# Patient Record
Sex: Female | Born: 1935 | Race: White | Hispanic: No | Marital: Married | Smoking: Never smoker
Health system: Southern US, Community
[De-identification: ages and names within clinical notes are randomized; demographics above are authoritative.]

## PROBLEM LIST (undated history)

## (undated) DIAGNOSIS — Z8719 Personal history of other diseases of the digestive system: Secondary | ICD-10-CM

## (undated) DIAGNOSIS — M1990 Unspecified osteoarthritis, unspecified site: Secondary | ICD-10-CM

## (undated) DIAGNOSIS — E785 Hyperlipidemia, unspecified: Secondary | ICD-10-CM

## (undated) DIAGNOSIS — K449 Diaphragmatic hernia without obstruction or gangrene: Secondary | ICD-10-CM

## (undated) DIAGNOSIS — R05 Cough: Secondary | ICD-10-CM

## (undated) DIAGNOSIS — R059 Cough, unspecified: Secondary | ICD-10-CM

## (undated) DIAGNOSIS — K219 Gastro-esophageal reflux disease without esophagitis: Secondary | ICD-10-CM

## (undated) DIAGNOSIS — Q2730 Arteriovenous malformation, site unspecified: Secondary | ICD-10-CM

## (undated) DIAGNOSIS — Z87448 Personal history of other diseases of urinary system: Secondary | ICD-10-CM

## (undated) DIAGNOSIS — K5790 Diverticulosis of intestine, part unspecified, without perforation or abscess without bleeding: Secondary | ICD-10-CM

## (undated) DIAGNOSIS — K253 Acute gastric ulcer without hemorrhage or perforation: Secondary | ICD-10-CM

## (undated) DIAGNOSIS — Z860100 Personal history of colon polyps, unspecified: Secondary | ICD-10-CM

## (undated) DIAGNOSIS — I10 Essential (primary) hypertension: Secondary | ICD-10-CM

## (undated) DIAGNOSIS — D509 Iron deficiency anemia, unspecified: Secondary | ICD-10-CM

## (undated) DIAGNOSIS — K648 Other hemorrhoids: Secondary | ICD-10-CM

## (undated) DIAGNOSIS — Z8639 Personal history of other endocrine, nutritional and metabolic disease: Secondary | ICD-10-CM

## (undated) DIAGNOSIS — K440 Diaphragmatic hernia with obstruction, without gangrene: Principal | ICD-10-CM

## (undated) HISTORY — DX: Diaphragmatic hernia without obstruction or gangrene: K44.9

## (undated) HISTORY — DX: Essential (primary) hypertension: I10

## (undated) HISTORY — DX: Other hemorrhoids: K64.8

## (undated) HISTORY — DX: Diaphragmatic hernia with obstruction, without gangrene: K44.0

## (undated) HISTORY — PX: OTHER SURGICAL HISTORY: SHX169

## (undated) HISTORY — PX: DILATION AND CURETTAGE OF UTERUS: SHX78

## (undated) HISTORY — DX: Iron deficiency anemia, unspecified: D50.9

## (undated) HISTORY — DX: Arteriovenous malformation, site unspecified: Q27.30

## (undated) HISTORY — PX: POLYPECTOMY: SHX149

## (undated) HISTORY — DX: Gastro-esophageal reflux disease without esophagitis: K21.9

## (undated) HISTORY — DX: Cough, unspecified: R05.9

## (undated) HISTORY — DX: Diverticulosis of intestine, part unspecified, without perforation or abscess without bleeding: K57.90

## (undated) HISTORY — DX: Acute gastric ulcer without hemorrhage or perforation: K25.3

## (undated) HISTORY — DX: Cough: R05

---

## 1999-10-12 ENCOUNTER — Encounter: Admission: RE | Admit: 1999-10-12 | Discharge: 1999-10-12 | Payer: Self-pay | Admitting: Family Medicine

## 2000-07-07 ENCOUNTER — Other Ambulatory Visit: Admission: RE | Admit: 2000-07-07 | Discharge: 2000-07-07 | Payer: Self-pay | Admitting: Family Medicine

## 2001-05-17 ENCOUNTER — Encounter: Payer: Self-pay | Admitting: Family Medicine

## 2001-05-17 ENCOUNTER — Encounter: Admission: RE | Admit: 2001-05-17 | Discharge: 2001-05-17 | Payer: Self-pay | Admitting: Family Medicine

## 2001-10-26 ENCOUNTER — Encounter: Payer: Self-pay | Admitting: Family Medicine

## 2001-10-26 ENCOUNTER — Encounter: Admission: RE | Admit: 2001-10-26 | Discharge: 2001-10-26 | Payer: Self-pay | Admitting: Family Medicine

## 2002-01-18 ENCOUNTER — Encounter: Payer: Self-pay | Admitting: Gastroenterology

## 2003-12-09 ENCOUNTER — Other Ambulatory Visit: Admission: RE | Admit: 2003-12-09 | Discharge: 2003-12-09 | Payer: Self-pay | Admitting: Family Medicine

## 2004-11-09 ENCOUNTER — Ambulatory Visit: Payer: Self-pay | Admitting: Oncology

## 2006-02-03 ENCOUNTER — Ambulatory Visit: Payer: Self-pay | Admitting: Gastroenterology

## 2006-03-01 ENCOUNTER — Ambulatory Visit: Payer: Self-pay | Admitting: Gastroenterology

## 2006-05-04 ENCOUNTER — Ambulatory Visit: Payer: Self-pay | Admitting: Gastroenterology

## 2006-06-01 ENCOUNTER — Encounter: Payer: Self-pay | Admitting: Gastroenterology

## 2007-01-27 ENCOUNTER — Other Ambulatory Visit: Admission: RE | Admit: 2007-01-27 | Discharge: 2007-01-27 | Payer: Self-pay | Admitting: Family Medicine

## 2007-12-04 ENCOUNTER — Ambulatory Visit: Payer: Self-pay | Admitting: Gastroenterology

## 2007-12-04 LAB — CONVERTED CEMR LAB
Basophils Absolute: 0 10*3/uL (ref 0.0–0.1)
Basophils Relative: 0.8 % (ref 0.0–1.0)
Eosinophils Absolute: 0.1 10*3/uL (ref 0.0–0.6)
Eosinophils Relative: 1.7 % (ref 0.0–5.0)
HCT: 38.6 % (ref 36.0–46.0)
Hemoglobin: 13.5 g/dL (ref 12.0–15.0)
Lymphocytes Relative: 30 % (ref 12.0–46.0)
MCHC: 34.9 g/dL (ref 30.0–36.0)
MCV: 90.1 fL (ref 78.0–100.0)
Monocytes Absolute: 0.7 10*3/uL (ref 0.2–0.7)
Monocytes Relative: 13.8 % — ABNORMAL HIGH (ref 3.0–11.0)
Neutro Abs: 2.7 10*3/uL (ref 1.4–7.7)
Neutrophils Relative %: 53.7 % (ref 43.0–77.0)
Platelets: 270 10*3/uL (ref 150–400)
RBC: 4.28 M/uL (ref 3.87–5.11)
RDW: 12.1 % (ref 11.5–14.6)
WBC: 5 10*3/uL (ref 4.5–10.5)

## 2007-12-27 DIAGNOSIS — D509 Iron deficiency anemia, unspecified: Secondary | ICD-10-CM

## 2007-12-27 DIAGNOSIS — D649 Anemia, unspecified: Secondary | ICD-10-CM

## 2007-12-27 DIAGNOSIS — K219 Gastro-esophageal reflux disease without esophagitis: Secondary | ICD-10-CM | POA: Insufficient documentation

## 2007-12-27 DIAGNOSIS — Q279 Congenital malformation of peripheral vascular system, unspecified: Secondary | ICD-10-CM | POA: Insufficient documentation

## 2007-12-27 DIAGNOSIS — M810 Age-related osteoporosis without current pathological fracture: Secondary | ICD-10-CM | POA: Insufficient documentation

## 2007-12-27 DIAGNOSIS — I10 Essential (primary) hypertension: Secondary | ICD-10-CM

## 2007-12-27 DIAGNOSIS — K648 Other hemorrhoids: Secondary | ICD-10-CM | POA: Insufficient documentation

## 2009-04-21 ENCOUNTER — Telehealth: Payer: Self-pay | Admitting: Gastroenterology

## 2009-05-30 DIAGNOSIS — Z860100 Personal history of colon polyps, unspecified: Secondary | ICD-10-CM | POA: Insufficient documentation

## 2009-05-30 DIAGNOSIS — K5730 Diverticulosis of large intestine without perforation or abscess without bleeding: Secondary | ICD-10-CM | POA: Insufficient documentation

## 2009-06-02 ENCOUNTER — Ambulatory Visit: Payer: Self-pay | Admitting: Gastroenterology

## 2009-06-02 DIAGNOSIS — K5520 Angiodysplasia of colon without hemorrhage: Secondary | ICD-10-CM | POA: Insufficient documentation

## 2009-06-02 LAB — CONVERTED CEMR LAB
Basophils Absolute: 0 10*3/uL (ref 0.0–0.1)
Basophils Relative: 0.6 % (ref 0.0–3.0)
Eosinophils Absolute: 0.1 10*3/uL (ref 0.0–0.7)
Eosinophils Relative: 1.7 % (ref 0.0–5.0)
HCT: 38.1 % (ref 36.0–46.0)
Hemoglobin: 13.3 g/dL (ref 12.0–15.0)
Lymphocytes Relative: 25.3 % (ref 12.0–46.0)
Lymphs Abs: 1.6 10*3/uL (ref 0.7–4.0)
MCHC: 35 g/dL (ref 30.0–36.0)
MCV: 84.9 fL (ref 78.0–100.0)
Monocytes Absolute: 0.7 10*3/uL (ref 0.1–1.0)
Monocytes Relative: 10.6 % (ref 3.0–12.0)
Neutro Abs: 3.9 10*3/uL (ref 1.4–7.7)
Neutrophils Relative %: 61.8 % (ref 43.0–77.0)
Platelets: 283 10*3/uL (ref 150.0–400.0)
RBC: 4.48 M/uL (ref 3.87–5.11)
RDW: 13.3 % (ref 11.5–14.6)
WBC: 6.3 10*3/uL (ref 4.5–10.5)

## 2010-09-23 ENCOUNTER — Ambulatory Visit: Payer: Self-pay | Admitting: Gastroenterology

## 2010-09-23 DIAGNOSIS — K5900 Constipation, unspecified: Secondary | ICD-10-CM | POA: Insufficient documentation

## 2011-01-26 NOTE — Assessment & Plan Note (Signed)
Summary: med refills--ch.   History of Present Illness Visit Type: Follow-up Visit Primary GI MD: Elie Goody MD [facility] Primary Provider: Cam Hai, MD Chief Complaint: GERD, constipation, refill-omeprazole History of Present Illness:   This is a 77 year old female who has had chronic acid reflux problems that are well controlled on omeprazole. She discontinued medication and her symptoms returned. She has had mild constipation for several months. No change in stool caliber or bleeding. She underwent colonoscopy in 2007 that showed diverticulosis, hemorrhoids, and angiodysplasia.   GI Review of Systems    Reports acid reflux.      Denies abdominal pain, belching, bloating, chest pain, dysphagia with liquids, dysphagia with solids, heartburn, loss of appetite, nausea, vomiting, vomiting blood, weight loss, and  weight gain.      Reports change in bowel habits and  constipation.     Denies anal fissure, black tarry stools, diarrhea, diverticulosis, fecal incontinence, heme positive stool, hemorrhoids, irritable bowel syndrome, jaundice, light color stool, liver problems, rectal bleeding, and  rectal pain.   Current Medications (verified): 1)  Omeprazole 20 Mg Cpdr (Omeprazole) .... One Tablet By Mouth Once Daily 2)  Hydrochlorothiazide 25 Mg Tabs (Hydrochlorothiazide) .... Take 1 Tablet By Mouth Once A Day 3)  Metoprolol Tartrate 50 Mg Tabs (Metoprolol Tartrate) .... Take 1 Tablet By Mouth Once A Day 4)  Perphenazine-Amitriptyline 2-10 Mg Tabs (Perphenazine-Amitriptyline) .... Take 1 Tablet By Mouth Once A Day 5)  Multivitamins  Tabs (Multiple Vitamin) .... Take 1 Tablet By Mouth Once A Day 6)  Fish Oil 500 Mg Caps (Omega-3 Fatty Acids) .... Take 2 Tablets By Mouth Once Daily 7)  Simvastatin 20 Mg Tabs (Simvastatin) .... Once Daily  Allergies (verified): No Known Drug Allergies  Past History:  Past Medical History: Reviewed history from 06/02/2009 and no changes  required. DIVERTICULOSIS, COLON (ICD-562.10) OSTEOPOROSIS (ICD-733.00) HYPERTENSION (ICD-401.9) ANEMIA, IRON DEFICIENCY (ICD-280.9) CAMERON EROSIONS COLONIC ARTERIOVENOUS MALFORMATION  GERD (ICD-530.81) HEMORRHOIDS, INTERNAL (ICD-455.0) COLONIC POLYPS, HYPERPLASTIC, HX OF (ICD-V12.72)      Past Surgical History: Reviewed history from 06/02/2009 and no changes required. C-section  Family History: Reviewed history from 06/02/2009 and no changes required. No FH of Colon Cancer: Family History of Prostate Cancer: Brother  Social History: Reviewed history from 06/02/2009 and no changes required. Married Patient has never smoked.  Alcohol Use - yes-1-3 glasses daily Daily Caffeine Use-2 cups daily Illicit Drug Use - no Patient does not get regular exercise.   Review of Systems       The pertinent positives and negatives are noted as above and in the HPI. All other ROS were reviewed and were negative.   Vital Signs:  Patient profile:   77 year old female Height:      61.5 inches Weight:      157 pounds BMI:     29.29 Pulse rate:   84 / minute Pulse rhythm:   regular BP sitting:   124 / 78  (left arm) Cuff size:   regular  Vitals Entered By: June McMurray CMA Duncan Dull) (September 23, 2010 10:18 AM)  Physical Exam  General:  Well developed, well nourished, no acute distress. Head:  Normocephalic and atraumatic. Eyes:  PERRLA, no icterus. Mouth:  No deformity or lesions, dentition normal. Lungs:  Clear throughout to auscultation. Heart:  Regular rate and rhythm; no murmurs, rubs,  or bruits. Abdomen:  Soft, nontender and nondistended. No masses, hepatosplenomegaly or hernias noted. Normal bowel sounds. Psych:  Alert and cooperative. Normal mood and affect.  Impression & Recommendations:  Problem # 1:  GERD (ICD-530.81) Continue omeprazole 20 mg p.o. q.a.m. and standard antireflux measures. Advised her that she may have her primary care physician refill her omeprazole  prescription in lieu of annual office visits with me.  Problem # 2:  CONSTIPATION (ICD-564.00) Increase daily water and fiber intake. Consider the use of a stool softener and fiber supplement as needed. If her constipation does not respond to the above measures, she is advised to contact my office.  Patient Instructions: 1)  Pick up your prescription from your pharmacy.  2)  High Fiber, Low Fat  Healthy Eating Plan brochure given.  3)  Copy sent to : Cam Hai, MD 4)  The medication list was reviewed and reconciled.  All changed / newly prescribed medications were explained.  A complete medication list was provided to the patient / caregiver.  Prescriptions: OMEPRAZOLE 20 MG CPDR (OMEPRAZOLE) one tablet by mouth once daily  #30 x 11   Entered by:   Christie Nottingham CMA (AAMA)   Authorized by:   Meryl Dare MD [facility]   Signed by:   Christie Nottingham CMA (AAMA) on 09/23/2010   Method used:   Electronically to        [id number]       [address] [place], [place]  [address]       Ph: [phone]       Fax: [phone]   RxID:   [id number]

## 2011-04-18 ENCOUNTER — Emergency Department (HOSPITAL_COMMUNITY): Payer: Medicare Other

## 2011-04-18 ENCOUNTER — Inpatient Hospital Stay (HOSPITAL_COMMUNITY)
Admission: EM | Admit: 2011-04-18 | Discharge: 2011-04-21 | DRG: 392 | Disposition: A | Discharge status: Home / Self Care | Payer: Medicare Other | Attending: Internal Medicine | Admitting: Internal Medicine

## 2011-04-18 DIAGNOSIS — M810 Age-related osteoporosis without current pathological fracture: Secondary | ICD-10-CM | POA: Diagnosis present

## 2011-04-18 DIAGNOSIS — A09 Infectious gastroenteritis and colitis, unspecified: Principal | ICD-10-CM | POA: Diagnosis present

## 2011-04-18 DIAGNOSIS — Z860100 Personal history of colon polyps, unspecified: Secondary | ICD-10-CM

## 2011-04-18 DIAGNOSIS — Z79899 Other long term (current) drug therapy: Secondary | ICD-10-CM

## 2011-04-18 DIAGNOSIS — E876 Hypokalemia: Secondary | ICD-10-CM | POA: Diagnosis present

## 2011-04-18 DIAGNOSIS — K449 Diaphragmatic hernia without obstruction or gangrene: Secondary | ICD-10-CM | POA: Diagnosis present

## 2011-04-18 DIAGNOSIS — K648 Other hemorrhoids: Secondary | ICD-10-CM | POA: Diagnosis present

## 2011-04-18 DIAGNOSIS — K5730 Diverticulosis of large intestine without perforation or abscess without bleeding: Secondary | ICD-10-CM | POA: Diagnosis present

## 2011-04-18 DIAGNOSIS — F17200 Nicotine dependence, unspecified, uncomplicated: Secondary | ICD-10-CM | POA: Diagnosis present

## 2011-04-18 DIAGNOSIS — T39095A Adverse effect of salicylates, initial encounter: Secondary | ICD-10-CM | POA: Diagnosis present

## 2011-04-18 DIAGNOSIS — E785 Hyperlipidemia, unspecified: Secondary | ICD-10-CM | POA: Diagnosis present

## 2011-04-18 DIAGNOSIS — R7301 Impaired fasting glucose: Secondary | ICD-10-CM | POA: Diagnosis present

## 2011-04-18 DIAGNOSIS — D62 Acute posthemorrhagic anemia: Secondary | ICD-10-CM | POA: Diagnosis present

## 2011-04-18 DIAGNOSIS — K219 Gastro-esophageal reflux disease without esophagitis: Secondary | ICD-10-CM | POA: Diagnosis present

## 2011-04-18 DIAGNOSIS — R319 Hematuria, unspecified: Secondary | ICD-10-CM | POA: Diagnosis present

## 2011-04-18 DIAGNOSIS — I10 Essential (primary) hypertension: Secondary | ICD-10-CM | POA: Diagnosis present

## 2011-04-18 LAB — CBC
HCT: 42 % (ref 36.0–46.0)
Hemoglobin: 13.8 g/dL (ref 12.0–15.0)
MCV: 80.9 fL (ref 78.0–100.0)
RBC: 5.19 MIL/uL — ABNORMAL HIGH (ref 3.87–5.11)
WBC: 18.7 10*3/uL — ABNORMAL HIGH (ref 4.0–10.5)

## 2011-04-18 LAB — ABO/RH: ABO/RH(D): A POS

## 2011-04-18 LAB — URINALYSIS, ROUTINE W REFLEX MICROSCOPIC
Glucose, UA: NEGATIVE mg/dL
Nitrite: NEGATIVE
Protein, ur: NEGATIVE mg/dL
pH: 5.5 (ref 5.0–8.0)

## 2011-04-18 LAB — COMPREHENSIVE METABOLIC PANEL
Albumin: 3.8 g/dL (ref 3.5–5.2)
Alkaline Phosphatase: 80 U/L (ref 39–117)
BUN: 18 mg/dL (ref 6–23)
CO2: 26 mEq/L (ref 19–32)
Chloride: 98 mEq/L (ref 96–112)
Glucose, Bld: 160 mg/dL — ABNORMAL HIGH (ref 70–99)
Potassium: 3.3 mEq/L — ABNORMAL LOW (ref 3.5–5.1)
Total Bilirubin: 0.7 mg/dL (ref 0.3–1.2)

## 2011-04-18 LAB — PROTIME-INR
INR: 1.1 (ref 0.00–1.49)
Prothrombin Time: 14.4 seconds (ref 11.6–15.2)

## 2011-04-18 LAB — OCCULT BLOOD, POC DEVICE: Fecal Occult Bld: POSITIVE

## 2011-04-18 LAB — DIFFERENTIAL
Lymphocytes Relative: 3 % — ABNORMAL LOW (ref 12–46)
Lymphs Abs: 0.6 10*3/uL — ABNORMAL LOW (ref 0.7–4.0)
Neutrophils Relative %: 90 % — ABNORMAL HIGH (ref 43–77)

## 2011-04-18 LAB — URINE MICROSCOPIC-ADD ON

## 2011-04-19 ENCOUNTER — Encounter (HOSPITAL_COMMUNITY): Payer: Self-pay

## 2011-04-19 LAB — BASIC METABOLIC PANEL
CO2: 26 mEq/L (ref 19–32)
Calcium: 7.7 mg/dL — ABNORMAL LOW (ref 8.4–10.5)
Glucose, Bld: 208 mg/dL — ABNORMAL HIGH (ref 70–99)
Sodium: 131 mEq/L — ABNORMAL LOW (ref 135–145)

## 2011-04-19 LAB — HEMOGLOBIN AND HEMATOCRIT, BLOOD
HCT: 35.7 % — ABNORMAL LOW (ref 36.0–46.0)
Hemoglobin: 11.6 g/dL — ABNORMAL LOW (ref 12.0–15.0)

## 2011-04-19 LAB — HEMOGLOBIN A1C
Hgb A1c MFr Bld: 6.1 % — ABNORMAL HIGH (ref <5.7)
Mean Plasma Glucose: 128 mg/dL — ABNORMAL HIGH (ref <117)

## 2011-04-19 LAB — CBC
HCT: 36.9 % (ref 36.0–46.0)
Hemoglobin: 11.8 g/dL — ABNORMAL LOW (ref 12.0–15.0)
MCHC: 32 g/dL (ref 30.0–36.0)

## 2011-04-19 MED ORDER — IOHEXOL 300 MG/ML  SOLN
100.0000 mL | Freq: Once | INTRAMUSCULAR | Status: AC | PRN
  Administered 2011-04-19: 100 mL via INTRAVENOUS

## 2011-04-20 LAB — BASIC METABOLIC PANEL
GFR calc Af Amer: >60 mL/min (ref >60)
GFR calc non Af Amer: >60 mL/min (ref >60)
Potassium: 3.5 mEq/L (ref 3.5–5.1)
Sodium: 138 mEq/L (ref 135–145)

## 2011-04-20 LAB — CBC
HCT: 34.2 % — ABNORMAL LOW (ref 36.0–46.0)
Platelets: 224 10*3/uL (ref 150–400)
RDW: 14.9 % (ref 11.5–15.5)
WBC: 16 10*3/uL — ABNORMAL HIGH (ref 4.0–10.5)

## 2011-04-20 NOTE — H&P (Signed)
NAMEMCCALL, WILL             ACCOUNT NO.:  [id number]  MEDICAL RECORD NO.:  [id number]           PATIENT TYPE:  I  LOCATION:  1310                         FACILITY:  [facility]  PHYSICIAN:  Hillery Aldo, M.D.   DATE OF BIRTH:  1932-01-23  DATE OF ADMISSION:  04/18/2011 DATE OF DISCHARGE:                             HISTORY & PHYSICAL   PRIMARY CARE PHYSICIAN:  Dr. Cam Hai  GASTROENTEROLOGIST:  Venita Lick. Russella Dar, MD, [facility]  CHIEF COMPLAINTS:  Abdominal pain, loose stools, nausea.  HISTORY OF PRESENT ILLNESS:  The patient is a 77 year old female who was in her usual state of health until shortly after breakfast this morning. The patient notes that she had Eggs Benedict at [employer] and shortly thereafter developed crampy abdominal pain and profuse diarrhea.  The patient states that she has had 10-12 episodes of watery stools and most recently her stools have been bloody.  She apparently went to the [facility]-in [facility] earlier today and they advised her to come into the emergency department for further evaluation.  She denies any fever or chills.  There has not been any sick contacts.  She has been having dry heaving, but no frank vomiting.  With regard to her abdominal cramps, they are rated 7/10 at the worst and having bowel movements eases the pain temporarily.  Nothing specific worsens the pain.  Upon initial evaluation in the emergency department, the patient is found to have frank blood in the stools and leukocytosis and she is therefore being referred for inpatient evaluation.  PAST MEDICAL HISTORY: 1. Osteoporosis. 2. History of internal hemorrhoids. 3. History of hyperplastic colonic polyps. 4. Hypertension. 5. Gastroesophageal reflux disease. 6. Dyslipidemia. 7. History of iron deficiency anemia secondary to Cameron erosions and     AVMs. 8. Diverticulosis.  PAST SURGICAL HISTORY:  Cesarean section.  FAMILY HISTORY:  The patient's mother died at 47  from old age. According to the patient, she developed kidney problems that led to her demise.  The patient's father died at 34 from leukemia.  She has a brother who died from a lung infection in the setting of a known history of prostate cancer.  She has a sister who died of complications of Alzheimer disease.  She has a brother who is alive and well.  SOCIAL HISTORY:  The patient is married and lives with her husband. He can be reached at [phone] in case of emergency.  She is a lifelong nonsmoker.  She drinks wine, approximately 1-2 glasses per day.  She denies any drug use.  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  ALLERGIES:  No known drug allergies.  CURRENT MEDICATIONS:  The patient is unable to state her current medications and these will need to be further verified.  [facility] [address]  REVIEW OF SYSTEMS:  CONSTITUTIONAL:  No fever or chills.  No weight changes.  Appetite has been normal up until she became sick.  HEENT:  No complaints.  CARDIOVASCULAR:  No chest pain or palpitations. RESPIRATORY:  Shortness of breath or cough.  GI:  As per the elements of the HPI above.  She also has significant reflux-type symptoms and a burning sensation on the tongue.  GU:  No dysuria or hematuria.  Comprehensive 14-point review of systems is otherwise unremarkable.  PHYSICAL EXAMINATION:  VITAL SIGNS:  Temperature 97.8, pulse 88, respirations 20, blood pressure 141/69, O2 saturation 95% on room air. GENERAL:  A well-developed, well-nourished Caucasian female in no acute distress. HEENT:  Normocephalic, atraumatic.  PERRLA.  EOMI.  Oropharynx is clear. Mucous membranes are moist. NECK:  Supple.  No thyromegaly.  No lymphadenopathy.  No jugular venous distention. CHEST:  Lungs clear to auscultation bilaterally with good air movement. HEART:  Regular rate, rhythm.  No murmurs, rubs, or gallops. ABDOMEN:  Soft, slightly distended.  She is tender about the left  lower quadrant, but no guarding or rebound.  Hyperactive bowel sounds. RECTAL:  Deferred because it was done by the ED physician with reported grossly positive stool. EXTREMITIES:  1+ edema bilaterally. SKIN:  Warm and dry.  No rashes. NEUROLOGIC:  The patient is alert and oriented x3.  Cranial nerves II through XII grossly intact.  Nonfocal.  DATA REVIEW:  Fecal occult blood testing was positive.  White blood cell count was 18.7, hemoglobin 13.8, hematocrit 42, platelets 266.  Sodium is 135, potassium 3.3, chloride 98, bicarb 26, BUN 18, creatinine 0.96, glucose 160, calcium 9.1.  Total bilirubin 0.7, alkaline phosphatase 80, AST 42, ALT 27, total protein 7.9, albumin 3.8.  PT is 14.4.  Urinalysis is negative for nitrites with a small amount of leukocytes (0-2).  There are 21-50 red blood cells.  ASSESSMENT/PLAN: 1. Probable diverticulitis:  The patient likely has diverticulitis,     although acute food poisoning is also in the differential.  At this     point, would put her empirically on Cipro and Flagyl and obtain a     CT scan of the abdomen and pelvis for diagnostic purposes.  We will     treat her symptomatically with antiemetics and pain medications.     Would start her on a PPI and clear liquid diet. 2. Hematuria:  May be contamination from fecal blood.  We will check a     urine culture, but she is on Cipro, which would cover any urinary     tract infection. 3. Hypokalemia:  Replace the patient's potassium and her IV fluids and     check a magnesium level in the morning. 4. Hyperglycemia:  Check a fasting glucose level in the morning and a     hemoglobin A1c. 5. Hypertension:  We will hold the patient's antihypertensives until     these can be verified and significant gastrointestinal bleeding has     been ruled out. 6. Rectal bleeding:  We will place the patient on proton pump     inhibitor therapy and monitor her hemoglobin and hematocrit q.8 h.     x3.  Consider  consulting Dr. Russella Dar in the morning if she does     continue to have rectal bleeding. 7. Dyslipidemia:  The patient's statin is currently on hold until we     can verify her medication and dosage. 8. Gastroesophageal reflux disease:  Again, we will place the patient     on IV proton pump inhibitor therapy. 9. Prophylaxis:  Use PAS hoses for deep venous thrombosis prophylaxis,     given the patient's active gastrointestinal bleeding.  Time spent on admission including face-to-face time approximately 1 hour.     Hillery Aldo, M.D.     CR/MEDQ  D:  04/18/2011  T:  04/19/2011  Job:  [id number]  cc:   Dr. Aura Dials. Russella Dar, MD, FACG [address] [place] [address]  Electronically Signed by Hillery Aldo M.D. on 04/20/2011 04:06:23 PM

## 2011-04-21 LAB — BASIC METABOLIC PANEL
CO2: 27 mEq/L (ref 19–32)
GFR calc non Af Amer: >60 mL/min (ref >60)
Glucose, Bld: 96 mg/dL (ref 70–99)
Potassium: 3.4 mEq/L — ABNORMAL LOW (ref 3.5–5.1)
Sodium: 138 mEq/L (ref 135–145)

## 2011-04-21 LAB — CBC
HCT: 30.7 % — ABNORMAL LOW (ref 36.0–46.0)
Hemoglobin: 9.8 g/dL — ABNORMAL LOW (ref 12.0–15.0)
WBC: 10.4 10*3/uL (ref 4.0–10.5)

## 2011-04-27 NOTE — Discharge Summary (Signed)
NAMEJOCI, DRESS             ACCOUNT NO.:  [id number]  MEDICAL RECORD NO.:  [id number]           PATIENT TYPE:  I  LOCATION:  1310                         [facility]ACILITY:  [facility]  PHYSICIAN:  Hillery Aldo, M.D.   DATE OF BIRTH:  13-Mar-1932  DATE OF ADMISSION:  04/18/2011 DATE OF DISCHARGE:  04/21/2011                              DISCHARGE SUMMARY   PRIMARY CARE PHYSICIAN:  Lupita Raider, M.D.  GASTROENTEROLOGIST:  Venita Lick. Russella Dar, MD, [facility]  DISCHARGE DIAGNOSES: 1. Colitis. 2. Rectal bleeding with acute blood loss anemia. 3. Hematuria. 4. Hypokalemia. 5. Hyperglycemia. 6. Hypertension. 7. Dyslipidemia. 8. Gastroesophageal reflux disease. 9. Large paraesophageal hernia.  DISCHARGE MEDICATIONS.: 1. Cipro 500 mg p.o. q.12 h x10 days. 2. Flagyl 500 mg p.o. q.8 h x10 days. 3. Metoprolol succinate 50 mg p.o. daily. 4. Perphenazine/amitriptyline 2/10 mg 2 tablets p.o. q.h.s. 5. Hydrochlorothiazide 25 mg 1/2 tablet p.o. daily. 6. Simvastatin 20 mg p.o. q.h.s. 7. Omeprazole 20 mg p.o. daily. 8. Potassium chloride 20 mEq p.o. daily.  Note:  The patient was instructed to hold her aspirin until there is no evidence of any further rectal bleeding and she follows up with Dr. Clelia Croft for a repeat check of her blood counts.  CONSULTATIONS:  None.  BRIEF ADMISSION HPI:  The patient is a 77 year old female who presented to the hospital with the acute onset of abdominal pain accompanied by diarrhea and nausea.  Upon initial evaluation in the emergency department, the patient was thought to have colitis versus diverticulitis.  She subsequently was referred to the hospitalist service for further evaluation and treatment.  For full details, please see my dictated report.  PROCEDURES AND DIAGNOSTIC STUDIES: 1. CT scan of the abdomen and pelvis on April 19, 2011, showed colitis     involving the descending and proximal sigmoid colon with diffuse     wall thickening and soft tissue  stranding and trace fluid in the     pelvis which could be infectious, inflammatory, or ischemic in     nature.  Diverticulosis along the sigmoid colon without any     evidence for diverticulitis.  A large paraesophageal hernia filled     with contrast and air.  Multiple prominent right renal cysts.  Mild     partial consolidation of both lower lobes reflective of the     patient's paraesophageal hernia.  Mild degenerative changes noted     along the lumbar spine.  DISCHARGE LABORATORY VALUES:  Sodium was 138, potassium 3.4 (repleted with 40 mEq prior to discharge), chloride 107, bicarb 27, BUN 4, creatinine 0.74, glucose 96, calcium 7.8.  White blood cell count was 10.4, hemoglobin 9.8, hematocrit 30.7, platelets 196.  Hemoglobin A1c was 6.1.  Clostridium difficile by PCR was negative.  Liver function studies done on admission were within normal limits.  HOSPITAL COURSE BY PROBLEM: 1. Colitis:  The patient's colitis was felt to be infectious in     etiology given her leukocytosis.  She was put on IV Cipro and     Flagyl as well as bowel rest.  With improvement in her clinical     presentation,  her diet was advanced and she was changed over to     p.o. Cipro and Flagyl.  CT scan was done which confirmed the     diagnosis of colitis.  At this point, the patient has completed 4     out of a planned course of 14 days of Cipro and Flagyl.  She is     encouraged to follow up with her primary care physician as well as     with Dr. Russella Dar for nonresolution of symptoms if appropriate. 2. Rectal bleeding/acute blood loss anemia:  The patient did have some     mild complaints of rectal bleeding.  Her admission hemoglobin was     within the normal range at 13.8 and over the course of her hospital     stay, she has trended down to her current hemoglobin of 9.8.  This     4 g drop since admission was felt to be due to GI losses in the     setting of recent aspirin exposure as well as a component  of     dilutional factors.  At this point, the patient reports that her     stools have become less bloody and have decreased in frequency and     volume.  She is asymptomatic and has not needed a transfusion.  At     this point, it is safe to discharge her with a followup CBC done in     her physician's office in the next week. 3. Hematuria:  Although urine cultures were requested, they were never     sent to rule out a UTI.  Cipro should cover urinary pathogens at     present.  At this point, would simply have her primary care     physician repeat a urinalysis when she is fully recovered from her     colitis as the hematuria may have been contamination from the     stool. 4. Hypokalemia:  The patient is on hydrochlorothiazide at baseline and     with her ongoing potential GI issues, would start her on routine     supplementation therapy.  She will be discharged on 20 mEq daily     and repleted with 40 mEq prior to discharge. 5. Hyperglycemia/impaired fasting glucose:  The patient did have a     hemoglobin A1c drawn due to her elevated blood glucoses.  She did     have an elevated hemoglobin A1c at 6.1% which corresponds for mean     plasma glucose of 128 indicating that she does have impaired     fasting glucose.  She will need close followup to ensure she does     not develop signs of overt diabetes. 6. Hypertension:  The patient's blood pressure is currently stable and     her home medicines have been resumed. 7. Dyslipidemia:  Statin has initially on hold.  It was resumed once     she was able to take p.o.'s. 8. Gastroesophageal reflux disease/paraesophageal hernia:  The patient     was maintained on proton pump inhibitor therapy.  DISPOSITION:  The patient is medically stable and will be discharged home with encouragement to follow up with her primary care physician in 5 days.  DISCHARGE DIET:  Low concentrated sweets, heart-healthy.  CONDITION ON DISCHARGE:   Improved.  Time spent coordinating care for discharge and discharge instructions equals 35 minutes.     Hillery Aldo, M.D.  CR/MEDQ  D:  04/21/2011  T:  04/21/2011  Job:  [id number]  cc:   Lupita Raider, M.D. Fax: [phone]  Venita Lick. Russella Dar, MD, FACG [address] [place] [address]  Electronically Signed by Hillery Aldo M.D. on 04/27/2011 07:00:28 AM

## 2011-05-11 NOTE — Assessment & Plan Note (Signed)
[facility]                         GASTROENTEROLOGY OFFICE NOTE   NAME:[name], Brooke HAPKE                    MRN:          [id number]  DATE:12/04/2007                            DOB:          1932/04/10    This is a return office visit for GERD and a history of iron-deficiency  anemia.  She notes a nocturnal cough that generally occurs upon  recumbency.  She has not been using bed blocks and she frequently eats  late in the evening.  It worsened when she was recently on vacation in  the [place].  She notes no dysphagia, odynophagia, heartburn, abdominal  pain, chest pain, melena or hematochezia.  She notes her stools are  occasionally dark brown but no black or tarry stools.  She has a history  of iron-deficiency anemia secondary to [facility] erosions and AVMs.  She  has not recently had a CBC.   CURRENT MEDICATIONS:  Listed on the chart, updated and reviewed.   ALLERGIES:  No known drug allergies.   PHYSICAL EXAMINATION:  VITAL SIGNS:  Weight 157.4 pounds, blood pressure  124/74, pulse 72 and regular.  GENERAL APPEARANCE:  No acute distress.  CHEST:  Clear to auscultation bilaterally.  CARDIOVASCULAR:  Regular rate and rhythm without murmurs.  ABDOMEN:  Soft and nontender with normal active bowel sounds.   ASSESSMENT/PLAN:  1. Gastroesophageal reflux disease with a frequent recumbent cough      which I suspect she is reflux related.  She is advised to begin the      use of four inch bed blocks and to avoid any food or drink for      three to four hours before bedtime.  Omeprazole 40 mg p.o. q.a.m.      long-term.  2. History of iron-deficiency anemia.  Obtain CBC today.  I would      suggest that she have CBCs performed routinely every six months by      her primary care physician, with the expectation that she could      likely have a recurrent iron deficiency.  Return office visit with      me in 12 months.     Venita Lick. Russella Dar, MD, [facility]  Electronically Signed    MTS/MedQ  DD: 12/04/2007  DT: 12/04/2007  Job #: [id number]   cc:   Donia Guiles, M.D.

## 2011-08-23 ENCOUNTER — Encounter: Payer: Self-pay | Admitting: Gastroenterology

## 2011-08-23 ENCOUNTER — Ambulatory Visit (INDEPENDENT_AMBULATORY_CARE_PROVIDER_SITE_OTHER): Payer: Medicare Other | Admitting: Gastroenterology

## 2011-08-23 VITALS — BP 112/68 | HR 78 | Ht 61.5 in | Wt 154.6 lb

## 2011-08-23 DIAGNOSIS — K219 Gastro-esophageal reflux disease without esophagitis: Secondary | ICD-10-CM

## 2011-08-23 DIAGNOSIS — R079 Chest pain, unspecified: Secondary | ICD-10-CM

## 2011-08-23 DIAGNOSIS — K5900 Constipation, unspecified: Secondary | ICD-10-CM

## 2011-08-23 NOTE — Patient Instructions (Signed)
If you decide to schedule the Upper GI series, Esophageal Manometry and Upper Endoscopy, please call our main number. Ask to speak to [facility] and we can set these tests up for you.  Stay on reflux medications. Patient advised to avoid spicy, acidic, citrus, chocolate, mints, fruit and fruit juices.  Limit the intake of caffeine, alcohol and Soda.  Don't exercise too soon after eating.  Don't lie down within 3-4 hours of eating.  Elevate the head of your bed. High Fiber diet given.  cc: Cam Hai, MD

## 2011-08-23 NOTE — Progress Notes (Signed)
History of Present Illness: This is a  77 year old female here today with her husband. She was hospitalized at [facility] for about 3 days earlier this month for severe chest pain and epigastric pain associated with nausea and vomiting. She states she had lunch at [employer] and developed severe lower sternal area chest pain and was taken by ambulance to [facility]. I do not have records except for a CT scan of the abdomen/pelvis and abdominal ultrasound. Imaging studies were remarkable for a large hiatal hernia, with most of the stomach in her chest and a left lower lobe infiltrate with a small effusion. Apparently cardiopulmonary and other etiologies were excluded and her physicians were concerned that her symptoms were due to her large hiatal hernia. She has had no chest pain since discharge. She denies any reflux symptoms or dysphagia. She has had ongoing difficulties with mild constipation that have responded well to milk of magnesia. She underwent upper endoscopy and colonoscopy in March 2007 showing a large hiatal hernia with Sheria Lang erosions on her upper endoscopy and AVMs, diverticulosis and hemorrhoids on her colonoscopy. Currently denies weight loss, abdominal pain, diarrhea, change in stool caliber, melena, hematochezia, nausea, vomiting, dysphagia, reflux symptoms, chest pain.  Past Medical History  Diagnosis Date  . Diverticulosis   . Osteoporosis   . Hypertension   . Iron deficiency anemia   . Cameron lesion, acute   . AVM (arteriovenous malformation)   . GERD (gastroesophageal reflux disease)   . Internal hemorrhoid   . Hiatal hernia   . Diverticulosis    Past Surgical History  Procedure Date  . Cesarean section     reports that she has never smoked. She does not have any smokeless tobacco history on file. She reports that she drinks alcohol. She reports that she does not use illicit drugs. family history includes Prostate cancer in her brother.  There is no  history of Colon cancer. No Known Allergies    Outpatient Encounter Prescriptions as of 08/23/2011  Medication Sig Dispense Refill  . hydrochlorothiazide 25 MG tablet Take 25 mg by mouth daily.        . metoprolol (LOPRESSOR) 50 MG tablet Take 50 mg by mouth daily.        . Multiple Vitamin (MULTIVITAMIN) tablet Take 1 tablet by mouth daily.        . Omega-3 Fatty Acids (FISH OIL) 500 MG CAPS Take 2 capsules by mouth daily.        Marland Kitchen omeprazole (PRILOSEC) 20 MG capsule Take 20 mg by mouth daily.        Marland Kitchen perphenazine-amitriptyline (ETRAFON/TRIAVIL) 2-10 MG TABS Take 2 tablets by mouth daily.       . simvastatin (ZOCOR) 20 MG tablet Take 20 mg by mouth at bedtime.         Review of Systems: Pertinent positive and negative review of systems were noted in the above HPI section. All other review of systems were otherwise negative.  Physical Exam: General: Well developed , well nourished, no acute distress Head: Normocephalic and atraumatic Eyes:  sclerae anicteric, EOMI Ears: Normal auditory acuity Mouth: No deformity or lesions Neck: Supple, no masses or thyromegaly Lungs: Clear throughout to auscultation Heart: Regular rate and rhythm; no murmurs, rubs or bruits Abdomen: Soft, non tender and non distended. No masses, hepatosplenomegaly or hernias noted. Normal Bowel sounds Musculoskeletal: Symmetrical with no gross deformities  Skin: No lesions on visible extremities Pulses:  Normal pulses noted Extremities: No clubbing, cyanosis, edema  or deformities noted Neurological: Alert oriented x 4, grossly nonfocal Cervical Nodes:  No significant cervical adenopathy Inguinal Nodes: No significant inguinal adenopathy Psychological:  Alert and cooperative. Normal mood and affect  Assessment and Recommendations:  1. Severe lower chest and epigastric pain occurring one month ago. I would like to review additional records from her hospitalization and we have requested them. I am not certain that  her large hiatal hernia was the cause of her symptoms but she could have had torsion of the hernia or a gastric volvulus. I advised proceeding with surgical consultation however she is unsure she wants to proceed at this point. After contacting [facility], we were informed that they will require an upper endoscopy, upper GI series and esophageal manometry study prior to her being evaluated for possible hiatal hernia repair. Patient will consider further workup and contact me if she decides to proceed.  2. GERD with a large hiatal hernia and a history of Cameron erosions. Continue omeprazole 20 mg daily and standard antireflux measures.  3. History of iron deficiency anemia presumed secondary to [facility] erosions and AVMs.

## 2011-09-06 ENCOUNTER — Ambulatory Visit (INDEPENDENT_AMBULATORY_CARE_PROVIDER_SITE_OTHER): Payer: Medicare Other | Admitting: Surgery

## 2011-09-13 ENCOUNTER — Telehealth: Payer: Self-pay | Admitting: Gastroenterology

## 2011-09-13 DIAGNOSIS — R1013 Epigastric pain: Secondary | ICD-10-CM

## 2011-09-13 DIAGNOSIS — K219 Gastro-esophageal reflux disease without esophagitis: Secondary | ICD-10-CM

## 2011-09-14 NOTE — Telephone Encounter (Signed)
Left a message for patient to return my call. 

## 2011-09-15 ENCOUNTER — Encounter: Payer: Self-pay | Admitting: Gastroenterology

## 2011-09-15 NOTE — Telephone Encounter (Addendum)
Spoke with patient and she states she would like to have the testing that her and Dr. Russella Dar discussed in the office done. I told her that I can schedule them all today and call her this afternoon with the appt dates and times. Pt agreed.

## 2011-09-15 NOTE — Telephone Encounter (Addendum)
Scheduled patient for UGI series on 09/17/11 at 9:30 at [facility], Esophageal Manometry on 10/04/11 at 11:30am, and a Upper Endoscopy with previsit on 10/06/11 at 2:00pm. Mailed all the information to patient regarding appt dates and times with a letter to notify her to call if she needs to reschedule or cancel any of these tests. Informed patient about test starting this Friday and for her to call back if she has any questions once she has received the packet in the mail. Pt agreed and verbalized understanding.

## 2011-09-17 ENCOUNTER — Ambulatory Visit (HOSPITAL_COMMUNITY)
Admission: RE | Admit: 2011-09-17 | Discharge: 2011-09-17 | Disposition: A | Discharge status: Home / Self Care | Payer: Medicare Other | Source: Ambulatory Visit | Attending: Gastroenterology | Admitting: Gastroenterology

## 2011-09-17 DIAGNOSIS — K224 Dyskinesia of esophagus: Secondary | ICD-10-CM | POA: Insufficient documentation

## 2011-09-17 DIAGNOSIS — R0789 Other chest pain: Secondary | ICD-10-CM | POA: Insufficient documentation

## 2011-09-17 DIAGNOSIS — K219 Gastro-esophageal reflux disease without esophagitis: Secondary | ICD-10-CM | POA: Insufficient documentation

## 2011-09-17 DIAGNOSIS — R1013 Epigastric pain: Secondary | ICD-10-CM | POA: Insufficient documentation

## 2011-09-17 DIAGNOSIS — K449 Diaphragmatic hernia without obstruction or gangrene: Secondary | ICD-10-CM | POA: Insufficient documentation

## 2011-09-29 ENCOUNTER — Ambulatory Visit (AMBULATORY_SURGERY_CENTER): Payer: Medicare Other | Admitting: *Deleted

## 2011-09-29 ENCOUNTER — Telehealth: Payer: Self-pay | Admitting: Gastroenterology

## 2011-09-29 VITALS — Ht 63.0 in | Wt 155.5 lb

## 2011-09-29 DIAGNOSIS — R1013 Epigastric pain: Secondary | ICD-10-CM

## 2011-09-29 DIAGNOSIS — K219 Gastro-esophageal reflux disease without esophagitis: Secondary | ICD-10-CM

## 2011-09-29 NOTE — Telephone Encounter (Signed)
Pt wanted to make sure her dates were correct for her esophageal manometry and the Upper Endoscopy. Went over instructions again over the phone and pt verbalized understanding.

## 2011-10-04 ENCOUNTER — Other Ambulatory Visit: Payer: Self-pay | Admitting: Gastroenterology

## 2011-10-05 ENCOUNTER — Ambulatory Visit (HOSPITAL_COMMUNITY)
Admission: RE | Admit: 2011-10-05 | Discharge: 2011-10-05 | Disposition: A | Discharge status: Home / Self Care | Payer: Medicare Other | Source: Ambulatory Visit | Attending: Gastroenterology | Admitting: Gastroenterology

## 2011-10-05 DIAGNOSIS — K219 Gastro-esophageal reflux disease without esophagitis: Secondary | ICD-10-CM | POA: Insufficient documentation

## 2011-10-05 DIAGNOSIS — K449 Diaphragmatic hernia without obstruction or gangrene: Secondary | ICD-10-CM | POA: Insufficient documentation

## 2011-10-05 DIAGNOSIS — R0789 Other chest pain: Secondary | ICD-10-CM | POA: Insufficient documentation

## 2011-10-06 ENCOUNTER — Ambulatory Visit (AMBULATORY_SURGERY_CENTER): Payer: Medicare Other | Admitting: Gastroenterology

## 2011-10-06 ENCOUNTER — Telehealth: Payer: Self-pay

## 2011-10-06 ENCOUNTER — Encounter: Payer: Self-pay | Admitting: Gastroenterology

## 2011-10-06 DIAGNOSIS — K219 Gastro-esophageal reflux disease without esophagitis: Secondary | ICD-10-CM

## 2011-10-06 DIAGNOSIS — R079 Chest pain, unspecified: Secondary | ICD-10-CM

## 2011-10-06 DIAGNOSIS — K449 Diaphragmatic hernia without obstruction or gangrene: Secondary | ICD-10-CM

## 2011-10-06 MED ORDER — SODIUM CHLORIDE 0.9 % IV SOLN: 500.0000 mL | INTRAVENOUS | Status: DC

## 2011-10-06 NOTE — Telephone Encounter (Signed)
Patient scheduled to see Dr. Michaell Cowing 10/20/11 at 10:15am, needs to arrive at 9:55am;  Patient advised of information about appointment.

## 2011-10-06 NOTE — Progress Notes (Signed)
Pt snoring loudly upon arrival to the recovery room.  Will not respond to stimuli; sternal rub done, cold clothes applied to forehead.  Ammonia used for pt to smell for pt to attempt to wake up.  After ammonia used, pt did open eyes momentarily but closed eyes right away.  Oxygen on at 6/Liters per nasal cannula upon arrival to RR. Unable to get an accurate oxygen saturation- 72-79%  Pt. Skin color pink, warm and dry.  Attempted to place probe onto ear, unable to still get an accurate reading.  Probe placed sideways on finger and sats 93%  1450- O2 at 4/Liters- sats 97%  1500- O2 at 2/Liters- sats 95%  1505- Pt on room air.  Sats 94-98%.  Pt awake at this time.

## 2011-10-06 NOTE — Patient Instructions (Signed)
Please refer to discharge instructions (blue and green sheets)  Resume your regular medications  Dr. Ardell Isaacs office will call you to set up an appointment with a surgeon to treat your hiatal hernia

## 2011-10-07 ENCOUNTER — Telehealth: Payer: Self-pay | Admitting: *Deleted

## 2011-10-07 NOTE — Telephone Encounter (Signed)

## 2011-10-15 NOTE — Consult Note (Signed)
[name], [name]             ACCOUNT NO.:  [id number]  MEDICAL RECORD NO.:  [id number]  LOCATION:  [facility]                         FACILITY:  [facility]  PHYSICIAN:  Jersey Espinoza T. Russella Dar, MD, FACGDATE OF BIRTH:  Mar 09, 1932  DATE OF CONSULTATION:  10/14/2011 DATE OF DISCHARGE:  10/05/2011                                CONSULTATION   Esophageal Manometry Report  RESULTS:  Upper esophageal sphincter revealed normal pressures and normal relaxation.  Esophageal body showed 82% of swallows with peristaltic contraction, 9% of swallow simultaneous, 9% non-transmitted.  The pressures were normal except for channel 3 which had a low-pressure at 20 mmHg.  Lower esophageal sphincter study revealed a resting pressure of 23.7 mmHg, which was normal, 63% relaxation, and a mildly elevated residual pressure at 10.2 mmHg with normal being less than 8 mmHg.  IMPRESSION:  Essentially normal manometry study with a minimally elevated lower esophageal sphincter residual pressure.  RECOMMENDATIONS:  I do not see a contraindication on this study to performing repair of her paraesophageal hernia.     Venita Lick. Russella Dar, MD, [facility]     MTS/MEDQ  D:  10/14/2011  T:  10/15/2011  Job:  [id number]

## 2011-10-20 ENCOUNTER — Encounter: Payer: Self-pay | Admitting: Gastroenterology

## 2011-10-20 ENCOUNTER — Encounter (INDEPENDENT_AMBULATORY_CARE_PROVIDER_SITE_OTHER): Payer: Self-pay | Admitting: Surgery

## 2011-10-20 ENCOUNTER — Ambulatory Visit (INDEPENDENT_AMBULATORY_CARE_PROVIDER_SITE_OTHER): Payer: Medicare Other | Admitting: Surgery

## 2011-10-20 VITALS — BP 126/74 | HR 64 | Temp 96.9°F | Resp 14 | Ht 60.5 in | Wt 154.6 lb

## 2011-10-20 DIAGNOSIS — K440 Diaphragmatic hernia with obstruction, without gangrene: Secondary | ICD-10-CM

## 2011-10-20 DIAGNOSIS — D509 Iron deficiency anemia, unspecified: Secondary | ICD-10-CM

## 2011-10-20 HISTORY — DX: Diaphragmatic hernia with obstruction, without gangrene: K44.0

## 2011-10-20 NOTE — Progress Notes (Signed)
Subjective:     Patient ID: Brooke [name], female   DOB: 16-May-1932, 77 y.o.   MRN: [id number]  HPI  Patient Care Team: Cam Hai, CNM as PCP - General (Obstetrics and Gynecology) Meryl Dare, MD,FACG as Consulting Physician (Gastroenterology)  This patient is a 77 y.o.female who presents today for surgical evaluation.   Reason for visit: Giant paraesophageal hiatal hernia with episode of severe noncardiac chest pain and Cameron ulcerations. Consider surgery.  Patient has a history of chronic reflux that is controlled with diet changes and omeprazole. She's had intermittent episodes of GI blood loss. There's been a question of arterial venous malformations in the colon versus colitis. This was in spring 2012. In the summer time, she had an episode of severe shortness of breath and pressure and pain in her chest. She was sent to the emergency room at [facility]. There was concern of a heart attack on arrival, but workup was negative for any cardiopulmonary etiology. Suspicion of the hiatal hernia causing the problem was raised.  She's been followed by [facility] gastroenterology and more specifically Dr. Claudette Head. He did evaluation including an upper GI and endoscopy that showed very large paraesophageal hiatal hernia with most of the stomach up in the chest. She's had manometry that shows no esophageal dysmotility.  Based on the diagnosis of Sheria Lang ulcers and chronic iron deficiency anemia as well as this episode of severe noncardiac chest pain, she was sent to me to consider her surgical repair of her paraesophageal hiatal hernia.  Past Medical History  Diagnosis Date  . Diverticulosis   . Osteoporosis   . Hypertension   . Iron deficiency anemia   . Cameron lesion, acute   . AVM (arteriovenous malformation)   . GERD (gastroesophageal reflux disease)   . Internal hemorrhoid   . Hiatal hernia   . Diverticulosis   . Cough   . Paraesophageal hernia      Past Surgical History  Procedure Date  . Colonscopy   . Polypectomy   . Cesarean section 01/1965    History   Social History  . Marital Status: Married    Spouse Name: N/A    Number of Children: 1  . Years of Education: N/A   Occupational History  . Business owner     [employer]   Social History Main Topics  . Smoking status: Never Smoker   . Smokeless tobacco: Never Used  . Alcohol Use: 7.2 oz/week    12 Glasses of wine per week     1 glasses daily  . Drug Use: No  . Sexually Active: Not on file   Other Topics Concern  . Not on file   Social History Narrative  . No narrative on file    Family History  Problem Relation Age of Onset  . Prostate cancer Brother   . Colon cancer Neg Hx     Current outpatient prescriptions:hydrochlorothiazide 25 MG tablet, Take 25 mg by mouth daily.  , Disp: , Rfl: ;  metoprolol (LOPRESSOR) 50 MG tablet, Take 50 mg by mouth daily.  , Disp: , Rfl: ;  omeprazole (PRILOSEC) 20 MG capsule, TAKE ONE CAPSULE BY MOUTH EVERY DAY, Disp: 30 capsule, Rfl: 10;  perphenazine-amitriptyline (ETRAFON/TRIAVIL) 2-10 MG TABS, Take 2 tablets by mouth daily. , Disp: , Rfl:  simvastatin (ZOCOR) 20 MG tablet, Take 20 mg by mouth at bedtime.  , Disp: , Rfl:   No Known Allergies     Review of  Systems  Constitutional: Negative for fever, chills, diaphoresis, appetite change and fatigue.  HENT: Negative for ear pain, sore throat, trouble swallowing, neck pain and ear discharge.   Eyes: Negative for photophobia, discharge and visual disturbance.  Respiratory: Positive for cough. Negative for apnea, choking, chest tightness, shortness of breath, wheezing and stridor.   Cardiovascular: Positive for chest pain. Negative for palpitations and leg swelling.       Walks 30 minutes w/o difficulty.  No history of cardiac disease.  Gastrointestinal: Positive for constipation. Negative for nausea, vomiting, abdominal pain, diarrhea, abdominal distention, anal  bleeding and rectal pain.       Mod/Severe reflux - PPIs help control.  Occasional dysphagia - mild.  BM q2-3 days  Genitourinary: Negative for dysuria, frequency and difficulty urinating.  Musculoskeletal: Negative for myalgias and gait problem.  Skin: Negative for color change, pallor, rash and wound.  Neurological: Negative for dizziness, speech difficulty, weakness and numbness.  Hematological: Negative for adenopathy.  Psychiatric/Behavioral: Negative for confusion and agitation. The patient is not nervous/anxious.        Objective:   Physical Exam  Constitutional: She is oriented to person, place, and time. She appears well-developed and well-nourished. No distress.  HENT:  Head: Normocephalic.  Mouth/Throat: Oropharynx is clear and moist. No oropharyngeal exudate.  Eyes: Conjunctivae and EOM are normal. Pupils are equal, round, and reactive to light. No scleral icterus.  Neck: Normal range of motion. Neck supple. No tracheal deviation present.  Cardiovascular: Normal rate, regular rhythm and intact distal pulses.   Pulmonary/Chest: Effort normal and breath sounds normal. No respiratory distress. She has no wheezes. She has no rales. She exhibits no tenderness.  Abdominal: Soft. She exhibits no distension and no mass. There is no tenderness. There is no rebound and no guarding. Hernia confirmed negative in the right inguinal area and confirmed negative in the left inguinal area.  Genitourinary: No vaginal discharge found.  Musculoskeletal: Normal range of motion. She exhibits no tenderness.  Lymphadenopathy:    She has no cervical adenopathy.       Right: No inguinal adenopathy present.       Left: No inguinal adenopathy present.  Neurological: She is alert and oriented to person, place, and time. No cranial nerve deficit. She exhibits normal muscle tone. Coordination normal.  Skin: Skin is warm and dry. No rash noted. She is not diaphoretic. No erythema.  Psychiatric: She has a  normal mood and affect. Her behavior is normal. Judgment and thought content normal.   Studies as noted above.  Upper GI shows a large paraesophageal hiatal hernia with most of the stomach appeared chest.  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 No obstructing mass. Large hiatal hernia.  Esophageal manometry shows no major dysmotility. One amplitude in the mid esophagus was low, but I agree with Dr. Russella Dar that motility is not abnormal. No nutcracker esophagus. No diffuse esophageal spasm. No achalasia.   Assessment:     PEH with episode of noncardiac chest pain and anemia with known Cameron ulcerations    Plan:     She does not look severely symptomatic at this time; however, I do not have any other good explanation for episode of severe pain. She never wants to have an episode like that again. I noted I think surgery will help prevent that. Cameron ulcerations and known anemia is a concern as well. Possibly colonic etiology for the anemia though.  Her husband seems to be leaning more towards surgical repair.  She is not certain but is leaning toward as well. They are thinking of doing this in January so she is time to recover to go back to work in March. I will call and discuss with Dr. Russella Dar to see his feelings as well. I did discuss the procedure et Karie Soda:  The anatomy & physiology of the foregut and anti-reflux mechanism was discussed.  The pathophysiology of hiatal herniation and GERD was discussed.  Natural history risks without surgery was discussed.   The patient's symptoms are not adequately controlled by medicines and other non-operative treatments.  I feel the risks of no intervention will lead to serious problems that outweigh the operative risks; therefore, I recommended surgery to reduce the hiatal hernia out of the chest and fundoplication to rebuild the anti-reflux valve and control reflux better.  Need for a thorough workup to rule out the differential diagnosis and  plan treatment was explained.  I explained laparoscopic techniques with possible need for an open approach.  Risks such as bleeding, infection, abscess, leak, need for further treatment, heart attack, death, and other risks were discussed.  Goals of post-operative recovery were discussed as well.  Possibility that this will not correct all symptoms was explained.  Post-operative dysphagia, need for short-term liquid & pureed diet, inability to vomit, possibility of reherniation, possible need for medicines to help control symptoms in addition to surgery were discussed.  We will work to minimize complications.   Educational handouts further explaining the pathology, treatment options, and dysphagia diet was given as well.  Questions were answered.  The patient expresses understanding & wishes to proceed with surgery.

## 2011-10-20 NOTE — Patient Instructions (Signed)
Hiatal Hernia A hiatal hernia occurs when a part of the stomach slides above the diaphragm. The diaphragm is the thin muscle separating the belly (abdomen) from the chest. A hiatal hernia can be something you are born with or develop over time. Hiatal hernias may allow stomach acid to flow back into your esophagus, the tube which carries food from your mouth to your stomach. If this acid causes problems it is called GERD (gastro-esophageal reflux disease).  SYMPTOMS  Common symptoms of GERD are heartburn (burning in your chest). This is worse when lying down or bending over. It may also cause belching and indigestion. Some of the things which make GERD worse are:  Increased weight pushes on stomach making acid rise more easily.   Smoking markedly increases acid production.   Alcohol decreases lower esophageal sphincter pressure (valve between stomach and esophagus), allowing acid from stomach into esophagus.   Late evening meals and going to bed with a full stomach increases pressure.   Anything that causes an increase in acid production.   Lower esophageal sphincter incompetence.  DIAGNOSIS  Hiatal hernia is often diagnosed with x-rays of your stomach and small bowel. This is called an UGI (upper gastrointestinal x-ray). Sometimes a gastroscopic procedure is done. This is a procedure where your caregiver uses a flexible instrument to look into the stomach and small bowel. HOME CARE INSTRUCTIONS   Try to achieve and maintain an ideal body weight.   Avoid drinking alcoholic beverages.   Stop smoking.   Put the head of your bed on 4 to 6 inch blocks. This will keep your head and esophagus higher than your stomach. If you cannot use blocks, sleep with several pillows under your head and shoulders.   Over-the-counter medications will decrease acid production. Your caregiver can also prescribe medications for this. Take as directed.   1/2 to 1 teaspoon of an antacid taken every hour while  awake, with meals and at bedtime, will neutralize acid.   Do not take aspirin, ibuprofen (Advil or Motrin), or other nonsteroidal anti-inflammatory drugs.   Do not wear tight clothing around your chest or stomach.   Eat smaller meals and eat more frequently. This keeps your stomach from getting too full. Eat slowly.   Do not lie down for 2 or 3 hours after eating. Do not eat or drink anything 1 to 2 hours before going to bed.   Avoid caffeine beverages (colas, coffee, cocoa, tea), fatty foods, citrus fruits and all other foods and drinks that contain acid and that seem to increase the problems.   Avoid bending over, especially after eating. Also avoid straining during bowel movements or when urinating or lifting things. Anything that increases the pressure in your belly increases the amount of acid that may be pushed up into your esophagus.  SEEK IMMEDIATE MEDICAL CARE IF:  There is change in location (pain in arms, neck, jaw, teeth or back) of your pain, or the pain is getting worse.   You also experience nausea, vomiting, sweating (diaphoresis), or shortness of breath.   You develop continual vomiting, vomit blood or coffee ground material, have bright red blood in your stools, or have black tarry stools.  Some of these symptoms could signal other problems such as heart disease. MAKE SURE YOU:   Understand these instructions.   Monitor your condition.   Contact your caregiver if you are not doing well or are getting worse.  Document Released: 03/04/2004 Document Revised: 08/25/2011 Document Reviewed: 12/13/2005 [employer] Patient   Information 2012 [employer]. 

## 2011-10-26 ENCOUNTER — Telehealth (INDEPENDENT_AMBULATORY_CARE_PROVIDER_SITE_OTHER): Payer: Self-pay

## 2011-10-26 NOTE — Telephone Encounter (Signed)
Pt notified that Dr Michaell Cowing did speak with DR Russella Dar this am about her having surgery to repair her paraesophageal hernia at the beginning of the year. Dr Russella Dar was in agreement with the procedure with Dr Michaell Cowing. Dr Russella Dar asked if we would notify him of the pt's surgery date. The pt is ok with orders going to surgery scheduling. I delivered the pt's info to scheduling.Hulda Humphrey

## 2011-10-27 ENCOUNTER — Telehealth: Payer: Self-pay

## 2011-10-27 NOTE — Telephone Encounter (Signed)
Patient is scheduled for colon 11/22/11 4:00, she will come for a pre-visit 11/11/11 10:00

## 2011-10-27 NOTE — Telephone Encounter (Signed)
Message copied by Annett Fabian on Wed Oct 27, 2011 11:23 AM ------      Message from: Claudette Head T      Created: Tue Oct 26, 2011 10:11 AM       Pt seeing Dr. Michaell Cowing for repair of a large paraesophageal hernia. She had a recent EGD. She has an anemia and it seems her last colon was 02/2006. If that colonoscopy date is correct she needs a direct colonoscopy prior to surgery to further work up her anemia. I have discussed with Dr. Michaell Cowing. MS

## 2011-11-02 ENCOUNTER — Other Ambulatory Visit (INDEPENDENT_AMBULATORY_CARE_PROVIDER_SITE_OTHER): Payer: Self-pay | Admitting: Surgery

## 2011-11-11 ENCOUNTER — Ambulatory Visit (AMBULATORY_SURGERY_CENTER): Payer: Medicare Other | Admitting: *Deleted

## 2011-11-11 VITALS — Ht 62.5 in | Wt 154.0 lb

## 2011-11-11 DIAGNOSIS — D509 Iron deficiency anemia, unspecified: Secondary | ICD-10-CM

## 2011-11-11 DIAGNOSIS — Z1211 Encounter for screening for malignant neoplasm of colon: Secondary | ICD-10-CM

## 2011-11-11 MED ORDER — PEG-KCL-NACL-NASULF-NA ASC-C 100 G PO SOLR: ORAL | Status: DC

## 2011-11-22 ENCOUNTER — Ambulatory Visit (AMBULATORY_SURGERY_CENTER): Payer: Medicare Other | Admitting: Gastroenterology

## 2011-11-22 ENCOUNTER — Encounter: Payer: Self-pay | Admitting: Gastroenterology

## 2011-11-22 DIAGNOSIS — D509 Iron deficiency anemia, unspecified: Secondary | ICD-10-CM

## 2011-11-22 DIAGNOSIS — K219 Gastro-esophageal reflux disease without esophagitis: Secondary | ICD-10-CM

## 2011-11-22 DIAGNOSIS — D126 Benign neoplasm of colon, unspecified: Secondary | ICD-10-CM

## 2011-11-22 DIAGNOSIS — Z1211 Encounter for screening for malignant neoplasm of colon: Secondary | ICD-10-CM

## 2011-11-22 MED ORDER — SODIUM CHLORIDE 0.9 % IV SOLN: 500.0000 mL | INTRAVENOUS | Status: DC

## 2011-11-22 NOTE — Patient Instructions (Signed)
Please read the handouts given to you by your recovery room nurse.  Resume your routine medications today.  Try to increase the fiber in your diet due to your diverticulosis.   It would also help your hemorrhoids.    IF you have any questions, please call us at [phone].  Thank-you.

## 2011-11-22 NOTE — Progress Notes (Signed)
Patient did not experience any of the following events: a burn prior to discharge; a fall within the facility; wrong site/side/patient/procedure/implant event; or a hospital transfer or hospital admission upon discharge from the facility. (G8907) Patient did not have preoperative order for IV antibiotic SSI prophylaxis. (G8918)  

## 2011-11-23 ENCOUNTER — Telehealth: Payer: Self-pay | Admitting: *Deleted

## 2011-11-23 NOTE — Telephone Encounter (Signed)
Follow up Call- Patient questions:  Do you have a fever, pain , or abdominal swelling? no Pain Score  0 *  Have you tolerated food without any problems? yes  Have you been able to return to your normal activities? yes  Do you have any questions about your discharge instructions: Diet   no Medications  no Follow up visit  no  Do you have questions or concerns about your Care? no  Actions: * If pain score is 4 or above: No action needed, pain <4. Pt states evrey thing was very nice. ewm

## 2011-11-29 ENCOUNTER — Encounter: Payer: Self-pay | Admitting: Gastroenterology

## 2011-12-16 ENCOUNTER — Encounter (INDEPENDENT_AMBULATORY_CARE_PROVIDER_SITE_OTHER): Payer: Self-pay

## 2011-12-24 ENCOUNTER — Encounter (INDEPENDENT_AMBULATORY_CARE_PROVIDER_SITE_OTHER): Payer: Self-pay

## 2011-12-28 HISTORY — PX: HERNIA REPAIR: SHX51

## 2011-12-29 ENCOUNTER — Encounter (HOSPITAL_COMMUNITY): Payer: Self-pay | Admitting: Pharmacy Technician

## 2011-12-31 ENCOUNTER — Telehealth (INDEPENDENT_AMBULATORY_CARE_PROVIDER_SITE_OTHER): Payer: Self-pay | Admitting: Surgery

## 2012-01-03 ENCOUNTER — Telehealth (INDEPENDENT_AMBULATORY_CARE_PROVIDER_SITE_OTHER): Payer: Self-pay

## 2012-01-03 NOTE — Telephone Encounter (Signed)
Returned pt's message to her daughter-n-law about the sx going to take place on 01-13-12. They just had questions about the diet after surgery which I told them the pt would get diet after lap nissen. They wanted to know what case in line she was the day of her surgery. [facility]

## 2012-01-06 ENCOUNTER — Encounter (HOSPITAL_COMMUNITY)
Admission: RE | Admit: 2012-01-06 | Discharge: 2012-01-06 | Disposition: A | Discharge status: Home / Self Care | Payer: Medicare Other | Source: Ambulatory Visit | Attending: Surgery | Admitting: Surgery

## 2012-01-06 ENCOUNTER — Encounter (HOSPITAL_COMMUNITY): Payer: Self-pay

## 2012-01-06 HISTORY — DX: Unspecified osteoarthritis, unspecified site: M19.90

## 2012-01-06 LAB — SURGICAL PCR SCREEN: Staphylococcus aureus: NEGATIVE

## 2012-01-06 LAB — BASIC METABOLIC PANEL
BUN: 16 mg/dL (ref 6–23)
CO2: 26 mEq/L (ref 19–32)
Chloride: 100 mEq/L (ref 96–112)
Glucose, Bld: 97 mg/dL (ref 70–99)
Potassium: 3.4 mEq/L — ABNORMAL LOW (ref 3.5–5.1)
Sodium: 137 mEq/L (ref 135–145)

## 2012-01-06 LAB — CBC
HCT: 36.2 % (ref 36.0–46.0)
Hemoglobin: 11.6 g/dL — ABNORMAL LOW (ref 12.0–15.0)
MCHC: 32 g/dL (ref 30.0–36.0)
RBC: 4.64 MIL/uL (ref 3.87–5.11)
WBC: 5.4 10*3/uL (ref 4.0–10.5)

## 2012-01-06 NOTE — Patient Instructions (Signed)
22 Brooke [name]  01/06/2012   Your procedure is scheduled on:  01/13/12 1100am-145pm  Report to [facility] at 0900 AM.  Call this number if you have problems the morning of surgery: [phone]   Remember:   Do not eat food:After Midnight.  May have clear liquids:until Midnight .  Clear liquids include soda, tea, black coffee, apple or grape juice, broth.  Take these medicines the morning of surgery with A SIP OF WATER:    Do not wear jewelry, make-up or nail polish.  Do not wear lotions, powders, or perfumes.   Do not shave 48 hours prior to surgery.  Do not bring valuables to the hospital.  Contacts, dentures or bridgework may not be worn into surgery.  Leave suitcase in the car. After surgery it may be brought to your room.  For patients admitted to the hospital, checkout time is 11:00 AM the day of discharge.     Special Instructions: CHG Shower Use Special Wash: 1/2 bottle night before surgery and 1/2 bottle morning of surgery. shower chin to toes with CHG.  Wash face and private parts with regular soap.     Please read over the following fact sheets that you were given: MRSA Information, coughing and deep breathing exercises, leg exercises

## 2012-01-12 ENCOUNTER — Telehealth (INDEPENDENT_AMBULATORY_CARE_PROVIDER_SITE_OTHER): Payer: Self-pay

## 2012-01-12 NOTE — Telephone Encounter (Signed)
Pt called stating she has a runny nose and wants to take something for this. She states she knows she cannot take some things due to her surgery coming up. Pt advised to take a tylenol based product, nothing with advil or aspirin due to concern for bleeding. Pt also advised she may want to contact her PCP to be sure any otc product can be taken with any current meds.

## 2012-01-13 ENCOUNTER — Inpatient Hospital Stay (HOSPITAL_COMMUNITY)
Admission: RE | Admit: 2012-01-13 | Discharge: 2012-01-15 | DRG: 326 | Disposition: A | Discharge status: Home / Self Care | Payer: Medicare Other | Source: Ambulatory Visit | Attending: Surgery | Admitting: Surgery

## 2012-01-13 ENCOUNTER — Other Ambulatory Visit: Payer: Self-pay

## 2012-01-13 ENCOUNTER — Encounter (HOSPITAL_COMMUNITY)
Admission: RE | Disposition: A | Discharge status: Home / Self Care | Payer: Self-pay | Source: Ambulatory Visit | Attending: Surgery

## 2012-01-13 ENCOUNTER — Encounter (HOSPITAL_COMMUNITY): Payer: Self-pay | Admitting: Anesthesiology

## 2012-01-13 ENCOUNTER — Inpatient Hospital Stay (HOSPITAL_COMMUNITY): Payer: Medicare Other | Admitting: Anesthesiology

## 2012-01-13 ENCOUNTER — Encounter (HOSPITAL_COMMUNITY): Payer: Self-pay | Admitting: *Deleted

## 2012-01-13 DIAGNOSIS — M810 Age-related osteoporosis without current pathological fracture: Secondary | ICD-10-CM | POA: Diagnosis present

## 2012-01-13 DIAGNOSIS — I10 Essential (primary) hypertension: Secondary | ICD-10-CM | POA: Diagnosis not present

## 2012-01-13 DIAGNOSIS — K5520 Angiodysplasia of colon without hemorrhage: Secondary | ICD-10-CM | POA: Diagnosis present

## 2012-01-13 DIAGNOSIS — J9819 Other pulmonary collapse: Secondary | ICD-10-CM | POA: Diagnosis not present

## 2012-01-13 DIAGNOSIS — K254 Chronic or unspecified gastric ulcer with hemorrhage: Secondary | ICD-10-CM | POA: Diagnosis not present

## 2012-01-13 DIAGNOSIS — Z860100 Personal history of colon polyps, unspecified: Secondary | ICD-10-CM

## 2012-01-13 DIAGNOSIS — K648 Other hemorrhoids: Secondary | ICD-10-CM | POA: Diagnosis present

## 2012-01-13 DIAGNOSIS — M19049 Primary osteoarthritis, unspecified hand: Secondary | ICD-10-CM | POA: Diagnosis present

## 2012-01-13 DIAGNOSIS — J90 Pleural effusion, not elsewhere classified: Secondary | ICD-10-CM | POA: Diagnosis not present

## 2012-01-13 DIAGNOSIS — Z79899 Other long term (current) drug therapy: Secondary | ICD-10-CM | POA: Diagnosis not present

## 2012-01-13 DIAGNOSIS — R1310 Dysphagia, unspecified: Secondary | ICD-10-CM | POA: Diagnosis present

## 2012-01-13 DIAGNOSIS — I509 Heart failure, unspecified: Secondary | ICD-10-CM | POA: Diagnosis not present

## 2012-01-13 DIAGNOSIS — K449 Diaphragmatic hernia without obstruction or gangrene: Secondary | ICD-10-CM

## 2012-01-13 DIAGNOSIS — R918 Other nonspecific abnormal finding of lung field: Secondary | ICD-10-CM | POA: Diagnosis not present

## 2012-01-13 DIAGNOSIS — D509 Iron deficiency anemia, unspecified: Secondary | ICD-10-CM | POA: Diagnosis present

## 2012-01-13 DIAGNOSIS — F29 Unspecified psychosis not due to a substance or known physiological condition: Secondary | ICD-10-CM | POA: Diagnosis not present

## 2012-01-13 DIAGNOSIS — J9600 Acute respiratory failure, unspecified whether with hypoxia or hypercapnia: Secondary | ICD-10-CM | POA: Diagnosis not present

## 2012-01-13 DIAGNOSIS — J811 Chronic pulmonary edema: Secondary | ICD-10-CM | POA: Diagnosis not present

## 2012-01-13 DIAGNOSIS — E871 Hypo-osmolality and hyponatremia: Secondary | ICD-10-CM | POA: Diagnosis not present

## 2012-01-13 DIAGNOSIS — Y999 Unspecified external cause status: Secondary | ICD-10-CM

## 2012-01-13 DIAGNOSIS — K219 Gastro-esophageal reflux disease without esophagitis: Secondary | ICD-10-CM | POA: Diagnosis present

## 2012-01-13 DIAGNOSIS — Y92009 Unspecified place in unspecified non-institutional (private) residence as the place of occurrence of the external cause: Secondary | ICD-10-CM

## 2012-01-13 DIAGNOSIS — K5730 Diverticulosis of large intestine without perforation or abscess without bleeding: Secondary | ICD-10-CM | POA: Diagnosis present

## 2012-01-13 DIAGNOSIS — K224 Dyskinesia of esophagus: Secondary | ICD-10-CM | POA: Diagnosis not present

## 2012-01-13 DIAGNOSIS — Z01812 Encounter for preprocedural laboratory examination: Secondary | ICD-10-CM | POA: Diagnosis not present

## 2012-01-13 DIAGNOSIS — I517 Cardiomegaly: Secondary | ICD-10-CM | POA: Diagnosis not present

## 2012-01-13 DIAGNOSIS — E876 Hypokalemia: Secondary | ICD-10-CM | POA: Diagnosis not present

## 2012-01-13 DIAGNOSIS — K440 Diaphragmatic hernia with obstruction, without gangrene: Principal | ICD-10-CM | POA: Diagnosis present

## 2012-01-13 DIAGNOSIS — J189 Pneumonia, unspecified organism: Secondary | ICD-10-CM | POA: Diagnosis not present

## 2012-01-13 DIAGNOSIS — W06XXXA Fall from bed, initial encounter: Secondary | ICD-10-CM | POA: Diagnosis not present

## 2012-01-13 DIAGNOSIS — D384 Neoplasm of uncertain behavior of thymus: Secondary | ICD-10-CM | POA: Diagnosis not present

## 2012-01-13 DIAGNOSIS — R05 Cough: Secondary | ICD-10-CM | POA: Diagnosis not present

## 2012-01-13 DIAGNOSIS — D175 Benign lipomatous neoplasm of intra-abdominal organs: Secondary | ICD-10-CM | POA: Diagnosis present

## 2012-01-13 DIAGNOSIS — IMO0002 Reserved for concepts with insufficient information to code with codable children: Secondary | ICD-10-CM | POA: Diagnosis not present

## 2012-01-13 DIAGNOSIS — G319 Degenerative disease of nervous system, unspecified: Secondary | ICD-10-CM | POA: Diagnosis not present

## 2012-01-13 DIAGNOSIS — R0602 Shortness of breath: Secondary | ICD-10-CM | POA: Diagnosis not present

## 2012-01-13 HISTORY — PX: LAPAROSCOPIC NISSEN FUNDOPLICATION: SHX1932

## 2012-01-13 LAB — CREATININE, SERUM: Creatinine, Ser: 0.6 mg/dL (ref 0.50–1.10)

## 2012-01-13 LAB — CBC
MCH: 25.8 pg — ABNORMAL LOW (ref 26.0–34.0)
Platelets: 233 10*3/uL (ref 150–400)
RBC: 3.92 MIL/uL (ref 3.87–5.11)
WBC: 12.2 10*3/uL — ABNORMAL HIGH (ref 4.0–10.5)

## 2012-01-13 SURGERY — FUNDOPLICATION, NISSEN, LAPAROSCOPIC
Anesthesia: General | Site: Abdomen | Wound class: Clean Contaminated

## 2012-01-13 MED ORDER — SUCCINYLCHOLINE CHLORIDE 20 MG/ML IJ SOLN
INTRAMUSCULAR | Status: DC | PRN
  Administered 2012-01-13: 100 mg via INTRAVENOUS

## 2012-01-13 MED ORDER — ONDANSETRON HCL 4 MG/2ML IJ SOLN
INTRAMUSCULAR | Status: DC | PRN
  Administered 2012-01-13: 4 mg via INTRAVENOUS

## 2012-01-13 MED ORDER — PSYLLIUM 95 % PO PACK
1.0000 | PACK | Freq: Two times a day (BID) | ORAL | Status: DC
  Administered 2012-01-13 – 2012-01-15 (×4): 1 via ORAL
  Filled 2012-01-13 (×5): qty 1

## 2012-01-13 MED ORDER — ONDANSETRON HCL 4 MG/2ML IJ SOLN: 4.0000 mg | Freq: Four times a day (QID) | INTRAMUSCULAR | Status: DC | PRN

## 2012-01-13 MED ORDER — OXYCODONE HCL 5 MG PO TABS: 5.0000 mg | ORAL_TABLET | ORAL | Status: DC | PRN

## 2012-01-13 MED ORDER — DEXAMETHASONE SODIUM PHOSPHATE 10 MG/ML IJ SOLN
INTRAMUSCULAR | Status: DC | PRN
  Administered 2012-01-13: 10 mg via INTRAVENOUS

## 2012-01-13 MED ORDER — FLORA-Q PO CAPS
1.0000 | ORAL_CAPSULE | Freq: Every day | ORAL | Status: DC
  Administered 2012-01-14 – 2012-01-15 (×2): 1 via ORAL
  Filled 2012-01-13 (×3): qty 1

## 2012-01-13 MED ORDER — CEFAZOLIN SODIUM-DEXTROSE 2-3 GM-% IV SOLR
2.0000 g | INTRAVENOUS | Status: AC
  Administered 2012-01-13: 2 g via INTRAVENOUS

## 2012-01-13 MED ORDER — ALUM & MAG HYDROXIDE-SIMETH 200-200-20 MG/5ML PO SUSP: 30.0000 mL | Freq: Four times a day (QID) | ORAL | Status: DC | PRN

## 2012-01-13 MED ORDER — LIP MEDEX EX OINT
1.0000 "application " | TOPICAL_OINTMENT | Freq: Two times a day (BID) | CUTANEOUS | Status: DC
  Administered 2012-01-13 – 2012-01-15 (×4): 1 via TOPICAL
  Filled 2012-01-13: qty 7

## 2012-01-13 MED ORDER — METOPROLOL TARTRATE 12.5 MG HALF TABLET
12.5000 mg | ORAL_TABLET | Freq: Two times a day (BID) | ORAL | Status: DC
  Administered 2012-01-13 – 2012-01-15 (×4): 12.5 mg via ORAL
  Filled 2012-01-13 (×5): qty 1

## 2012-01-13 MED ORDER — HYDROMORPHONE HCL PF 1 MG/ML IJ SOLN
0.5000 mg | INTRAMUSCULAR | Status: DC | PRN
  Administered 2012-01-13 – 2012-01-14 (×5): 1 mg via INTRAVENOUS
  Filled 2012-01-13 (×5): qty 1

## 2012-01-13 MED ORDER — LACTATED RINGERS IR SOLN
Status: DC | PRN
  Administered 2012-01-13: 3000 mL

## 2012-01-13 MED ORDER — MAGIC MOUTHWASH
15.0000 mL | Freq: Four times a day (QID) | ORAL | Status: DC | PRN
  Filled 2012-01-13: qty 15

## 2012-01-13 MED ORDER — HYDRALAZINE HCL 20 MG/ML IJ SOLN
INTRAMUSCULAR | Status: DC | PRN
  Administered 2012-01-13: 5 mg via INTRAVENOUS

## 2012-01-13 MED ORDER — HYDROMORPHONE HCL PF 1 MG/ML IJ SOLN
0.2500 mg | INTRAMUSCULAR | Status: DC | PRN
  Administered 2012-01-13 (×2): 0.25 mg via INTRAVENOUS
  Administered 2012-01-13: 0.5 mg via INTRAVENOUS

## 2012-01-13 MED ORDER — PROMETHAZINE HCL 25 MG/ML IJ SOLN: 12.5000 mg | Freq: Four times a day (QID) | INTRAMUSCULAR | Status: DC | PRN

## 2012-01-13 MED ORDER — HEPARIN SODIUM (PORCINE) 5000 UNIT/ML IJ SOLN
5000.0000 [IU] | Freq: Three times a day (TID) | INTRAMUSCULAR | Status: DC
  Administered 2012-01-14 – 2012-01-15 (×4): 5000 [IU] via SUBCUTANEOUS
  Filled 2012-01-13 (×7): qty 1

## 2012-01-13 MED ORDER — LACTATED RINGERS IV SOLN
INTRAVENOUS | Status: DC
  Administered 2012-01-13: 1000 mL via INTRAVENOUS
  Administered 2012-01-13 (×2): via INTRAVENOUS

## 2012-01-13 MED ORDER — LIDOCAINE HCL (CARDIAC) 20 MG/ML IV SOLN
INTRAVENOUS | Status: DC | PRN
  Administered 2012-01-13: 80 mg via INTRAVENOUS

## 2012-01-13 MED ORDER — PROPOFOL 10 MG/ML IV EMUL
INTRAVENOUS | Status: DC | PRN
  Administered 2012-01-13: 140 mg via INTRAVENOUS

## 2012-01-13 MED ORDER — DIPHENHYDRAMINE HCL 50 MG/ML IJ SOLN: 12.5000 mg | Freq: Four times a day (QID) | INTRAMUSCULAR | Status: DC | PRN

## 2012-01-13 MED ORDER — GLYCOPYRROLATE 0.2 MG/ML IJ SOLN
INTRAMUSCULAR | Status: DC | PRN
  Administered 2012-01-13: .6 mg via INTRAVENOUS

## 2012-01-13 MED ORDER — SODIUM CHLORIDE 0.9 % IJ SOLN
INTRAMUSCULAR | Status: DC | PRN
  Administered 2012-01-13: 20 mL

## 2012-01-13 MED ORDER — DEXAMETHASONE SODIUM PHOSPHATE 4 MG/ML IJ SOLN
4.0000 mg | Freq: Two times a day (BID) | INTRAMUSCULAR | Status: DC | PRN
  Filled 2012-01-13: qty 1

## 2012-01-13 MED ORDER — PERPHENAZINE 4 MG PO TABS
4.0000 mg | ORAL_TABLET | Freq: Every day | ORAL | Status: DC
  Administered 2012-01-13 – 2012-01-14 (×2): 4 mg via ORAL
  Filled 2012-01-13 (×3): qty 1

## 2012-01-13 MED ORDER — LACTATED RINGERS IV SOLN
INTRAVENOUS | Status: DC
Start: 2012-01-13 — End: 2012-01-15
  Administered 2012-01-13: 18:00:00 via INTRAVENOUS
  Administered 2012-01-14: 75 mL via INTRAVENOUS
  Administered 2012-01-14: 50 mL/h via INTRAVENOUS

## 2012-01-13 MED ORDER — METOPROLOL SUCCINATE ER 50 MG PO TB24
50.0000 mg | ORAL_TABLET | Freq: Every day | ORAL | Status: DC
  Administered 2012-01-13: 50 mg via ORAL
  Filled 2012-01-13: qty 1

## 2012-01-13 MED ORDER — TAB-A-VITE/IRON PO TABS
1.0000 | ORAL_TABLET | Freq: Every day | ORAL | Status: DC
  Administered 2012-01-14 – 2012-01-15 (×2): 1 via ORAL
  Filled 2012-01-13 (×2): qty 1

## 2012-01-13 MED ORDER — PERPHENAZINE-AMITRIPTYLINE 2-10 MG PO TABS: 2.0000 | ORAL_TABLET | Freq: Every day | ORAL | Status: DC

## 2012-01-13 MED ORDER — LACTATED RINGERS IV SOLN: INTRAVENOUS | Status: DC

## 2012-01-13 MED ORDER — SUFENTANIL CITRATE 50 MCG/ML IV SOLN
INTRAVENOUS | Status: DC | PRN
  Administered 2012-01-13 (×2): 10 ug via INTRAVENOUS
  Administered 2012-01-13 (×3): 5 ug via INTRAVENOUS
  Administered 2012-01-13: 15 ug via INTRAVENOUS
  Administered 2012-01-13: 10 ug via INTRAVENOUS

## 2012-01-13 MED ORDER — PANTOPRAZOLE SODIUM 40 MG PO TBEC
40.0000 mg | DELAYED_RELEASE_TABLET | Freq: Every day | ORAL | Status: DC
  Administered 2012-01-14: 40 mg via ORAL
  Filled 2012-01-13 (×3): qty 1

## 2012-01-13 MED ORDER — NEOSTIGMINE METHYLSULFATE 1 MG/ML IJ SOLN
INTRAMUSCULAR | Status: DC | PRN
  Administered 2012-01-13: 4 mg via INTRAVENOUS

## 2012-01-13 MED ORDER — 0.9 % SODIUM CHLORIDE (POUR BTL) OPTIME
TOPICAL | Status: DC | PRN
  Administered 2012-01-13: 1000 mL

## 2012-01-13 MED ORDER — MIDAZOLAM HCL 5 MG/5ML IJ SOLN
INTRAMUSCULAR | Status: DC | PRN
  Administered 2012-01-13: 1 mg via INTRAVENOUS

## 2012-01-13 MED ORDER — HETASTARCH-ELECTROLYTES 6 % IV SOLN
INTRAVENOUS | Status: DC | PRN
  Administered 2012-01-13: 12:00:00 via INTRAVENOUS

## 2012-01-13 MED ORDER — HYDROCHLOROTHIAZIDE 25 MG PO TABS
12.5000 mg | ORAL_TABLET | Freq: Every day | ORAL | Status: DC
  Administered 2012-01-14: 12.5 mg via ORAL
  Filled 2012-01-13 (×2): qty 0.5

## 2012-01-13 MED ORDER — HYDROMORPHONE HCL PF 1 MG/ML IJ SOLN
INTRAMUSCULAR | Status: AC
  Filled 2012-01-13: qty 1

## 2012-01-13 MED ORDER — GUAIFENESIN-DM 100-10 MG/5ML PO SYRP: 15.0000 mL | ORAL_SOLUTION | ORAL | Status: DC | PRN

## 2012-01-13 MED ORDER — METOPROLOL TARTRATE 1 MG/ML IV SOLN
5.0000 mg | Freq: Four times a day (QID) | INTRAVENOUS | Status: DC | PRN
  Filled 2012-01-13: qty 5

## 2012-01-13 MED ORDER — AMITRIPTYLINE HCL 10 MG PO TABS
20.0000 mg | ORAL_TABLET | Freq: Every day | ORAL | Status: DC
  Administered 2012-01-13 – 2012-01-14 (×2): 20 mg via ORAL
  Filled 2012-01-13 (×3): qty 2

## 2012-01-13 MED ORDER — HYDROMORPHONE BOLUS VIA INFUSION: 0.5000 mg | INTRAVENOUS | Status: DC | PRN

## 2012-01-13 MED ORDER — ACETAMINOPHEN 325 MG PO TABS
650.0000 mg | ORAL_TABLET | Freq: Four times a day (QID) | ORAL | Status: DC
  Administered 2012-01-13 – 2012-01-15 (×6): 650 mg via ORAL
  Filled 2012-01-13 (×9): qty 2

## 2012-01-13 MED ORDER — ONDANSETRON HCL 4 MG PO TABS: 4.0000 mg | ORAL_TABLET | Freq: Four times a day (QID) | ORAL | Status: DC | PRN

## 2012-01-13 MED ORDER — BUPIVACAINE LIPOSOME 1.3 % IJ SUSP
20.0000 mL | Freq: Once | INTRAMUSCULAR | Status: AC
  Administered 2012-01-13: 20 mL
  Filled 2012-01-13: qty 20

## 2012-01-13 MED ORDER — PROMETHAZINE HCL 25 MG/ML IJ SOLN: 6.2500 mg | INTRAMUSCULAR | Status: DC | PRN

## 2012-01-13 MED ORDER — ROCURONIUM BROMIDE 100 MG/10ML IV SOLN
INTRAVENOUS | Status: DC | PRN
  Administered 2012-01-13 (×4): 10 mg via INTRAVENOUS
  Administered 2012-01-13: 40 mg via INTRAVENOUS

## 2012-01-13 MED ORDER — EPHEDRINE SULFATE 50 MG/ML IJ SOLN
INTRAMUSCULAR | Status: DC | PRN
  Administered 2012-01-13: 5 mg via INTRAVENOUS

## 2012-01-13 SURGICAL SUPPLY — 66 items
APPLIER CLIP 5 13 M/L LIGAMAX5 (MISCELLANEOUS) ×2
APPLIER CLIP ROT 10 11.4 M/L (STAPLE)
CABLE HIGH FREQUENCY MONO STRZ (ELECTRODE) ×2 IMPLANT
CANISTER SUCTION 2500CC (MISCELLANEOUS) ×2 IMPLANT
CLIP APPLIE 5 13 M/L LIGAMAX5 (MISCELLANEOUS) ×1 IMPLANT
CLIP APPLIE ROT 10 11.4 M/L (STAPLE) IMPLANT
CLOTH BEACON ORANGE TIMEOUT ST (SAFETY) ×2 IMPLANT
COVER SURGICAL LIGHT HANDLE (MISCELLANEOUS) ×2 IMPLANT
DECANTER SPIKE VIAL GLASS SM (MISCELLANEOUS) ×2 IMPLANT
DRAIN CHANNEL RND F F (WOUND CARE) ×2 IMPLANT
DRAIN PENROSE 18X1/2 LTX STRL (DRAIN) IMPLANT
DRAPE LAPAROSCOPIC ABDOMINAL (DRAPES) ×2 IMPLANT
DRAPE WARM FLUID 44X44 (DRAPE) ×2 IMPLANT
DRSG TEGADERM 2-3/8X2-3/4 SM (GAUZE/BANDAGES/DRESSINGS) ×2 IMPLANT
DRSG TEGADERM 4X4.75 (GAUZE/BANDAGES/DRESSINGS) ×2 IMPLANT
ELECT REM PT RETURN 9FT ADLT (ELECTROSURGICAL) ×2
ELECTRODE REM PT RTRN 9FT ADLT (ELECTROSURGICAL) ×1 IMPLANT
EVACUATOR SILICONE 100CC (DRAIN) ×2 IMPLANT
FELT TEFLON 4 X1 (Mesh General) ×2 IMPLANT
FILTER SMOKE EVAC LAPAROSHD (FILTER) ×2 IMPLANT
GAUZE SPONGE 2X2 8PLY STRL LF (GAUZE/BANDAGES/DRESSINGS) ×1 IMPLANT
GLOVE BIOGEL PI IND STRL 7.0 (GLOVE) ×1 IMPLANT
GLOVE BIOGEL PI INDICATOR 7.0 (GLOVE) ×1
GLOVE ECLIPSE 8.0 STRL XLNG CF (GLOVE) ×2 IMPLANT
GLOVE INDICATOR 8.0 STRL GRN (GLOVE) ×2 IMPLANT
GOWN STRL NON-REIN LRG LVL3 (GOWN DISPOSABLE) ×2 IMPLANT
GOWN STRL REIN XL XLG (GOWN DISPOSABLE) ×4 IMPLANT
HAND ACTIVATED (MISCELLANEOUS) ×2 IMPLANT
KIT BASIN OR (CUSTOM PROCEDURE TRAY) ×2 IMPLANT
LEGGING LITHOTOMY PAIR STRL (DRAPES) ×2 IMPLANT
NEEDLE SPNL 22GX3.5 QUINCKE BK (NEEDLE) ×2 IMPLANT
NS IRRIG 1000ML POUR BTL (IV SOLUTION) ×2 IMPLANT
PEN SKIN MARKING BROAD (MISCELLANEOUS) ×2 IMPLANT
PENCIL BUTTON HOLSTER BLD 10FT (ELECTRODE) IMPLANT
PUNCH BIOPSY 3 (MISCELLANEOUS) IMPLANT
SCISSORS LAP 5X35 DISP (ENDOMECHANICALS) ×2 IMPLANT
SEALANT SURGICAL APPL DUAL CAN (MISCELLANEOUS) IMPLANT
SET IRRIG TUBING LAPAROSCOPIC (IRRIGATION / IRRIGATOR) ×2 IMPLANT
SLEEVE Z-THREAD 5X100MM (TROCAR) ×6 IMPLANT
SOLUTION ANTI FOG 6CC (MISCELLANEOUS) IMPLANT
SPONGE DRAIN TRACH 4X4 STRL 2S (GAUZE/BANDAGES/DRESSINGS) IMPLANT
SPONGE GAUZE 2X2 STER 10/PKG (GAUZE/BANDAGES/DRESSINGS) ×1
STAPLER VISISTAT 35W (STAPLE) ×2 IMPLANT
SUT 0 0 DBL MH NEEDLE ETHIBOND (SUTURE) ×6 IMPLANT
SUT ETHIBOND 0 (SUTURE) IMPLANT
SUT ETHIBOND 0 36 GRN (SUTURE) IMPLANT
SUT ETHIBOND 2 0 SH (SUTURE) ×1
SUT ETHIBOND 2 0 SH 36X2 (SUTURE) ×1 IMPLANT
SUT ETHIBOND CT1 BRD #0 30IN (SUTURE) IMPLANT
SUT ETHIBOND NAB CT1 #1 30IN (SUTURE) ×4 IMPLANT
SUT ETHILON 2 0 PS N (SUTURE) IMPLANT
SUT MNCRL AB 4-0 PS2 18 (SUTURE) ×2 IMPLANT
SUT PDS AB 3-0 SH 27 (SUTURE) IMPLANT
SUT PDS AB 4-0 SH 27 (SUTURE) IMPLANT
SUT VICRYL 0 TIES 12 18 (SUTURE) IMPLANT
TIP INNERVISION DETACH 40FR (MISCELLANEOUS) IMPLANT
TIP INNERVISION DETACH 50FR (MISCELLANEOUS) IMPLANT
TIP INNERVISION DETACH 56FR (MISCELLANEOUS) ×2 IMPLANT
TIPS INNERVISION DETACH 40FR (MISCELLANEOUS)
TOWEL OR 17X26 10 PK STRL BLUE (TOWEL DISPOSABLE) ×2 IMPLANT
TRAY FOLEY CATH 14FRSI W/METER (CATHETERS) ×2 IMPLANT
TRAY LAP CHOLE (CUSTOM PROCEDURE TRAY) ×2 IMPLANT
TROCAR Z-THREAD FIOS 11X100 BL (TROCAR) ×2 IMPLANT
TROCAR Z-THREAD FIOS 5X100MM (TROCAR) ×2 IMPLANT
TUBING FILTER THERMOFLATOR (ELECTROSURGICAL) ×2 IMPLANT
WATER STERILE IRR 1500ML POUR (IV SOLUTION) ×2 IMPLANT

## 2012-01-13 NOTE — Anesthesia Preprocedure Evaluation (Addendum)
Anesthesia Evaluation  Patient identified by MRN, date of birth, ID band Patient awake    Reviewed: Allergy & Precautions, H&P , NPO status , Patient's Chart, lab work & pertinent test results, reviewed documented beta blocker date and time   Airway Mallampati: II TM Distance: >3 FB Neck ROM: full    Dental  (+) Caps and Dental Advisory Given,    Pulmonary neg pulmonary ROS,  clear to auscultation  Pulmonary exam normal       Cardiovascular Exercise Tolerance: Good hypertension, On Home Beta Blockers regular Normal    Neuro/Psych AVM malformation.  Patient does not know anything about this.  Perhaps it is an error. Negative Psych ROS   GI/Hepatic Neg liver ROS, hiatal hernia, GERD-  Controlled and Medicated,  Endo/Other  Negative Endocrine ROSInsulin Dependent  Renal/GU negative Renal ROS  Genitourinary negative   Musculoskeletal   Abdominal   Peds  Hematology negative hematology ROS (+)   Anesthesia Other Findings   Reproductive/Obstetrics negative OB ROS                         Anesthesia Physical Anesthesia Plan  ASA: II  Anesthesia Plan: General   Post-op Pain Management:    Induction: Intravenous  Airway Management Planned: Oral ETT  Additional Equipment:   Intra-op Plan:   Post-operative Plan: Extubation in OR  Informed Consent: I have reviewed the patients History and Physical, chart, labs and discussed the procedure including the risks, benefits and alternatives for the proposed anesthesia with the patient or authorized representative who has indicated his/her understanding and acceptance.   Dental Advisory Given  Plan Discussed with: CRNA and Surgeon  Anesthesia Plan Comments:         Anesthesia Quick Evaluation

## 2012-01-13 NOTE — Transfer of Care (Signed)
Immediate Anesthesia Transfer of Care Note  Patient: Brooke [name]  Procedure(s) Performed:  LAPAROSCOPIC NISSEN FUNDOPLICATION - laparoscopic paraesphogeal hernia repair and fundoplication  Patient Location: PACU  Anesthesia Type: General  Level of Consciousness: awake, sedated and responds to stimulation  Airway & Oxygen Therapy: Patient Spontanous Breathing and Patient connected to face mask oxygen  Post-op Assessment: Report given to PACU RN and Post -op Vital signs reviewed and stable  Post vital signs: stable Filed Vitals:   01/13/12 1430  BP:   Pulse:   Temp: 37.3 C  Resp:     Complications: No apparent anesthesia complications

## 2012-01-13 NOTE — Anesthesia Postprocedure Evaluation (Signed)
  Anesthesia Post-op Note  Patient: Brooke [name]  Procedure(s) Performed:  LAPAROSCOPIC NISSEN FUNDOPLICATION - laparoscopic paraesphogeal hernia repair and fundoplication  Patient Location: PACU  Anesthesia Type: General  Level of Consciousness: awake and alert   Airway and Oxygen Therapy: Patient Spontanous Breathing  Post-op Pain: mild  Post-op Assessment: Post-op Vital signs reviewed, Patient's Cardiovascular Status Stable, Respiratory Function Stable, Patent Airway and No signs of Nausea or vomiting  Post-op Vital Signs: stable  Complications: No apparent anesthesia complications

## 2012-01-13 NOTE — Brief Op Note (Signed)
01/13/2012  2:34 PM  PATIENT:  Brooke [name]  78 y.o. female  Patient Care Team: Cam Hai, CNM as PCP - General (Obstetrics and Gynecology) Venita Lick Pleas Koch., MD,FACG as Consulting Physician (Gastroenterology)  PRE-OPERATIVE DIAGNOSIS:  paraesaphageal hiatal hernia   POST-OPERATIVE DIAGNOSIS:  paraesaphageal hiatal hernia (PEH)  PROCEDURE:  Procedure(s): Laparoscopic reduction & repair of PEH LAPAROSCOPIC NISSEN FUNDOPLICATION  SURGEON:    Ardeth Sportsman, MD  ASSISTANTS:   Clovis Pu. Cornett, MD   ANESTHESIA:   local and general  EBL:  Total I/O In: 1500 [I.V.:1000; IV Piggyback:500] Out: 350 [Urine:225; Blood:125]  BLOOD ADMINISTERED:none  DRAINS: (19FR) Blake drain(s) in the mediastinum out the RUQ   LOCAL MEDICATIONS USED:  Liposomal BUPIVICAINE 40CC  SPECIMEN:  No Specimen  DISPOSITION OF SPECIMEN:  N/A  COUNTS:  YES  TOURNIQUET:  * No tourniquets in log *  DICTATION: .Other Dictation: Dictation Number [id number]  PLAN OF CARE: Admit to inpatient   PATIENT DISPOSITION:  PACU - hemodynamically stable.   Delay start of Pharmacological VTE agent (>24hrs) due to surgical blood loss or risk of bleeding:  {YES/NO/NOT APPLICABLE:20182

## 2012-01-13 NOTE — OR Nursing (Signed)
Pt c/o epigastric/ sternal chest pain w/ radiation to neck, ST depression noted on EKG monitor, pt medicated for pain per MDA orders, oxygen via [place] at 4lpm implemented, 12 lead EKG done and results given to Surgeon and MDA for review. No additional orders rec'd at this time, will cont to monitor and observe

## 2012-01-13 NOTE — H&P (Signed)
HPI  Patient Care Team:  Cam Hai, CNM as PCP - General (Obstetrics and Gynecology)  Meryl Dare, MD,FACG as Consulting Physician (Gastroenterology)  This patient is a 78 y.o.female who presents today for surgical evaluation.   Reason for visit: Giant paraesophageal hiatal hernia.  Patient has a history of chronic reflux that is controlled with diet changes and omeprazole. She's had intermittent episodes of GI blood loss. There's been a question of arterial venous malformations in the colon versus colitis. This was in spring 2012. In the summer time, she had an episode of severe shortness of breath and pressure and pain in her chest. She was sent to the emergency room at [facility]. There was concern of a heart attack on arrival, but workup was negative for any cardiopulmonary etiology. Suspicion of the hiatal hernia causing the problem was raised.   She's been followed by [facility] gastroenterology, Dr. Claudette Head. He did evaluation including an upper GI and endoscopy that showed very large paraesophageal hiatal hernia with most of the stomach up in the chest. She's had manometry that shows no esophageal dysmotility.  Based on the diagnosis of Sheria Lang ulcers and chronic iron deficiency anemia as well as this episode of severe noncardiac chest pain, she was sent to me to consider her surgical repair of her paraesophageal hiatal hernia.   Pt was scared this AM when someone asked about her AVMs, thinking she had a brain problem.  No h/o strokes, trauma, CT scans.  The AVM is actually in the cecum, 3mm in size seen in 2007, but not in 2012 follow-up colonoscopy   Patient Active Problem List  Diagnoses  . ANEMIA, IRON DEFICIENCY  . HYPERTENSION  . HEMORRHOIDS, INTERNAL  . GERD  . DIVERTICULOSIS, COLON  . CONSTIPATION  . ANGIODYSPLASIA OF INTESTINE  . OSTEOPOROSIS  . ARTERIOVENOUS MALFORMATION  . COLONIC POLYPS, HYPERPLASTIC, HX OF  . Praesophageal hiatal hernia -  giant, irreducible    Past Medical History  Diagnosis Date  . Diverticulosis   . Osteoporosis   . Hypertension   . Iron deficiency anemia   . Cameron lesion, acute   . AVM (arteriovenous malformation)   . GERD (gastroesophageal reflux disease)   . Internal hemorrhoid   . Hiatal hernia   . Diverticulosis   . Cough   . Paraesophageal hernia   . Arthritis     hands     Past Surgical History  Procedure Date  . Colonscopy   . Polypectomy   . Cesarean section 01/1965  . Dilation and curettage of uterus     History   Social History  . Marital Status: Married    Spouse Name: N/A    Number of Children: 1  . Years of Education: N/A   Occupational History  . Business owner     [employer]   Social History Main Topics  . Smoking status: Never Smoker   . Smokeless tobacco: Never Used  . Alcohol Use: 10.8 oz/week    18 Glasses of wine per week     1 glasses daily  . Drug Use: No  . Sexually Active: Not on file   Other Topics Concern  . Not on file   Social History Narrative  . No narrative on file    Family History  Problem Relation Age of Onset  . Prostate cancer Brother   . Colon cancer Neg Hx     Current facility-administered medications:ceFAZolin (ANCEF) IVPB 2 g/50 mL premix, 2 g,  Intravenous, 60 min Pre-Op, Ardeth Sportsman, MD;  lactated ringers infusion, , Intravenous, Continuous, Gaetano Hawthorne, MD, Last Rate: 100 mL/hr at 01/13/12 0951, 1,000 mL at 01/13/12 0951;  metoprolol succinate (TOPROL-XL) 24 hr tablet 50 mg, 50 mg, Oral, Daily, Ardeth Sportsman, MD, 50 mg at 01/13/12 0924  No Known Allergies  BP 122/70  Pulse 70  Temp(Src) 97 F (36.1 C) (Oral)  Resp 18  SpO2 96%     Review of Systems  Constitutional: Negative for fever, chills, diaphoresis, appetite change and fatigue.  HENT: Negative for ear pain, sore throat, trouble swallowing, neck pain and ear discharge.  Eyes: Negative for photophobia, discharge and visual disturbance.    Respiratory: Positive for cough. Negative for apnea, choking, chest tightness, shortness of breath, wheezing and stridor.  Cardiovascular: Positive for chest pain. Negative for palpitations and leg swelling.  Walks 30 minutes w/o difficulty. No history of cardiac disease.  Gastrointestinal: Positive for constipation. Negative for nausea, vomiting, abdominal pain, diarrhea, abdominal distention, anal bleeding and rectal pain.  Mod/Severe reflux - PPIs help control. Occasional dysphagia - mild.  BM q2-3 days  Genitourinary: Negative for dysuria, frequency and difficulty urinating.  Musculoskeletal: Negative for myalgias and gait problem.  Skin: Negative for color change, pallor, rash and wound.  Neurological: Negative for dizziness, speech difficulty, weakness and numbness.  Hematological: Negative for adenopathy.  Psychiatric/Behavioral: Negative for confusion and agitation. The patient is anxious but consolable   Objective:   Physical Exam  Constitutional: She is oriented to person, place, and time. She appears well-developed and well-nourished. No distress.  HENT:  Head: Normocephalic.  Mouth/Throat: Oropharynx is clear and moist. No oropharyngeal exudate.  Eyes: Conjunctivae and EOM are normal. Pupils are equal, round, and reactive to light. No scleral icterus.  Neck: Normal range of motion. Neck supple. No tracheal deviation present.  Cardiovascular: Normal rate, regular rhythm and intact distal pulses.  Pulmonary/Chest: Effort normal and breath sounds normal. No respiratory distress. She has no wheezes. She has no rales. She exhibits no tenderness.  Abdominal: Soft. She exhibits no distension and no mass. There is no tenderness. There is no rebound and no guarding. Hernia confirmed negative in the right inguinal area and confirmed negative in the left inguinal area.  Genitourinary: No vaginal discharge found.  Musculoskeletal: Normal range of motion. She exhibits no tenderness.   Lymphadenopathy:  She has no cervical adenopathy.  Right: No inguinal adenopathy present.  Left: No inguinal adenopathy present.  Neurological: She is alert and oriented to person, place, and time. No cranial nerve deficit. She exhibits normal muscle tone. Coordination normal.  Skin: Skin is warm and dry. No rash noted. She is not diaphoretic. No erythema.  Psychiatric: She has a normal mood and affect. Her behavior is normal. Judgment and thought content normal.   Studies as noted above.  Upper GI shows a large paraesophageal hiatal hernia with most of the stomach appeared chest.  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 No obstructing mass. Large hiatal hernia.  Esophageal manometry shows no major dysmotility. One amplitude in the mid esophagus was low, but I agree with Dr. Russella Dar that motility is not abnormal. No nutcracker esophagus. No diffuse esophageal spasm. No achalasia.  Assessment:    PEH with episode of noncardiac chest pain and anemia with known Cameron ulcerations   Plan:    I think she would benefit from surgery:  The anatomy & physiology of the foregut and anti-reflux mechanism was discussed. The pathophysiology  of hiatal herniation and GERD was discussed. Natural history risks without surgery was discussed. The patient's symptoms are not adequately controlled by medicines and other non-operative treatments. I feel the risks of no intervention will lead to serious problems that outweigh the operative risks; therefore, I recommended surgery to reduce the hiatal hernia out of the chest and fundoplication to rebuild the anti-reflux valve and control reflux better. Need for a thorough workup to rule out the differential diagnosis and plan treatment was explained. I explained laparoscopic techniques with possible need for an open approach.   Risks such as bleeding, infection, abscess, leak, need for further treatment, heart attack, death, and other risks were discussed.  Goals of post-operative recovery were discussed as well. Possibility that this will not correct all symptoms was explained. Post-operative dysphagia, need for short-term liquid & pureed diet, inability to vomit, possibility of reherniation, possible need for medicines to help control symptoms in addition to surgery were discussed. We will work to minimize complications. Educational handouts further explaining the pathology, treatment options, and dysphagia diet was given as well. Questions were answered. The patient expresses understanding & wishes to proceed with surgery.   I have re-reviewed the the patient's records, history, medications, and allergies.  I have re-examined the patient.  I again discussed intraoperative plans and goals of post-operative recovery.  The patient agrees to proceed.

## 2012-01-13 NOTE — Op Note (Signed)
NAMENATHANIEL, YADEN NO.:  [id number]  MEDICAL RECORD NO.:  [id number]  LOCATION:  1536                         FACILITY:  [facility]  PHYSICIAN:  Ardeth Sportsman, MD     DATE OF BIRTH:  [birth year]  DATE OF PROCEDURE:  01/13/2012 DATE OF DISCHARGE:                              OPERATIVE REPORT   PRIMARY CARE PHYSICIAN:  Lupita Raider, M.D.  Gastroenterologist was not consulted.  SURGEON:  Ardeth Sportsman, MD.  ASSISTANT:  Clovis Pu. Cornett, M.D..  PREOPERATIVE DIAGNOSES:  Paraesophageal hiatal hernia with reflux, Cameron ulcers, and chest pain.  POSTOPERATIVE DIAGNOSIS:  Paraesophageal hiatal hernia with reflux, Cameron ulcers, and chest pain.  PROCEDURES PERFORMED: 1. Laparoscopic reduction and primary repair of paraesophageal hiatal     hernia. 2. Laparoscopic Nissen fundoplication. 3. Type 2 mediastinal dissection.  ANESTHESIA: 1. General anesthesia. 2. Local anesthetic and field block in all port sites, as well at     esophageal hiatal closure.  SPECIMENS:  Hernia sac (not sent).  DRAINS:  #19 [place] Blake drain run along the lesser curvature of the stomach into the mediastinum.  It exited out the right upper quadrant.  ESTIMATED BLOOD LOSS:  Less than 50 mL.  COMPLICATIONS:  None major.  INDICATIONS:  Ms. Rump is a 78 year old obese female who has struggled with reflux for quite some time.  She has had worsening symptoms despite maximal medical therapy.  She has had a couple of episodes of GI bleeding.  Workup noted some Cameron ulcers within a giant paraesophageal hiatal hernia.  She had a couple of episodes of severe chest pain, one that underwent emergent evaluation with a negative cardiopulmonary workup, so presumed secondary to hiatal hernia.  Anatomy and physiology of the foregut was discussed.  Pathophysiology of hiatal hernia was discussed.  Because she has had episodes of severe chest pain and dysphagia, heartburn, reflux, and  Cameron ulcerations with GI bleed from this she was sent to me for surgical evaluation.  I thought she would benefit from surgical reduction repair.  Manometry noted she could tolerate fundoplication.  Technique, risks, benefits, alternatives were discussed in detail.  Handouts were given as well. Questions were answered.  She and her husband agreed to proceed.  OPERATIVE FINDINGS:  She had a giant paraesophageal hiatal hernia with about 80% of her stomach up in her chest.  A part of retroperitoneum including the pancreas was on its way up to being in there but not fully up in it.  She has a primary closure over pledgets.  The closure was over a 54- French bougie.  She has a 1 cm Nissen fundoplication.  I did not do biologic mesh reinforcement given the good closure and advanced age.  I also got good intraabdominal esophageal length.  DESCRIPTION OF PROCEDURE:  Informed consent was confirmed.  The patient underwent general anesthesia without any difficulty.  She received IV antibiotics.  She had sequential pressure devices active during the entire case.  She had a Foley catheter sterilely placed.  She was positioned supine with a split leg table with arms tucked.  Her abdomen was prepped and draped in sterile fashion.  Surgical time-out confirmed our plan.  I placed a #5 mm port in the left upper quadrant paramedian region using optical entry technique with the patient in steep reverse Trendelenburg and left side up.  Entry was clean.  I induced carbon dioxide insufflation.  Camera inspection revealed no injury.  Under direct visualization, I placed a 10 mm port along the left subcostal ridge near the anterior axillary line.  I elevated the left lateral sector of the liver to confirm paraesophageal hiatal hernia.  I placed a 5 mm port in the left subxiphoid region, removed that and used an omega-shaped Nathanson liver retractor to help lift the left lateral sector of the liver  anteriorly to better expose the esophageal hiatus and diaphragm. I secured that to the bed using the Ironman system.  I placed another 5 mm port in the right supraumbilical region.  We placed a 5 mm grasper in the right upper quadrant.  We turned attention towards the hiatal hernia.  We were able to find the hiatal hernia sac.  I made a nick in the sac at the apex of the crura. We were able to reduce the anterior mediastinal hernia sac using primarily blunt dissection through the avascular plane as well as focused ultrasonic dissection, freeing the hernia sac off the right crus and then off the left crus.  As we were able to free the sac more proximally up into the anterior mediastinum and then free it off both pleura the suction cup effect of the sac was released and that allowed [place] to reduce the stomach and greater omentum contents.  We worked to free the hiatal hernia sac and phrenoesophageal attachments off the right crus down to its base.  We worked on the left side as well, although she had much more retroperitoneal fat in the posterior mediastinal hernia sac.  It took some time to free it off the left crus.  I decided to free the short gastric starting about a third of the way of the greater curvature and staying about 2 cm off the stomach to take that using ultrasonic dissection on a slow rate, slowly coming up and freeing off the last attachments to the spleen.  With that, I could better connect to the left crus and free the remaining phrenoesophageal attachments off the left crus as well to circumferentially release both crura.  Next, we placed the stomach and esophagus on axial tension and I did a type 2 mediastinal dissection freeing the distal half of the esophagus off its attachments to the mediastinum especially anteriorly.  The anterior and posterior vagus, they were a little more linguini size as opposed to the classic fettacine size.  We preserved them.  I did  primarily blunt dissection to free off mediastinal attachments to the esophagus, with some focused ultrasonic dissection as well.  With that, I could get about a 4 cm of intra-abdominal esophageal length off tension.  I freed the anterior mediastinal hernia sacs off the stomach.  I found that she had a moderate size lipoma on the medial aspect on the right side of the cardia, but eventually was able to enucleate it off the anterior and posterior vagi and ligate that and remove that out.  I also freed off the anterior hernia sac, left lateral of the vagus coming around towards the angle of His.  I was able to further skeletonize and peel back some of the hernia sac back off the cardia a little bit to better expose the esophagus.  We preserved the anterior  and posterior vagi.  I brought the fundus of the stomach behind the cardia to the right side to set up the right side of the wrap and did a classic shoeshine maneuver.  It came together well for a good Nissen.  We reflected the esophagus and stomach laterally towards the spleen to expose the esophageal hiatus.  I closed the esophagus using 0 Ethibond stitches over 1 x 2 cm pledgets on each side as horizontal mattress stitches.  I did this x4.  Actually, although the crura were somewhat thinned out, they were of reasonable caliber and actually came together well with minimal tension.  I did free off the right crura and a little bit of attachments to the left lateral sector of the liver a little bit to help release it and also freed it a little bit on the left side as well to help bring it together.  I did 4 interrupted horizontal mattress stitches to bring them together snugly.  Next, we reset up the wrap.  I did a posterior gastropexy using #1 Ethibond stitches to the right posterior side of the wrap and to the crura interrupted stitches x2 and tied that down.  I also did a left anterior gastropexy by taking a stitch on the left upper  aspect of the wrap to the apex of the left crus and tied that down.  Anesthesia passed a lit 54-French bougie transorally while we placed the esophagus and stomach on axial tension.  It passed smoothly.  I did a 2 stitch Nissen taking a bite of the left side of the wrap and a little anterior bite of the esophagus just right anterior of the anterior vagus nerve and then a bite of the right side of the wrap and tied them down. The wrap was about 12 mm in length.  I removed the bougie intact.  The wrap was floppy, allowing the grasper to go underneath the wrap.  The mediastinum closure was snug, but not overly tight.  I took a #19-French drain and placed it through the apex of the mediastinal closure to the esophagus and the apex of the crura, gently filling in probably about 20 cm up in there to make sure that the mediastinum was well drained.  I think I had a small nick in the right crura, but it was mild and hemostasis was excellent prior to closure.  There had been a little bit of oozing in the right retroperitoneum along the base of the right crus.  She had a replaced right hepatic artery that we preserved and a hypogastric ligament.  Her right gastric artery, we preserved as well.  She did have a bleeder branch off of the retroperitoneum that we saw, isolated and controlled with a few 5 mm clips.  Otherwise, hemostasis was excellent.  I removed the left lateral sector and I placed some bupivacaine and laid it on top of the closure and the diaphragm about 20 mL.  I removed the retractor under direct visualization.  I evacuated carbon dioxide and removed the ports.  The 10 mm port in the left subcostal ridge was slit up and I needed to close it primarily.  I closed the skin using 4-0 Monocryl stitch, the drain secured with 2-0 nylon.  The patient was extubated and out in the recovery room.  I discussed postoperative care with the patient and her family just prior to surgery and is  about to discuss it with her family again.  Ardeth Sportsman, MD     SCG/MEDQ  D:  01/13/2012  T:  01/13/2012  Job:  [id number]  cc:   Venita Lick. Russella Dar, MD, FACG [address] [place] [address]

## 2012-01-14 ENCOUNTER — Inpatient Hospital Stay (HOSPITAL_COMMUNITY): Payer: Medicare Other

## 2012-01-14 LAB — CBC
MCH: 25.2 pg — ABNORMAL LOW (ref 26.0–34.0)
MCHC: 31.9 g/dL (ref 30.0–36.0)
MCV: 79.2 fL (ref 78.0–100.0)
Platelets: 238 10*3/uL (ref 150–400)
RDW: 15.2 % (ref 11.5–15.5)
WBC: 10.4 10*3/uL (ref 4.0–10.5)

## 2012-01-14 LAB — CREATININE, SERUM: GFR calc Af Amer: >90 mL/min (ref >90)

## 2012-01-14 MED ORDER — HYDROCHLOROTHIAZIDE 12.5 MG PO CAPS
12.5000 mg | ORAL_CAPSULE | Freq: Every day | ORAL | Status: DC
  Administered 2012-01-15: 12.5 mg via ORAL
  Filled 2012-01-14: qty 1

## 2012-01-14 NOTE — Progress Notes (Signed)
Pt has been educated regarding correct use of Incentive Spirometer (IS). Pt verbalizes understanding of correct use. Pt states that there is no further questions regarding correct use of the IS. Laron Angelini Lorraine Cathlene Gardella, RN    

## 2012-01-14 NOTE — Progress Notes (Signed)
POET HINEMAN 03-30-1932 [id number]  PCP: Cam Hai, CNM, CNM Outpatient Care Team: Patient Care Team: Cam Hai, CNM as PCP - General (Obstetrics and Gynecology) Eliezer Bottom., MD,FACG as Consulting Physician (Gastroenterology)  Inpatient Treatment Team: Treatment Team: Attending Provider: Ardeth Sportsman, MD; Registered Nurse: Romeo Rabon, RN; Respiratory Therapist: Antoine Poche, RRT  Subjective:  Mild chest discomfort but much better No N/V Pain controlled  Objective:  Vital signs:  Temp:  [97 F (36.1 C)-99.1 F (37.3 C)] 98.2 F (36.8 C) (01/17 2219) Pulse Rate:  [70-93] 86  (01/17 2219) Resp:  [16-20] 20  (01/17 2219) BP: (112-135)/(55-79) 112/68 mmHg (01/17 2219) SpO2:  [89 %-96 %] 92 % (01/17 2219) Weight:  [154 lb (69.854 kg)] 154 lb (69.854 kg) (01/17 1750)    Intake/Output   Yesterday:  01/17 0701 - 01/18 0700 In: 3140 [P.O.:240; I.V.:2400; IV Piggyback:500] Out: 930 [Urine:775; Drains:5; Blood:150] This shift:  Total I/O In: 240 [P.O.:240] Out: 300 [Urine:300]  Bowel function:  Flatus: y  BM: n  Physical Exam:  General: Pt awake/alert/oriented x4 in no acute distress Eyes: PERRL, normal EOM.  Sclera clear.  No icterus Neuro: CN II-XII intact w/o focal sensory/motor deficits. Lymph: No head/neck/groin lymphadenopathy Psych:  No delerium/psychosis/paranoia HENT: Normocephalic, Mucus membranes moist.  No thrush Neck: Supple, No tracheal deviation Chest: Clear No chest wall pain w good excursion CV:  Pulses intact.  Regular rhythm Abdomen: Soft, Min tender at incisions/Nondistended.  No incarcerated hernias. Ext:  SCDs BLE.  No mjr edema.  No cyanosis Skin: No petechiae / purpurae  Results:   Labs: Results for orders placed during the hospital encounter of 01/13/12 (from the past 48 hour(s))  CBC     Status: Abnormal   Collection Time   01/13/12  5:15 PM      Component Value Range Comment   WBC 12.2 (*)  4.0 - 10.5 (K/uL)    RBC 3.92  3.87 - 5.11 (MIL/uL)    Hemoglobin 10.1 (*) 12.0 - 15.0 (g/dL)    HCT 40.9 (*) 81.1 - 46.0 (%)    MCV 78.8  78.0 - 100.0 (fL)    MCH 25.8 (*) 26.0 - 34.0 (pg)    MCHC 32.7  30.0 - 36.0 (g/dL)    RDW 91.4  78.2 - 95.6 (%)    Platelets 233  150 - 400 (K/uL)   CREATININE, SERUM     Status: Abnormal   Collection Time   01/13/12  5:15 PM      Component Value Range Comment   Creatinine, Ser 0.60  0.50 - 1.10 (mg/dL)    GFR calc non Af Amer 86 (*) >90 (mL/min)    GFR calc Af Amer >90  >90 (mL/min)   CBC     Status: Abnormal   Collection Time   01/14/12  3:44 AM      Component Value Range Comment   WBC 10.4  4.0 - 10.5 (K/uL)    RBC 4.08  3.87 - 5.11 (MIL/uL)    Hemoglobin 10.3 (*) 12.0 - 15.0 (g/dL)    HCT 21.3 (*) 08.6 - 46.0 (%)    MCV 79.2  78.0 - 100.0 (fL)    MCH 25.2 (*) 26.0 - 34.0 (pg)    MCHC 31.9  30.0 - 36.0 (g/dL)    RDW 57.8  46.9 - 62.9 (%)    Platelets 238  150 - 400 (K/uL)   POTASSIUM     Status: Normal  Collection Time   01/14/12  3:44 AM      Component Value Range Comment   Potassium 3.8  3.5 - 5.1 (mEq/L)   CREATININE, SERUM     Status: Abnormal   Collection Time   01/14/12  3:44 AM      Component Value Range Comment   Creatinine, Ser 0.70  0.50 - 1.10 (mg/dL)    GFR calc non Af Amer 81 (*) >90 (mL/min)    GFR calc Af Amer >90  >90 (mL/min)     Imaging / Studies: No results found.  Medications / Allergies: per chart  Antibiotics: Anti-infectives     Start     Dose/Rate Route Frequency Ordered Stop   01/13/12 1000   ceFAZolin (ANCEF) IVPB 2 g/50 mL premix        2 g 100 mL/hr over 30 Minutes Intravenous 60 min pre-op 01/13/12 1610 01/13/12 1105          Assessment  Arletta Bale  78 y.o. female  1 Day Post-Op  Procedure(s): LAPAROSCOPIC NISSEN FUNDOPLICATION  Problem List:  Principal Problem:  *Praesophageal hiatal hernia - giant, irreducible Active Problems:  HYPERTENSION  GERD   Stable s/p LAp  PEH/Nissen repair  Plan: -check wrap with UGI swallow today -liquids, then adv to pureed as tolerated -BP controlled, follow -VTE prophylaxis- SCDs, etc -mobilize as tolerated to help recovery  Ardeth Sportsman, M.D., F.A.C.S. Gastrointestinal and Minimally Invasive Surgery [facility] [address], [address] [place], [place] [address] [phone] Main / Paging [phone] Voice Mail   01/14/2012

## 2012-01-15 MED ORDER — OXYCODONE HCL 5 MG PO TABS: 5.0000 mg | ORAL_TABLET | ORAL | Status: AC | PRN

## 2012-01-15 NOTE — Progress Notes (Signed)
2 Days Post-Op  Subjective: Comfortable.  Voiding well. Tolerating diet.  No nausea.  Objective: Vital signs in last 24 hours: Temp:  [97.9 F (36.6 C)-98.6 F (37 C)] 98.6 F (37 C) (01/19 0450) Pulse Rate:  [71-90] 90  (01/19 0450) Resp:  [17-18] 17  (01/19 0450) BP: (98-113)/(65-74) 112/71 mmHg (01/19 0450) SpO2:  [76 %-96 %] 90 % (01/19 0450) Last BM Date: 01/12/12  Intake/Output from previous day: 01/18 0701 - 01/19 0700 In: 3210 [P.O.:1010; I.V.:2200] Out: 550 [Urine:450; Drains:100] Intake/Output this shift:    PE: Gen-NAD  Abd-soft, incisions c/d/i, serous drain output-drain removed.  Lab Results:   Basename 01/14/12 0344 01/13/12 1715  WBC 10.4 12.2*  HGB 10.3* 10.1*  HCT 32.3* 30.9*  PLT 238 233   BMET  Basename 01/14/12 0344 01/13/12 1715  NA -- --  K 3.8 --  CL -- --  CO2 -- --  GLUCOSE -- --  BUN -- --  CREATININE 0.70 0.60  CALCIUM -- --   PT/INR No results found for this basename: LABPROT:2,INR:2 in the last 72 hours Comprehensive Metabolic Panel:    Component Value Date/Time   NA 137 01/06/2012 0855   K 3.8 01/14/2012 0344   CL 100 01/06/2012 0855   CO2 26 01/06/2012 0855   BUN 16 01/06/2012 0855   CREATININE 0.70 01/14/2012 0344   GLUCOSE 97 01/06/2012 0855   CALCIUM 9.0 01/06/2012 0855   AST 42* 04/18/2011 2001   ALT 27 04/18/2011 2001   ALKPHOS 80 04/18/2011 2001   BILITOT 0.7 04/18/2011 2001   PROT 7.9 04/18/2011 2001   ALBUMIN 3.8 04/18/2011 2001     Studies/Results: Dg Ugi W/water Sol Cm  01/14/2012  *RADIOLOGY REPORT*  Clinical Data:  Para esophageal hernia status post Nissen fundoplication.  UPPER GI SERIES WITH KUB  Technique:  Routine upper GI series was performed with 50 ml water- soluble contrast (Omnipaque-300).  Fluoroscopy Time: 1.3 minutes  Comparison:  Upper GI series 09/17/2011 and abdominal CT 04/19/2011.  Findings: Scout abdominal radiograph demonstrates a surgical drain overlying the upper abdomen.  The bowel gas pattern  is normal. Diffuse lumbar spondylosis is noted.  On ingestion of water soluble contrast, mild esophageal spasm is noted.  The stomach is now repositioned beneath the diaphragm. There is expected extrinsic mass effect on the gastroesophageal junction consistent with Nissen fundoplication.  There is no esophageal obstruction or extravasation.  The proximal stomach is incompletely distended.  Allowing for this, there is no definite mucosal abnormality.  There is no evidence of gastric outlet obstruction.  The proximal small bowel appears normal.  IMPRESSION:  1.  No evidence of leak or obstruction status post repair of para esophageal hernia and Nissen fundoplication. 2.  Mild esophageal spasm.  Original Report Authenticated By: Gerrianne Scale, M.D.    Anti-infectives: Anti-infectives     Start     Dose/Rate Route Frequency Ordered Stop   01/13/12 1000   ceFAZolin (ANCEF) IVPB 2 g/50 mL premix        2 g 100 mL/hr over 30 Minutes Intravenous 60 min pre-op 01/13/12 1610 01/13/12 1105          Assessment Principal Problem:  *Praesophageal hiatal hernia - giant, irreducible:  S/p repair and progressing well Active Problems:  HYPERTENSION  GERD    LOS: 2 days   Plan: Discharge.  Instructions given.   Syd Manges J 01/15/2012

## 2012-01-15 NOTE — Plan of Care (Signed)
Problem: Phase I Progression Outcomes Goal: Pain controlled with appropriate interventions Outcome: Progressing Reluctant to take pain med, instructed in other techniques and encouraged to take pain med so can move around more.

## 2012-01-17 ENCOUNTER — Other Ambulatory Visit: Payer: Self-pay

## 2012-01-17 ENCOUNTER — Emergency Department (HOSPITAL_COMMUNITY): Payer: Medicare Other

## 2012-01-17 ENCOUNTER — Encounter (HOSPITAL_COMMUNITY): Payer: Self-pay | Admitting: Emergency Medicine

## 2012-01-17 ENCOUNTER — Inpatient Hospital Stay (HOSPITAL_COMMUNITY): Payer: Medicare Other

## 2012-01-17 ENCOUNTER — Inpatient Hospital Stay (HOSPITAL_COMMUNITY)
Admission: EM | Admit: 2012-01-17 | Discharge: 2012-01-21 | Disposition: A | Discharge status: Home / Self Care | Payer: Medicare Other | Source: Home / Self Care | Attending: Internal Medicine | Admitting: Internal Medicine

## 2012-01-17 DIAGNOSIS — Z9889 Other specified postprocedural states: Secondary | ICD-10-CM

## 2012-01-17 DIAGNOSIS — I517 Cardiomegaly: Secondary | ICD-10-CM

## 2012-01-17 DIAGNOSIS — Y999 Unspecified external cause status: Secondary | ICD-10-CM

## 2012-01-17 DIAGNOSIS — R059 Cough, unspecified: Secondary | ICD-10-CM | POA: Diagnosis not present

## 2012-01-17 DIAGNOSIS — I509 Heart failure, unspecified: Secondary | ICD-10-CM | POA: Diagnosis not present

## 2012-01-17 DIAGNOSIS — J9600 Acute respiratory failure, unspecified whether with hypoxia or hypercapnia: Secondary | ICD-10-CM

## 2012-01-17 DIAGNOSIS — K5900 Constipation, unspecified: Secondary | ICD-10-CM

## 2012-01-17 DIAGNOSIS — E871 Hypo-osmolality and hyponatremia: Secondary | ICD-10-CM | POA: Diagnosis present

## 2012-01-17 DIAGNOSIS — J90 Pleural effusion, not elsewhere classified: Secondary | ICD-10-CM | POA: Diagnosis not present

## 2012-01-17 DIAGNOSIS — K219 Gastro-esophageal reflux disease without esophagitis: Secondary | ICD-10-CM

## 2012-01-17 DIAGNOSIS — E876 Hypokalemia: Secondary | ICD-10-CM

## 2012-01-17 DIAGNOSIS — M19049 Primary osteoarthritis, unspecified hand: Secondary | ICD-10-CM | POA: Diagnosis present

## 2012-01-17 DIAGNOSIS — K440 Diaphragmatic hernia with obstruction, without gangrene: Secondary | ICD-10-CM

## 2012-01-17 DIAGNOSIS — I10 Essential (primary) hypertension: Secondary | ICD-10-CM

## 2012-01-17 DIAGNOSIS — M810 Age-related osteoporosis without current pathological fracture: Secondary | ICD-10-CM

## 2012-01-17 DIAGNOSIS — R0603 Acute respiratory distress: Secondary | ICD-10-CM

## 2012-01-17 DIAGNOSIS — R0602 Shortness of breath: Secondary | ICD-10-CM | POA: Diagnosis not present

## 2012-01-17 DIAGNOSIS — K648 Other hemorrhoids: Secondary | ICD-10-CM

## 2012-01-17 DIAGNOSIS — D509 Iron deficiency anemia, unspecified: Secondary | ICD-10-CM

## 2012-01-17 DIAGNOSIS — J189 Pneumonia, unspecified organism: Secondary | ICD-10-CM

## 2012-01-17 DIAGNOSIS — F29 Unspecified psychosis not due to a substance or known physiological condition: Secondary | ICD-10-CM | POA: Diagnosis present

## 2012-01-17 DIAGNOSIS — Z860100 Personal history of colon polyps, unspecified: Secondary | ICD-10-CM

## 2012-01-17 DIAGNOSIS — Z79899 Other long term (current) drug therapy: Secondary | ICD-10-CM

## 2012-01-17 DIAGNOSIS — Q279 Congenital malformation of peripheral vascular system, unspecified: Secondary | ICD-10-CM

## 2012-01-17 DIAGNOSIS — K5730 Diverticulosis of large intestine without perforation or abscess without bleeding: Secondary | ICD-10-CM | POA: Diagnosis present

## 2012-01-17 DIAGNOSIS — W06XXXA Fall from bed, initial encounter: Secondary | ICD-10-CM | POA: Diagnosis present

## 2012-01-17 DIAGNOSIS — J811 Chronic pulmonary edema: Secondary | ICD-10-CM | POA: Diagnosis not present

## 2012-01-17 DIAGNOSIS — R918 Other nonspecific abnormal finding of lung field: Secondary | ICD-10-CM | POA: Diagnosis not present

## 2012-01-17 DIAGNOSIS — R410 Disorientation, unspecified: Secondary | ICD-10-CM | POA: Diagnosis present

## 2012-01-17 DIAGNOSIS — Y92009 Unspecified place in unspecified non-institutional (private) residence as the place of occurrence of the external cause: Secondary | ICD-10-CM

## 2012-01-17 HISTORY — DX: Personal history of colon polyps, unspecified: Z86.0100

## 2012-01-17 HISTORY — DX: Personal history of other diseases of urinary system: Z87.448

## 2012-01-17 HISTORY — DX: Personal history of other diseases of the digestive system: Z87.19

## 2012-01-17 HISTORY — DX: Personal history of colonic polyps: Z86.010

## 2012-01-17 HISTORY — DX: Personal history of other endocrine, nutritional and metabolic disease: Z86.39

## 2012-01-17 HISTORY — DX: Hyperlipidemia, unspecified: E78.5

## 2012-01-17 LAB — SODIUM, URINE, RANDOM: Sodium, Ur: 99 mEq/L

## 2012-01-17 LAB — PROTEIN, BODY FLUID

## 2012-01-17 LAB — BASIC METABOLIC PANEL
BUN: 6 mg/dL (ref 6–23)
CO2: 27 mEq/L (ref 19–32)
CO2: 31 mEq/L (ref 19–32)
Chloride: 90 mEq/L — ABNORMAL LOW (ref 96–112)
GFR calc non Af Amer: 80 mL/min — ABNORMAL LOW (ref >90)
Glucose, Bld: 139 mg/dL — ABNORMAL HIGH (ref 70–99)
Glucose, Bld: 130 mg/dL — ABNORMAL HIGH (ref 70–99)
Potassium: 2.9 mEq/L — ABNORMAL LOW (ref 3.5–5.1)
Potassium: 3.2 mEq/L — ABNORMAL LOW (ref 3.5–5.1)
Sodium: 128 mEq/L — ABNORMAL LOW (ref 135–145)
Sodium: 133 mEq/L — ABNORMAL LOW (ref 135–145)

## 2012-01-17 LAB — BLOOD GAS, ARTERIAL
Acid-Base Excess: 4 mmol/L — ABNORMAL HIGH (ref 0.0–2.0)
Drawn by: 232811
Inspiratory PAP: 10
TCO2: 25.7 mmol/L (ref 0–100)
pCO2 arterial: 39.9 mmHg (ref 35.0–45.0)
pH, Arterial: 7.457 — ABNORMAL HIGH (ref 7.350–7.400)
pO2, Arterial: 71.8 mmHg — ABNORMAL LOW (ref 80.0–100.0)

## 2012-01-17 LAB — CBC
HCT: 27.9 % — ABNORMAL LOW (ref 36.0–46.0)
HCT: 27.9 % — ABNORMAL LOW (ref 36.0–46.0)
Hemoglobin: 9.2 g/dL — ABNORMAL LOW (ref 12.0–15.0)
Hemoglobin: 9.1 g/dL — ABNORMAL LOW (ref 12.0–15.0)
RBC: 3.57 MIL/uL — ABNORMAL LOW (ref 3.87–5.11)
WBC: 17.7 10*3/uL — ABNORMAL HIGH (ref 4.0–10.5)
WBC: 17.3 10*3/uL — ABNORMAL HIGH (ref 4.0–10.5)

## 2012-01-17 LAB — GLUCOSE, SEROUS FLUID: Glucose, Fluid: 136 mg/dL

## 2012-01-17 LAB — DIFFERENTIAL
Basophils Relative: 0 % (ref 0–1)
Eosinophils Relative: 0 % (ref 0–5)
Lymphocytes Relative: 5 % — ABNORMAL LOW (ref 12–46)
Monocytes Relative: 8 % (ref 3–12)
Neutro Abs: 15.4 10*3/uL — ABNORMAL HIGH (ref 1.7–7.7)

## 2012-01-17 LAB — MAGNESIUM: Magnesium: 1.9 mg/dL (ref 1.5–2.5)

## 2012-01-17 LAB — CARDIAC PANEL(CRET KIN+CKTOT+MB+TROPI)
CK, MB: 4.9 ng/mL — ABNORMAL HIGH (ref 0.3–4.0)
Relative Index: 0.4 (ref 0.0–2.5)
Total CK: 880 U/L — ABNORMAL HIGH (ref 7–177)
Total CK: 783 U/L — ABNORMAL HIGH (ref 7–177)
Troponin I: <0.3 ng/mL (ref <0.30)

## 2012-01-17 LAB — CREATININE, SERUM
Creatinine, Ser: 0.69 mg/dL (ref 0.50–1.10)
GFR calc Af Amer: >90 mL/min (ref >90)
GFR calc non Af Amer: 82 mL/min — ABNORMAL LOW (ref >90)

## 2012-01-17 LAB — OSMOLALITY: Osmolality: 272 mOsm/kg — ABNORMAL LOW (ref 275–300)

## 2012-01-17 LAB — PROTEIN, TOTAL: Total Protein: 6.4 g/dL (ref 6.0–8.3)

## 2012-01-17 LAB — BODY FLUID CELL COUNT WITH DIFFERENTIAL: Neutrophil Count, Fluid: 81 % — ABNORMAL HIGH (ref 0–25)

## 2012-01-17 LAB — PROCALCITONIN: Procalcitonin: 0.84 ng/mL

## 2012-01-17 LAB — PRO B NATRIURETIC PEPTIDE: Pro B Natriuretic peptide (BNP): 2552 pg/mL — ABNORMAL HIGH (ref 0–450)

## 2012-01-17 LAB — LACTATE DEHYDROGENASE, PLEURAL OR PERITONEAL FLUID: LD, Fluid: 1619 U/L — ABNORMAL HIGH (ref 3–23)

## 2012-01-17 LAB — LACTIC ACID, PLASMA: Lactic Acid, Venous: 2.6 mmol/L — ABNORMAL HIGH (ref 0.5–2.2)

## 2012-01-17 LAB — LIPID PANEL
LDL Cholesterol: 44 mg/dL (ref 0–99)
Total CHOL/HDL Ratio: 2.8 RATIO

## 2012-01-17 LAB — LEGIONELLA ANTIGEN, URINE: Legionella Antigen, Urine: NEGATIVE

## 2012-01-17 MED ORDER — PERPHENAZINE 4 MG PO TABS
4.0000 mg | ORAL_TABLET | Freq: Every day | ORAL | Status: DC
  Administered 2012-01-17 – 2012-01-20 (×4): 4 mg via ORAL
  Filled 2012-01-17 (×5): qty 1

## 2012-01-17 MED ORDER — IPRATROPIUM BROMIDE 0.02 % IN SOLN
0.5000 mg | Freq: Once | RESPIRATORY_TRACT | Status: AC
  Administered 2012-01-17: 0.5 mg via RESPIRATORY_TRACT
  Filled 2012-01-17: qty 2.5

## 2012-01-17 MED ORDER — PIPERACILLIN-TAZOBACTAM 3.375 G IVPB
3.3750 g | Freq: Three times a day (TID) | INTRAVENOUS | Status: DC
  Administered 2012-01-17 – 2012-01-20 (×9): 3.375 g via INTRAVENOUS
  Filled 2012-01-17 (×15): qty 50

## 2012-01-17 MED ORDER — IOHEXOL 300 MG/ML  SOLN
100.0000 mL | Freq: Once | INTRAMUSCULAR | Status: AC | PRN
  Administered 2012-01-17: 100 mL via INTRAVENOUS

## 2012-01-17 MED ORDER — FUROSEMIDE 10 MG/ML IJ SOLN
40.0000 mg | Freq: Once | INTRAMUSCULAR | Status: AC
  Administered 2012-01-17: 40 mg via INTRAVENOUS
  Filled 2012-01-17 (×2): qty 4

## 2012-01-17 MED ORDER — DEXTROSE 5 % IV SOLN
1.0000 g | Freq: Once | INTRAVENOUS | Status: AC
  Administered 2012-01-17: 1 g via INTRAVENOUS
  Filled 2012-01-17: qty 10

## 2012-01-17 MED ORDER — VANCOMYCIN HCL IN DEXTROSE 1-5 GM/200ML-% IV SOLN
1000.0000 mg | INTRAVENOUS | Status: AC
  Administered 2012-01-17: 1000 mg via INTRAVENOUS
  Filled 2012-01-17: qty 200

## 2012-01-17 MED ORDER — POTASSIUM CHLORIDE CRYS ER 20 MEQ PO TBCR
40.0000 meq | EXTENDED_RELEASE_TABLET | Freq: Once | ORAL | Status: AC
  Administered 2012-01-17: 40 meq via ORAL
  Filled 2012-01-17: qty 2

## 2012-01-17 MED ORDER — IPRATROPIUM BROMIDE 0.02 % IN SOLN: 0.5000 mg | RESPIRATORY_TRACT | Status: DC | PRN

## 2012-01-17 MED ORDER — METOPROLOL SUCCINATE ER 50 MG PO TB24
50.0000 mg | ORAL_TABLET | Freq: Every day | ORAL | Status: DC
  Administered 2012-01-17 – 2012-01-18 (×2): 50 mg via ORAL
  Filled 2012-01-17 (×3): qty 1

## 2012-01-17 MED ORDER — ALUM & MAG HYDROXIDE-SIMETH 200-200-20 MG/5ML PO SUSP: 30.0000 mL | Freq: Four times a day (QID) | ORAL | Status: DC | PRN

## 2012-01-17 MED ORDER — AMITRIPTYLINE HCL 10 MG PO TABS
20.0000 mg | ORAL_TABLET | Freq: Every day | ORAL | Status: DC
  Administered 2012-01-17 – 2012-01-20 (×4): 20 mg via ORAL
  Filled 2012-01-17 (×5): qty 2

## 2012-01-17 MED ORDER — FUROSEMIDE 10 MG/ML IJ SOLN
40.0000 mg | Freq: Two times a day (BID) | INTRAMUSCULAR | Status: DC
  Administered 2012-01-17 – 2012-01-20 (×6): 40 mg via INTRAVENOUS
  Filled 2012-01-17 (×11): qty 4

## 2012-01-17 MED ORDER — ONDANSETRON HCL 4 MG/2ML IJ SOLN: 4.0000 mg | Freq: Four times a day (QID) | INTRAMUSCULAR | Status: DC | PRN

## 2012-01-17 MED ORDER — ONDANSETRON HCL 4 MG PO TABS: 4.0000 mg | ORAL_TABLET | Freq: Four times a day (QID) | ORAL | Status: DC | PRN

## 2012-01-17 MED ORDER — POTASSIUM CHLORIDE 10 MEQ/100ML IV SOLN
10.0000 meq | Freq: Once | INTRAVENOUS | Status: AC
  Administered 2012-01-17: 10 meq via INTRAVENOUS
  Filled 2012-01-17: qty 100

## 2012-01-17 MED ORDER — BIOTENE DRY MOUTH MT LIQD
15.0000 mL | Freq: Two times a day (BID) | OROMUCOSAL | Status: DC
  Administered 2012-01-17 – 2012-01-18 (×4): 15 mL via OROMUCOSAL

## 2012-01-17 MED ORDER — PERPHENAZINE-AMITRIPTYLINE 2-10 MG PO TABS
2.0000 | ORAL_TABLET | Freq: Every day | ORAL | Status: DC
  Filled 2012-01-17: qty 2

## 2012-01-17 MED ORDER — VANCOMYCIN HCL 500 MG IV SOLR
500.0000 mg | Freq: Two times a day (BID) | INTRAVENOUS | Status: DC
  Administered 2012-01-17 – 2012-01-19 (×5): 500 mg via INTRAVENOUS
  Filled 2012-01-17 (×6): qty 500

## 2012-01-17 MED ORDER — SODIUM CHLORIDE 0.9 % IJ SOLN: 3.0000 mL | INTRAMUSCULAR | Status: DC | PRN

## 2012-01-17 MED ORDER — VANCOMYCIN HCL 10 G IV SOLR
1.0000 g | Freq: Once | INTRAVENOUS | Status: DC
  Filled 2012-01-17: qty 1000

## 2012-01-17 MED ORDER — DEXTROSE 5 % IV SOLN
500.0000 mg | Freq: Once | INTRAVENOUS | Status: AC
  Administered 2012-01-17: 500 mg via INTRAVENOUS
  Filled 2012-01-17: qty 500

## 2012-01-17 MED ORDER — ALBUTEROL (5 MG/ML) CONTINUOUS INHALATION SOLN
10.0000 mg/h | INHALATION_SOLUTION | Freq: Once | RESPIRATORY_TRACT | Status: AC
  Administered 2012-01-17: 10 mg/h via RESPIRATORY_TRACT

## 2012-01-17 MED ORDER — ALBUTEROL SULFATE (5 MG/ML) 0.5% IN NEBU
5.0000 mg | INHALATION_SOLUTION | Freq: Once | RESPIRATORY_TRACT | Status: AC
  Administered 2012-01-17: 5 mg via RESPIRATORY_TRACT
  Filled 2012-01-17: qty 1

## 2012-01-17 MED ORDER — ALBUTEROL SULFATE (5 MG/ML) 0.5% IN NEBU: 2.5000 mg | INHALATION_SOLUTION | RESPIRATORY_TRACT | Status: DC | PRN

## 2012-01-17 MED ORDER — POTASSIUM CHLORIDE 20 MEQ/15ML (10%) PO LIQD
40.0000 meq | Freq: Once | ORAL | Status: AC
  Administered 2012-01-17: 40 meq via ORAL
  Filled 2012-01-17: qty 30

## 2012-01-17 MED ORDER — CHLORHEXIDINE GLUCONATE 0.12 % MT SOLN
15.0000 mL | Freq: Two times a day (BID) | OROMUCOSAL | Status: DC
  Administered 2012-01-17 – 2012-01-18 (×4): 15 mL via OROMUCOSAL
  Filled 2012-01-17 (×6): qty 15

## 2012-01-17 MED ORDER — PIPERACILLIN-TAZOBACTAM IN DEX 2-0.25 GM/50ML IV SOLN
4.5000 g | Freq: Once | INTRAVENOUS | Status: AC
  Administered 2012-01-17: 4.5 g via INTRAVENOUS
  Filled 2012-01-17: qty 100

## 2012-01-17 MED ORDER — ENOXAPARIN SODIUM 40 MG/0.4ML ~~LOC~~ SOLN
40.0000 mg | SUBCUTANEOUS | Status: DC
  Administered 2012-01-17 – 2012-01-21 (×5): 40 mg via SUBCUTANEOUS
  Filled 2012-01-17 (×5): qty 0.4

## 2012-01-17 NOTE — Progress Notes (Signed)
I have seen and assessed patient and agree with Dr Lajoyce Lauber assessment and plan. Will cycle cardiac enzymes, check a urine legionella and pneumococcus antigen. Will check a magnesium and replace potasium. Thoracentesis pending. Will consult with PCCM and general surgery. Follow.

## 2012-01-17 NOTE — ED Notes (Signed)
Pt transported to ED by family after being found on floor in respiratory distress. Pt d/c yesterday from [facility] s/p hiatal hernia repair.

## 2012-01-17 NOTE — H&P (Signed)
PCP:   Cam Hai, CNM, CNM   Chief Complaint:  Shortness of breath  HPI: This is a 78 year old female with no significant illnesses. She was recently admitted and underwent a paraesophageal hernia repair. She tolerated the procedure well. She was admitted Thursday, discharged on Saturday. the patient states she had some mild shortness of breath at discharge, which continued to progress. She states on Sunday she was quite dyspneic, per her daughter-in-law know patient had significant to work of breathing. There is no complaint of any chest pains, there is some mild palpitations. She did have a cough which is nonproductive and developed some wheezing by Sunday. Sunday evening she sustained a mechanical fall, and was brought to the ER. There is no report of fevers. In the er the patient was quite dyspneic. She was placed on BiPAP . She's feeling better. History provided by the patient  and her daughter-in-law who is at the bedside.  Review of Systems: Positives bolded  anorexia, fever, weight loss,, vision loss, decreased hearing, hoarseness, chest pain, syncope, dyspnea on exertion, peripheral edema, balance deficits, hemoptysis, abdominal pain, melena, hematochezia, severe indigestion/heartburn, hematuria, incontinence, genital sores, muscle weakness, suspicious skin lesions, transient blindness, difficulty walking, depression, unusual weight change, abnormal bleeding, enlarged lymph nodes, angioedema, and breast masses.  Past Medical History: Past Medical History  Diagnosis Date  . Diverticulosis   . Osteoporosis   . Hypertension   . Iron deficiency anemia   . Cameron lesion, acute   . AVM (arteriovenous malformation)   . GERD (gastroesophageal reflux disease)   . Internal hemorrhoid   . Hiatal hernia   . Diverticulosis   . Cough   . Paraesophageal hernia   . Arthritis     hands    Past Surgical History  Procedure Date  . Colonscopy   . Polypectomy   . Cesarean section 01/1965    . Dilation and curettage of uterus     Medications: Prior to Admission medications   Medication Sig Start Date End Date Taking? Authorizing Provider  hydrochlorothiazide 25 MG tablet Take 12.5 mg by mouth daily after breakfast.    Yes Historical Provider, MD  metoprolol (TOPROL-XL) 50 MG 24 hr tablet Take 50 mg by mouth daily after breakfast.  10/09/11  Yes Historical Provider, MD  perphenazine-amitriptyline (ETRAFON/TRIAVIL) 2-10 MG TABS Take 2 tablets by mouth at bedtime.    Yes Historical Provider, MD  simvastatin (ZOCOR) 20 MG tablet Take 20 mg by mouth at bedtime.    Yes Historical Provider, MD  oxyCODONE (OXY IR/ROXICODONE) 5 MG immediate release tablet Take 1-2 tablets (5-10 mg total) by mouth every 4 (four) hours as needed. 01/15/12 01/25/12  Adolph Pollack, MD    Allergies:  No Known Allergies  Social History:  reports that she has never smoked. She has never used smokeless tobacco. She reports that she drinks about 10.8 ounces of alcohol per week. She reports that she does not use illicit drugs.  Family History: Family History  Problem Relation Age of Onset  . Prostate cancer Brother   . Colon cancer Neg Hx     Physical Exam: Filed Vitals:   01/17/12 0227 01/17/12 0257 01/17/12 0302 01/17/12 0350  BP:   162/65   Pulse:  108 106 121  Temp:      TempSrc:      Resp:  26 25 27   SpO2: 96% 95% 96% 99%    General:  Alert and oriented times three, well developed and nourished, BiPaP in place  Eyes: PERRLA, pink conjunctiva, no scleral icterus ENT: dry oral mucosa, neck supple, no thyromegaly Lungs: crackles, mild wheeze, some use of accessory muscles Cardiovascular: regular rate and rhythm, no regurgitation, no gallops, no murmurs. No carotid bruits, no JVD Abdomen: soft, positive BS, non-tender, non-distended, no organomegaly, not an acute abdomen GU: not examined Neuro: CN II - XII grossly intact, sensation intact Musculoskeletal: strength 5/5 all extremities, no  clubbing, cyanosis or edema Skin: no rash, no subcutaneous crepitation, no decubitus Psych: appropriate patient   Labs on Admission:   Basename 01/17/12 0115  NA 128*  K 2.9*  CL 91*  CO2 27  GLUCOSE 139*  BUN 10  CREATININE 0.61  CALCIUM 8.4  MG --  PHOS --  Results for ANNA-MARIE, COLLER (MRN [id number]) as of 01/17/2012 05:18  Ref. Range 01/17/2012 01:13  Pro B Natriuretic peptide (BNP) Latest Range: 0-450 pg/mL 2552.0 (H)   No results found for this basename: AST:2,ALT:2,ALKPHOS:2,BILITOT:2,PROT:2,ALBUMIN:2 in the last 72 hours No results found for this basename: LIPASE:2,AMYLASE:2 in the last 72 hours  Basename 01/17/12 0115  WBC 17.7*  NEUTROABS 15.4*  HGB 9.2*  HCT 27.9*  MCV 77.7*  PLT 279    Basename 01/17/12 0115  CKTOTAL 1026*  CKMB 4.9*  CKMBINDEX --  TROPONINI <0.30   No components found with this basename: POCBNP:3 No results found for this basename: DDIMER:2 in the last 72 hours No results found for this basename: HGBA1C:2 in the last 72 hours No results found for this basename: CHOL:2,HDL:2,LDLCALC:2,TRIG:2,CHOLHDL:2,LDLDIRECT:2 in the last 72 hours No results found for this basename: TSH,T4TOTAL,FREET3,T3FREE,THYROIDAB in the last 72 hours No results found for this basename: VITAMINB12:2,FOLATE:2,FERRITIN:2,TIBC:2,IRON:2,RETICCTPCT:2 in the last 72 hours  Micro Results: No results found for this or any previous visit (from the past 240 hour(s)).   Radiological Exams on Admission: Ct Angio Chest W/cm &/or Wo Cm  01/17/2012  *RADIOLOGY REPORT*  Clinical Data: Shortness of breath and cough; leukocytosis.  CT ANGIOGRAPHY CHEST  Technique:  Multidetector CT imaging of the chest using the standard protocol during bolus administration of intravenous contrast. Multiplanar reconstructed images including MIPs were obtained and reviewed to evaluate the vascular anatomy.  Contrast: OMNIPAQUE IOHEXOL 300 MG/ML IV SOLN  Comparison: Chest radiograph performed  earlier today at 01:15 a.m.  Findings: There is no evidence of central pulmonary embolus. Evaluation for pulmonary embolus is suboptimal in areas of airspace consolidation.  Moderate bilateral pleural effusions are noted, with associated partial consolidation of both lower lung lobes.  Underlying pneumonia cannot be excluded.  Mild interstitial prominence could reflect mild interstitial edema.  There is no evidence of pneumothorax.  No masses are identified; no abnormal focal contrast enhancement is seen.  An apparent pericardial cyst is noted, measuring 5.0 cm in size. The mediastinum status is otherwise unremarkable in appearance.  No suspicious mediastinal lymphadenopathy is seen.  No pericardial effusion is identified.  The great vessels are unremarkable in appearance.  No axillary lymphadenopathy is seen.  The thyroid gland is unremarkable in appearance.  The visualized portions of the liver and spleen are unremarkable.  No acute osseous abnormalities are seen.  IMPRESSION:  1.  No evidence of central pulmonary embolus. 2.  Moderate bilateral pleural effusions; mild interstitial prominence could reflect mild interstitial edema.  Associated partial consolidation of both lower lung lobes most likely reflects atelectasis, though pneumonia cannot be entirely excluded. 3.  Flattened 5.0 cm pericardial cyst noted.  Original Report Authenticated By: Tonia Ghent, M.D.   Dg Chest Portable  1 View  01/17/2012  *RADIOLOGY REPORT*  Clinical Data: Shortness of breath.  PORTABLE CHEST - 1 VIEW  Comparison: None.  Findings: The lungs are hypoexpanded; retrocardiac airspace opacification raises question for pneumonia.  Underlying vascular crowding and mild vascular congestion are noted; the appearance is less typical for edema.  No pneumothorax is seen; a small left pleural effusion is suspected.  The cardiomediastinal silhouette is borderline normal in size; calcification is noted within the aortic arch.  No acute osseous  abnormalities are seen.  IMPRESSION: Lungs hypoexpanded; retrocardiac airspace opacification raises concern for pneumonia.  The appearance is less typical for edema; suspect small left pleural effusion.  Original Report Authenticated By: Tonia Ghent, M.D.   EKG: NSR  Assessment/Plan Present on Admission:  .Acute respiratory failure .CHF (congestive heart failure) .Pleural effusion, bilateral Possible Hospital hospital-acquired pneumonia Multifactorial etiology for acute respiratory failure, admit to step down Continue BiPAP, obtain ABG Duo nebs and Lasix ordered Strict I/O's, Hep-Lock IV Repeat BNP and chest x-ray in a.m. 2-D echo ordered Cardiac enzymes cycled  Consult interventional radiology for possible thoracentesis  IV antibiotics, sputum cultures, blood culture ordered  Hypokalemia Hyponatremia Labs ordered and repeating  .HYPERTENSION Stable  .Praesophageal hiatal hernia - giant, irreducible Status post hernia repair and Thursday Consult Dr. Michaell Cowing in a.m. KUB  Full code DVT prophylaxis Team 3/Dr. Janee Morn  Critical care time Time in 4:45 AM  Time out 5:25AM   Addeline Calarco 01/17/2012, 5:21 AM

## 2012-01-17 NOTE — Progress Notes (Signed)
  Echocardiogram 2D Echocardiogram has been performed.  [name] [facility] 01/17/2012, 10:18 AM

## 2012-01-17 NOTE — ED Provider Notes (Signed)
History     CSN: [id number]  Arrival date & time 01/17/12  0059   First MD Initiated Contact with Patient 01/17/12 0103      Chief Complaint  Patient presents with  . Shortness of Breath    (Consider location/radiation/quality/duration/timing/severity/associated sxs/prior treatment) HPI Comments: The patient presents tonight for worsening shortness of breath.  Patient notes she's had some cough but no fevers or chest pain.  She was in the hospital until Saturday because she had a hernia repair on Thursday.  Patient had a normal postoperative course without complications.  She has no leg Swelling or leg pain.  No prior history of blood clots.  Patient denies any smoking history or asthma or COPD history.  Patient also notes 2 falls out of bed tonight but she did not have loss of consciousness and denies that she was lightheaded or dizzy.  Patient has not noted any palpitations. She is not on any pain medications since time of discharge from the hospital on Saturday.  Patient is a 78 y.o. female presenting with shortness of breath. The history is provided by the patient. No language interpreter was used.  Shortness of Breath  The current episode started today. The onset was gradual. The problem occurs continuously. The problem is severe. The symptoms are relieved by nothing. The symptoms are aggravated by activity. Associated symptoms include cough, shortness of breath and wheezing. Pertinent negatives include no chest pain, no chest pressure, no orthopnea, no fever, no rhinorrhea, no sore throat and no stridor.    Past Medical History  Diagnosis Date  . Diverticulosis   . Osteoporosis   . Hypertension   . Iron deficiency anemia   . Cameron lesion, acute   . AVM (arteriovenous malformation)   . GERD (gastroesophageal reflux disease)   . Internal hemorrhoid   . Hiatal hernia   . Diverticulosis   . Cough   . Paraesophageal hernia   . Arthritis     hands     Past Surgical History    Procedure Date  . Colonscopy   . Polypectomy   . Cesarean section 01/1965  . Dilation and curettage of uterus     Family History  Problem Relation Age of Onset  . Prostate cancer Brother   . Colon cancer Neg Hx     History  Substance Use Topics  . Smoking status: Never Smoker   . Smokeless tobacco: Never Used  . Alcohol Use: 10.8 oz/week    18 Glasses of wine per week     1 glasses daily    OB History    Grav Para Term Preterm Abortions TAB SAB Ect Mult Living                  Review of Systems  Constitutional: Negative.  Negative for fever and chills.  HENT: Negative.  Negative for sore throat and rhinorrhea.   Eyes: Negative.  Negative for discharge and redness.  Respiratory: Positive for cough, shortness of breath and wheezing. Negative for stridor.   Cardiovascular: Negative.  Negative for chest pain and orthopnea.  Gastrointestinal: Negative.  Negative for nausea, vomiting, abdominal pain and diarrhea.  Genitourinary: Negative.  Negative for dysuria and vaginal discharge.  Musculoskeletal: Negative.  Negative for back pain.  Skin: Negative.  Negative for color change and rash.  Neurological: Negative.  Negative for syncope and headaches.  Hematological: Negative.  Negative for adenopathy.  Psychiatric/Behavioral: Negative.  Negative for confusion.  All other systems reviewed and are negative.  Allergies  Review of patient's allergies indicates no known allergies.  Home Medications   Current Outpatient Rx  Name Route Sig Dispense Refill  . HYDROCHLOROTHIAZIDE 25 MG PO TABS Oral Take 12.5 mg by mouth daily after breakfast.     . METOPROLOL SUCCINATE ER 50 MG PO TB24 Oral Take 50 mg by mouth daily after breakfast.     . OXYCODONE HCL 5 MG PO TABS Oral Take 1-2 tablets (5-10 mg total) by mouth every 4 (four) hours as needed. 30 tablet 0  . PERPHENAZINE-AMITRIPTYLINE 2-10 MG PO TABS Oral Take 2 tablets by mouth at bedtime.     Marland Kitchen SIMVASTATIN 20 MG PO TABS  Oral Take 20 mg by mouth at bedtime.       BP 137/42  Pulse 94  Temp(Src) 99.6 F (37.6 C) (Oral)  Resp 24  SpO2 95%  Physical Exam  Nursing note and vitals reviewed. Constitutional: She is oriented to person, place, and time. She appears well-developed and well-nourished.  Non-toxic appearance. She does not have a sickly appearance.  HENT:  Head: Normocephalic and atraumatic.  Eyes: Conjunctivae, EOM and lids are normal. Pupils are equal, round, and reactive to light. No scleral icterus.  Neck: Trachea normal and normal range of motion. Neck supple.  Cardiovascular: Normal rate, regular rhythm and normal heart sounds.   Pulmonary/Chest: Effort normal and breath sounds normal.  Abdominal: Soft. Normal appearance. There is no tenderness. There is no rebound, no guarding and no CVA tenderness.  Musculoskeletal: Normal range of motion.  Neurological: She is alert and oriented to person, place, and time. She has normal strength.  Skin: Skin is warm, dry and intact. No rash noted.  Psychiatric: She has a normal mood and affect. Her behavior is normal. Judgment and thought content normal.    ED Course  Procedures (including critical care time)   Results for orders placed during the hospital encounter of 01/17/12  CBC      Component Value Range   WBC 17.7 (*) 4.0 - 10.5 (K/uL)   RBC 3.59 (*) 3.87 - 5.11 (MIL/uL)   Hemoglobin 9.2 (*) 12.0 - 15.0 (g/dL)   HCT 16.1 (*) 09.6 - 46.0 (%)   MCV 77.7 (*) 78.0 - 100.0 (fL)   MCH 25.6 (*) 26.0 - 34.0 (pg)   MCHC 33.0  30.0 - 36.0 (g/dL)   RDW 04.5 (*) 40.9 - 15.5 (%)   Platelets 279  150 - 400 (K/uL)  DIFFERENTIAL      Component Value Range   Neutrophils Relative 87 (*) 43 - 77 (%)   Lymphocytes Relative 5 (*) 12 - 46 (%)   Monocytes Relative 8  3 - 12 (%)   Eosinophils Relative 0  0 - 5 (%)   Basophils Relative 0  0 - 1 (%)   Neutro Abs 15.4 (*) 1.7 - 7.7 (K/uL)   Lymphs Abs 0.9  0.7 - 4.0 (K/uL)   Monocytes Absolute 1.4 (*) 0.1 -  1.0 (K/uL)   Eosinophils Absolute 0.0  0.0 - 0.7 (K/uL)   Basophils Absolute 0.0  0.0 - 0.1 (K/uL)   RBC Morphology POLYCHROMASIA PRESENT     WBC Morphology TOXIC GRANULATION    BASIC METABOLIC PANEL      Component Value Range   Sodium 128 (*) 135 - 145 (mEq/L)   Potassium 2.9 (*) 3.5 - 5.1 (mEq/L)   Chloride 91 (*) 96 - 112 (mEq/L)   CO2 27  19 - 32 (mEq/L)   Glucose, Bld  139 (*) 70 - 99 (mg/dL)   BUN 10  6 - 23 (mg/dL)   Creatinine, Ser 7.82  0.50 - 1.10 (mg/dL)   Calcium 8.4  8.4 - 95.6 (mg/dL)   GFR calc non Af Amer 85 (*) >90 (mL/min)   GFR calc Af Amer >90  >90 (mL/min)  CARDIAC PANEL(CRET KIN+CKTOT+MB+TROPI)      Component Value Range   Total CK 1026 (*) 7 - 177 (U/L)   CK, MB 4.9 (*) 0.3 - 4.0 (ng/mL)   Troponin I <0.30  <0.30 (ng/mL)   Relative Index 0.5  0.0 - 2.5   PRO B NATRIURETIC PEPTIDE      Component Value Range   Pro B Natriuretic peptide (BNP) 2552.0 (*) 0 - 450 (pg/mL)   Ct Angio Chest W/cm &/or Wo Cm  01/17/2012  *RADIOLOGY REPORT*  Clinical Data: Shortness of breath and cough; leukocytosis.  CT ANGIOGRAPHY CHEST  Technique:  Multidetector CT imaging of the chest using the standard protocol during bolus administration of intravenous contrast. Multiplanar reconstructed images including MIPs were obtained and reviewed to evaluate the vascular anatomy.  Contrast: OMNIPAQUE IOHEXOL 300 MG/ML IV SOLN  Comparison: Chest radiograph performed earlier today at 01:15 a.m.  Findings: There is no evidence of central pulmonary embolus. Evaluation for pulmonary embolus is suboptimal in areas of airspace consolidation.  Moderate bilateral pleural effusions are noted, with associated partial consolidation of both lower lung lobes.  Underlying pneumonia cannot be excluded.  Mild interstitial prominence could reflect mild interstitial edema.  There is no evidence of pneumothorax.  No masses are identified; no abnormal focal contrast enhancement is seen.  An apparent pericardial  cyst is noted, measuring 5.0 cm in size. The mediastinum status is otherwise unremarkable in appearance.  No suspicious mediastinal lymphadenopathy is seen.  No pericardial effusion is identified.  The great vessels are unremarkable in appearance.  No axillary lymphadenopathy is seen.  The thyroid gland is unremarkable in appearance.  The visualized portions of the liver and spleen are unremarkable.  No acute osseous abnormalities are seen.  IMPRESSION:  1.  No evidence of central pulmonary embolus. 2.  Moderate bilateral pleural effusions; mild interstitial prominence could reflect mild interstitial edema.  Associated partial consolidation of both lower lung lobes most likely reflects atelectasis, though pneumonia cannot be entirely excluded. 3.  Flattened 5.0 cm pericardial cyst noted.  Original Report Authenticated By: Tonia Ghent, M.D.   Dg Chest Portable 1 View  01/17/2012  *RADIOLOGY REPORT*  Clinical Data: Shortness of breath.  PORTABLE CHEST - 1 VIEW  Comparison: None.  Findings: The lungs are hypoexpanded; retrocardiac airspace opacification raises question for pneumonia.  Underlying vascular crowding and mild vascular congestion are noted; the appearance is less typical for edema.  No pneumothorax is seen; a small left pleural effusion is suspected.  The cardiomediastinal silhouette is borderline normal in size; calcification is noted within the aortic arch.  No acute osseous abnormalities are seen.  IMPRESSION: Lungs hypoexpanded; retrocardiac airspace opacification raises concern for pneumonia.  The appearance is less typical for edema; suspect small left pleural effusion.  Original Report Authenticated By: Tonia Ghent, M.D.     Date: 01/17/2012  Rate: 95  Rhythm: normal sinus rhythm  QRS Axis: normal  Intervals: normal  ST/T Wave abnormalities: nonspecific ST/T changes  Conduction Disutrbances:right bundle branch block  Narrative Interpretation:   Old EKG Reviewed: unchanged from Jan 13 2012    MDM  Patient presents with significant respiratory distress but still  able to speak in moderate to normal sentences.  Her initial picture was unclear given that she presented with some cough after recent hospitalization concerning for possible pneumonia.  Her initial chest x-ray was difficult to read but showed possible effusions versus pneumonia but was a poor inspiratory effort due to patient's distress.  Nevertheless I did administer antibiotics but mistakenly did not initially administer hospital-acquired anabiotic switch the patient is now going to receive which include vancomycin and Zosyn.  Patient came back with an elevated BNP concerning for CHF and so the patient was given a dose of Lasix and was to have a Foley catheter placed for careful monitoring of her eyes and nose.  Patient has no known history of coronary disease or CHF so this is a new phenomenon for this patient.  Given patient's recent hospitalization and surgery others concern for possible underlying pulmonary embolus and I did obtain a CT angiogram which did not show significant PE but does show the pleural effusions with possible pneumonia but is difficult to tell given the presence of the effusions.  Patient is receiving antibiotics as her dimension.  I am adding on a lactic acid and protested tone and to this patient's studies the nurses said are you drawn blood cultures and held them prior to initial antibiotics.  These will not be added to her labs as well.  Patient has also been receiving nebulizer treatments for her wheezing which she subjectively reports is helping her.  Patient is still calm and able to speak in sentences without significant difficulty.  Patient is showing signs that she's had increased work of breathing though so she is going to go on BiPAP at this time.  I anticipate admission of this patient to the medical ICU and on contacting the [facility] hospitalist service for admission of this patient to the  intensive care unit for further management of her possible hospital-acquired pneumonia as well as new pleural effusions.  CRITICAL CARE Performed by: Emeline General A   Total critical care time: 58 minutes  Critical care time was exclusive of separately billable procedures and treating other patients.  Critical care was necessary to treat or prevent imminent or life-threatening deterioration.  Critical care was time spent personally by me on the following activities: development of treatment plan with patient and/or surrogate as well as nursing, discussions with consultants, evaluation of patient's response to treatment, examination of patient, obtaining history from patient or surrogate, ordering and performing treatments and interventions, ordering and review of laboratory studies, ordering and review of radiographic studies, pulse oximetry and re-evaluation of patient's condition.         Nat Christen, MD 01/17/12 [phone]

## 2012-01-17 NOTE — Progress Notes (Signed)
Subjective: Asked to see at the request of Dr Michaell Cowing.  She is s/p lap HH repair and fundoplication.  Admitted for SOB.  Denies abdominal or chest pain  Objective: Vital signs in last 24 hours: Temp:  [98.7 F (37.1 C)-99.8 F (37.7 C)] 98.7 F (37.1 C) (01/21 0800) Pulse Rate:  [93-121] 93  (01/21 1200) Resp:  [19-27] 20  (01/21 1200) BP: (116-162)/(42-74) 124/60 mmHg (01/21 1200) SpO2:  [86 %-99 %] 98 % (01/21 1200) FiO2 (%):  [40 %] 40 % (01/21 0800) Weight:  [154 lb 1.6 oz (69.9 kg)] 154 lb 1.6 oz (69.9 kg) (01/21 0640)    Intake/Output from previous day: 01/20 0701 - 01/21 0700 In: -  Out: 1200 [Urine:1200] Intake/Output this shift: Total I/O In: 262.5 [P.O.:240; IV Piggyback:22.5] Out: 1600 [Urine:1600]  Incision/Wound:clean intact soft NT abdomen  Lab Results:   [facility] 01/17/12 0733 01/17/12 0115  WBC 17.3* 17.7*  HGB 9.1* 9.2*  HCT 27.9* 27.9*  PLT 278 279   BMET  Basename 01/17/12 0733 01/17/12 0115  NA -- 128*  K -- 2.9*  CL -- 91*  CO2 -- 27  GLUCOSE -- 139*  BUN -- 10  CREATININE 0.69 0.61  CALCIUM -- 8.4   PT/INR No results found for this basename: LABPROT:2,INR:2 in the last 72 hours ABG  Basename 01/17/12 0525  PHART 7.457*  HCO3 27.8*    Studies/Results: Ct Angio Chest W/cm &/or Wo Cm  01/17/2012  *RADIOLOGY REPORT*  Clinical Data: Shortness of breath and cough; leukocytosis.  CT ANGIOGRAPHY CHEST  Technique:  Multidetector CT imaging of the chest using the standard protocol during bolus administration of intravenous contrast. Multiplanar reconstructed images including MIPs were obtained and reviewed to evaluate the vascular anatomy.  Contrast: OMNIPAQUE IOHEXOL 300 MG/ML IV SOLN  Comparison: Chest radiograph performed earlier today at 01:15 a.m.  Findings: There is no evidence of central pulmonary embolus. Evaluation for pulmonary embolus is suboptimal in areas of airspace consolidation.  Moderate bilateral pleural effusions are  noted, with associated partial consolidation of both lower lung lobes.  Underlying pneumonia cannot be excluded.  Mild interstitial prominence could reflect mild interstitial edema.  There is no evidence of pneumothorax.  No masses are identified; no abnormal focal contrast enhancement is seen.  An apparent pericardial cyst is noted, measuring 5.0 cm in size. The mediastinum status is otherwise unremarkable in appearance.  No suspicious mediastinal lymphadenopathy is seen.  No pericardial effusion is identified.  The great vessels are unremarkable in appearance.  No axillary lymphadenopathy is seen.  The thyroid gland is unremarkable in appearance.  The visualized portions of the liver and spleen are unremarkable.  No acute osseous abnormalities are seen.  IMPRESSION:  1.  No evidence of central pulmonary embolus. 2.  Moderate bilateral pleural effusions; mild interstitial prominence could reflect mild interstitial edema.  Associated partial consolidation of both lower lung lobes most likely reflects atelectasis, though pneumonia cannot be entirely excluded. 3.  Flattened 5.0 cm pericardial cyst noted.  Original Report Authenticated By: Tonia Ghent, M.D.   Dg Chest Portable 1 View  01/17/2012  *RADIOLOGY REPORT*  Clinical Data: Shortness of breath.  PORTABLE CHEST - 1 VIEW  Comparison: None.  Findings: The lungs are hypoexpanded; retrocardiac airspace opacification raises question for pneumonia.  Underlying vascular crowding and mild vascular congestion are noted; the appearance is less typical for edema.  No pneumothorax is seen; a small left pleural effusion is suspected.  The cardiomediastinal silhouette is borderline normal in  size; calcification is noted within the aortic arch.  No acute osseous abnormalities are seen.  IMPRESSION: Lungs hypoexpanded; retrocardiac airspace opacification raises concern for pneumonia.  The appearance is less typical for edema; suspect small left pleural effusion.  Original  Report Authenticated By: Tonia Ghent, M.D.    Anti-infectives: Anti-infectives     Start     Dose/Rate Route Frequency Ordered Stop   01/17/12 1700   vancomycin (VANCOCIN) 500 mg in sodium chloride 0.9 % 100 mL IVPB        500 mg 100 mL/hr over 60 Minutes Intravenous Every 12 hours 01/17/12 0651     01/17/12 1300   piperacillin-tazobactam (ZOSYN) IVPB 3.375 g        3.375 g 12.5 mL/hr over 240 Minutes Intravenous Every 8 hours 01/17/12 0651     01/17/12 0415   vancomycin (VANCOCIN) injection 1 g  Status:  Discontinued        1 g Intravenous  Once 01/17/12 0350 01/17/12 0401   01/17/12 0415   piperacillin-tazobactam (ZOSYN) IVPB 4.5 g        4.5 g 200 mL/hr over 30 Minutes Intravenous  Once 01/17/12 0350 01/17/12 0452   01/17/12 0415   vancomycin (VANCOCIN) IVPB 1000 mg/200 mL premix        1,000 mg 200 mL/hr over 60 Minutes Intravenous To Emergency Dept 01/17/12 0401 01/17/12 0604   01/17/12 0145   azithromycin (ZITHROMAX) 500 mg in dextrose 5 % 250 mL IVPB        500 mg 250 mL/hr over 60 Minutes Intravenous  Once 01/17/12 0141 01/17/12 0410   01/17/12 0145   cefTRIAXone (ROCEPHIN) 1 g in dextrose 5 % 50 mL IVPB        1 g 100 mL/hr over 30 Minutes Intravenous  Once 01/17/12 0141 01/17/12 0243          Assessment/Plan: s/p HH repair and fundoplication admitted with SOB  And PULMONARY EFFUSIONS Stable surgically Dr Michaell Cowing to follow up.   LOS: 0 days    Marlies Ligman A. 01/17/2012

## 2012-01-17 NOTE — ED Notes (Signed)
Patient transferred to Res. A and report to the RN

## 2012-01-17 NOTE — Procedures (Signed)
Procedure : right thoracentesis Specimen : 630 ml bloody serous fluid Complications : none immediate  Specimen sent to the lab for analysis

## 2012-01-17 NOTE — ED Notes (Signed)
2nd IV est for addt medication administration. 2 sets of BC drawn prior to start of antibiotics will hold until ordered. Pt  temporarily slowed breathing then returned to slight distress, RT returned to bedside for 1 hour neb tx. Family remains at bedside as well as Careers adviser on call.

## 2012-01-17 NOTE — Progress Notes (Signed)
ANTIBIOTIC CONSULT NOTE - INITIAL  Pharmacy Consult for Vancomycin & Zosyn Indication: HCAP  No Known Allergies  Patient Measurements: Height: 5' 1.02" (155 cm) Weight: 154 lb 1.6 oz (69.9 kg) IBW/kg (Calculated) : 47.86     Vital Signs: Temp: 99.8 F (37.7 C) (01/21 0625) Temp src: Oral (01/21 0625) BP: 135/74 mmHg (01/21 0625) Pulse Rate: 121  (01/21 0350) Intake/Output from previous day:   Intake/Output from this shift:    Labs:  Basename 01/17/12 0115  WBC 17.7*  HGB 9.2*  PLT 279  LABCREA --  CREATININE 0.61   Estimated Creatinine Clearance: 52.7 ml/min (by C-G formula based on Cr of 0.61). No results found for this basename: VANCOTROUGH:2,VANCOPEAK:2,VANCORANDOM:2,GENTTROUGH:2,GENTPEAK:2,GENTRANDOM:2,TOBRATROUGH:2,TOBRAPEAK:2,TOBRARND:2,AMIKACINPEAK:2,AMIKACINTROU:2,AMIKACIN:2, in the last 72 hours   Microbiology: Recent Results (from the past 720 hour(s))  SURGICAL PCR SCREEN     Status: Normal   Collection Time   01/06/12  8:32 AM      Component Value Range Status Comment   MRSA, PCR NEGATIVE  NEGATIVE  Final    Staphylococcus aureus NEGATIVE  NEGATIVE  Final     Medical History: Past Medical History  Diagnosis Date  . Diverticulosis   . Osteoporosis   . Hypertension   . Iron deficiency anemia   . Cameron lesion, acute   . AVM (arteriovenous malformation)   . GERD (gastroesophageal reflux disease)   . Internal hemorrhoid   . Hiatal hernia   . Diverticulosis   . Cough   . Paraesophageal hernia   . Arthritis     hands     Medications:  Scheduled:    . albuterol  5 mg Nebulization Once  . albuterol  10 mg/hr Nebulization Once  . azithromycin (ZITHROMAX) 500 MG IVPB  500 mg Intravenous Once  . cefTRIAXone (ROCEPHIN)  IV  1 g Intravenous Once  . enoxaparin  40 mg Subcutaneous Q24H  . furosemide  40 mg Intravenous Once  . furosemide  40 mg Intravenous Q12H  . ipratropium  0.5 mg Nebulization Once  . metoprolol succinate  50 mg Oral QPC  breakfast  . perphenazine-amitriptyline  2 tablet Oral QHS  . piperacillin-tazobactam (ZOSYN)  IV  4.5 g Intravenous Once  . potassium chloride  10 mEq Intravenous Once  . potassium chloride  40 mEq Oral Once  . potassium chloride  40 mEq Oral Once  . vancomycin  1,000 mg Intravenous To ER  . DISCONTD: vancomycin  1 g Intravenous Once   Infusions:   Assessment: 78 year old female with recent hospitalization and underwent hernia repair.  Pt noted to have had some shortness of breath at discharge this past Saturday.  CrCl = 52 ml/min.  Patient received Vancomycin 1gm IV x 1 in ED @ 05:04 and Zosyn IV x 1 in ED @ 04:22.  IV Vancomycin and Zosyn to continue for HCAP.  Goal of Therapy:  Vancomycin trough level 15-20 mcg/ml  Plan:  1.  Zosyn 3.375gm IV q8h (extended infusion) 2.  Vancomycin 500mg  IV q12h 3.  Check vancomycin trough level as needed.  Kendrah Lovern, Joselyn Glassman 01/17/2012,6:42 AM

## 2012-01-18 ENCOUNTER — Inpatient Hospital Stay (HOSPITAL_COMMUNITY): Payer: Medicare Other

## 2012-01-18 DIAGNOSIS — E876 Hypokalemia: Secondary | ICD-10-CM | POA: Diagnosis present

## 2012-01-18 DIAGNOSIS — R410 Disorientation, unspecified: Secondary | ICD-10-CM | POA: Diagnosis present

## 2012-01-18 LAB — CBC
MCHC: 31.4 g/dL (ref 30.0–36.0)
RDW: 16 % — ABNORMAL HIGH (ref 11.5–15.5)
WBC: 15.7 10*3/uL — ABNORMAL HIGH (ref 4.0–10.5)

## 2012-01-18 LAB — IRON AND TIBC
Iron: 14 ug/dL — ABNORMAL LOW (ref 42–135)
TIBC: 254 ug/dL (ref 250–470)

## 2012-01-18 LAB — BASIC METABOLIC PANEL
BUN: 9 mg/dL (ref 6–23)
Creatinine, Ser: 0.79 mg/dL (ref 0.50–1.10)
GFR calc Af Amer: >90 mL/min (ref >90)
GFR calc non Af Amer: 78 mL/min — ABNORMAL LOW (ref >90)
Potassium: 3.1 mEq/L — ABNORMAL LOW (ref 3.5–5.1)

## 2012-01-18 LAB — MAGNESIUM: Magnesium: 2 mg/dL (ref 1.5–2.5)

## 2012-01-18 LAB — VITAMIN B12: Vitamin B-12: 640 pg/mL (ref 211–911)

## 2012-01-18 LAB — DIFFERENTIAL
Basophils Absolute: 0 10*3/uL (ref 0.0–0.1)
Basophils Relative: 0 % (ref 0–1)
Neutro Abs: 12.9 10*3/uL — ABNORMAL HIGH (ref 1.7–7.7)
Neutrophils Relative %: 82 % — ABNORMAL HIGH (ref 43–77)

## 2012-01-18 LAB — FOLATE: Folate: 6.2 ng/mL

## 2012-01-18 LAB — PATHOLOGIST SMEAR REVIEW

## 2012-01-18 LAB — FERRITIN: Ferritin: 447 ng/mL — ABNORMAL HIGH (ref 10–291)

## 2012-01-18 LAB — PRO B NATRIURETIC PEPTIDE: Pro B Natriuretic peptide (BNP): 1181 pg/mL — ABNORMAL HIGH (ref 0–450)

## 2012-01-18 LAB — RPR: RPR Ser Ql: NONREACTIVE

## 2012-01-18 MED ORDER — POTASSIUM CHLORIDE CRYS ER 20 MEQ PO TBCR
40.0000 meq | EXTENDED_RELEASE_TABLET | ORAL | Status: AC
  Administered 2012-01-18 (×2): 40 meq via ORAL
  Filled 2012-01-18 (×2): qty 2

## 2012-01-18 MED ORDER — BISACODYL 10 MG RE SUPP: 10.0000 mg | Freq: Two times a day (BID) | RECTAL | Status: DC | PRN

## 2012-01-18 MED ORDER — PSYLLIUM 95 % PO PACK
1.0000 | PACK | Freq: Two times a day (BID) | ORAL | Status: DC
  Administered 2012-01-18 – 2012-01-19 (×2): 1 via ORAL
  Filled 2012-01-18 (×8): qty 1

## 2012-01-18 MED ORDER — MAGNESIUM HYDROXIDE 400 MG/5ML PO SUSP: 30.0000 mL | Freq: Three times a day (TID) | ORAL | Status: DC | PRN

## 2012-01-18 MED ORDER — FLORA-Q PO CAPS
1.0000 | ORAL_CAPSULE | Freq: Every day | ORAL | Status: DC
  Administered 2012-01-18: 1 via ORAL
  Filled 2012-01-18 (×2): qty 1

## 2012-01-18 NOTE — Clinical Documentation Improvement (Signed)
CHF DOCUMENTATION CLARIFICATION QUERY  THIS DOCUMENT IS NOT A PERMANENT PART OF THE MEDICAL RECORD  TO RESPOND TO THE THIS QUERY, FOLLOW THE INSTRUCTIONS BELOW:  1. If needed, update documentation for the patient's encounter via the notes activity.  2. Access this query again and click edit on the In [employer].  3. After updating, or not, click F2 to complete all highlighted (required) fields concerning your review. Select "additional documentation in the medical record" OR "no additional documentation provided".  4. Click Sign note button.  5. The deficiency will fall out of your In Basket *Please let us know if you are not able to complete this workflow by phone or e-mail (listed below).  Please update your documentation within the medical record to reflect your response to this query.                                                                                    01/18/12  Dear Dr. Isla Pence and/or Associates,  In a better effort to capture your patient's severity of illness, reflect appropriate length of stay and utilization of resources, a review of the patient medical record has revealed the following indicators the diagnosis of Heart Failure.    Based on your clinical judgment, please clarify and document in a progress note and/or discharge summary the clinical condition associated with the following supporting information:  In responding to this query please exercise your independent judgment.  The fact that a query is asked, does not imply that any particular answer is desired or expected.  01/18/12 Progr Note..." Acute CHF exac..." For accurate Dx specificity & severity please help clarify if "type" of CHF can be further specified. Thank you  Possible Clinical Conditions? Potential Diagnosis:  . CHF NOS . Left Heart Failure . Systolic or Diastolic Congestive Heart Failure . Systolic & Diastolic Congestive Heart Failure  . Chronic Systolic or Diastolic Congestive  Heart Failure . Chronic Systolic & Diastolic Congestive Heart Failure  . Acute Systolic or Disatolic Congestive Heart Failure . Acute Systolic & Diastolic Congestive Heart Failure . Acute on Chronic Systolic or Diastolic Congestive Heart Failure . Acute on Chronic Systolic & Diastolic Congestive Heart Failure  . Other Condition . Cannot Clinically Determine  Supporting Information: Risk Factors:  Signs & Symptoms: 01/18/12 Progr note.." Acute CHF exac: Likely secondary to volume overload from recent surgery. Cardiac enzymes negative x 3. 2 d echo with nl EF and NWMA. I/O = -3.2L. Continue strict I/O daily weights, diuretics, metoprolol. Follow.".Marland KitchenMarland Kitchen  Lab: 01/17/12: BNP 2552  Echo results: EF: 01/17/12 see above note EKG: Radiology:  01/18/12: s/p R Thoracentesis: PORTABLE CHEST - 1 VIEW Comparison: Portable chest x-ray of 01/17/2012 Findings: There is little change in aeration with basilar atelectasis and probable effusions left greater than right. Cardiomegaly is stable  Treatment: 01/18/12: see above note,  01/17/12: R Thoracentesis  Reviewed:  no additional documentation provided: 01/20/12 Query f/u x2, no add doc/clarification noted by MD. Closed, ORM  Thank You,  Toribio Harbour, RN, BSN, CCDS Certified Clinical Documentation Specialist Pager: [phone]  Health Information Management [place]

## 2012-01-18 NOTE — Progress Notes (Signed)
Subjective: Per nursing patient with some confusion last night and agitation, pulling out line and FC and wondering in hallway. Family states since surgery patient has had bouts of confusion. Patient off BIPAP since yesterday. Patient denies SOB, No CP. Feeling better.  Objective: Vital signs in last 24 hours: Filed Vitals:   01/18/12 0200 01/18/12 0405 01/18/12 0425 01/18/12 0800  BP: 115/47 104/38 123/88   Pulse: 40 58 86   Temp:    97.5 F (36.4 C)  TempSrc:    Oral  Resp: 20  23   Height:      Weight:      SpO2: 85% 86% 92%     Intake/Output Summary (Last 24 hours) at 01/18/12 0858 Last data filed at 01/18/12 1610  Gross per 24 hour  Intake  466.5 ml  Output   2375 ml  Net -1908.5 ml    Weight change:   General: Alert, awake, oriented x3, in no acute distress. HEENT: No bruits, no goiter. Heart: Regular rate and rhythm, without murmurs, rubs, gallops. Lungs: Decreased BS in bases. scaterred crackles. Abdomen: Soft, nontender, nondistended, positive bowel sounds. Extremities: No clubbing cyanosis or edema with positive pedal pulses.   Lab Results:  Basename 01/18/12 0301 01/17/12 1548 01/17/12 0733  NA 133* 133* --  K 3.1* 3.2* --  CL 93* 90* --  CO2 30 31 --  GLUCOSE 113* 130* --  BUN 9 6 --  CREATININE 0.79 0.75 --  CALCIUM 7.6* 7.7* --  MG 2.0 -- 1.9  PHOS -- -- --    Basename 01/17/12 0733  AST --  ALT --  ALKPHOS --  BILITOT --  PROT 6.4  ALBUMIN --   No results found for this basename: LIPASE:2,AMYLASE:2 in the last 72 hours  Basename 01/18/12 0301 01/17/12 0733 01/17/12 0115  WBC 15.7* 17.3* --  NEUTROABS 12.9* -- 15.4*  HGB 8.9* 9.1* --  HCT 28.3* 27.9* --  MCV 78.0 78.2 --  PLT 272 278 --    Basename 01/17/12 1548 01/17/12 0733 01/17/12 0115  CKTOTAL 783* 880* 1026*  CKMB 2.8 4.1* 4.9*  CKMBINDEX -- -- --  TROPONINI <0.30 <0.30 <0.30   No components found with this basename: POCBNP:3 No results found for this basename: DDIMER:2  in the last 72 hours No results found for this basename: HGBA1C:2 in the last 72 hours  Basename 01/17/12 0733  CHOL 97  HDL 35*  LDLCALC 44  TRIG 91  CHOLHDL 2.8  LDLDIRECT --    Basename 01/17/12 0733  TSH 1.928  T4TOTAL --  T3FREE --  THYROIDAB --   No results found for this basename: VITAMINB12:2,FOLATE:2,FERRITIN:2,TIBC:2,IRON:2,RETICCTPCT:2 in the last 72 hours  Micro Results: Recent Results (from the past 240 hour(s))  MRSA PCR SCREENING     Status: Normal   Collection Time   01/17/12  7:24 AM      Component Value Range Status Comment   MRSA by PCR NEGATIVE  NEGATIVE  Final   BODY FLUID CULTURE     Status: Normal (Preliminary result)   Collection Time   01/17/12  2:21 PM      Component Value Range Status Comment   Specimen Description THORACENTESIS   Final    Special Requests NONE   Final    Gram Stain     Final    Value: FEW WBC PRESENT, PREDOMINANTLY MONONUCLEAR     NO ORGANISMS SEEN   Culture PENDING   Incomplete    Report Status PENDING  Incomplete     Studies/Results: Ct Angio Chest W/cm &/or Wo Cm  01/17/2012  *RADIOLOGY REPORT*  Clinical Data: Shortness of breath and cough; leukocytosis.  CT ANGIOGRAPHY CHEST  Technique:  Multidetector CT imaging of the chest using the standard protocol during bolus administration of intravenous contrast. Multiplanar reconstructed images including MIPs were obtained and reviewed to evaluate the vascular anatomy.  Contrast: OMNIPAQUE IOHEXOL 300 MG/ML IV SOLN  Comparison: Chest radiograph performed earlier today at 01:15 a.m.  Findings: There is no evidence of central pulmonary embolus. Evaluation for pulmonary embolus is suboptimal in areas of airspace consolidation.  Moderate bilateral pleural effusions are noted, with associated partial consolidation of both lower lung lobes.  Underlying pneumonia cannot be excluded.  Mild interstitial prominence could reflect mild interstitial edema.  There is no evidence of  pneumothorax.  No masses are identified; no abnormal focal contrast enhancement is seen.  An apparent pericardial cyst is noted, measuring 5.0 cm in size. The mediastinum status is otherwise unremarkable in appearance.  No suspicious mediastinal lymphadenopathy is seen.  No pericardial effusion is identified.  The great vessels are unremarkable in appearance.  No axillary lymphadenopathy is seen.  The thyroid gland is unremarkable in appearance.  The visualized portions of the liver and spleen are unremarkable.  No acute osseous abnormalities are seen.  IMPRESSION:  1.  No evidence of central pulmonary embolus. 2.  Moderate bilateral pleural effusions; mild interstitial prominence could reflect mild interstitial edema.  Associated partial consolidation of both lower lung lobes most likely reflects atelectasis, though pneumonia cannot be entirely excluded. 3.  Flattened 5.0 cm pericardial cyst noted.  Original Report Authenticated By: Tonia Ghent, M.D.   Dg Chest Port 1 View  01/18/2012  *RADIOLOGY REPORT*  Clinical Data: Congestive heart failure, follow-up  PORTABLE CHEST - 1 VIEW  Comparison: Portable chest x-ray of 01/17/2012  Findings: There is little change in aeration with basilar atelectasis and probable effusions left greater than right. Cardiomegaly is stable.  No bony abnormality is seen.  IMPRESSION: No significant change in suboptimal aeration with basilar atelectasis and probable effusions left greater than left.  Original Report Authenticated By: Juline Patch, M.D.   Dg Chest Port 1 View  01/17/2012  *RADIOLOGY REPORT*  Clinical Data: Status post right thoracentesis.  Evaluate for pneumothorax.  PORTABLE CHEST - 1 VIEW  Comparison: 01/17/2012  Findings: There are bilateral pleural effusions left greater than right.  When compared with the previous exam there is been decrease in volume of the right effusion.  No pneumothorax is identified.  There is atelectasis identified in both lung bases.   IMPRESSION:  1.  Decrease in volume of right pleural effusion. 2.  No pneumothorax identified.  Original Report Authenticated By: Rosealee Albee, M.D.   Dg Chest Portable 1 View  01/17/2012  *RADIOLOGY REPORT*  Clinical Data: Shortness of breath.  PORTABLE CHEST - 1 VIEW  Comparison: None.  Findings: The lungs are hypoexpanded; retrocardiac airspace opacification raises question for pneumonia.  Underlying vascular crowding and mild vascular congestion are noted; the appearance is less typical for edema.  No pneumothorax is seen; a small left pleural effusion is suspected.  The cardiomediastinal silhouette is borderline normal in size; calcification is noted within the aortic arch.  No acute osseous abnormalities are seen.  IMPRESSION: Lungs hypoexpanded; retrocardiac airspace opacification raises concern for pneumonia.  The appearance is less typical for edema; suspect small left pleural effusion.  Original Report Authenticated By: Tonia Ghent, M.D.  US Thoracentesis Asp Pleural Space W/img Guide  01/17/2012  *RADIOLOGY REPORT*  Clinical Data: Bilateral pleural effusions of uncertain etiology. Request has been made for therapeutic and diagnostic thoracentesis  ULTRASOUND GUIDED right THORACENTESIS  Comparison:  Prior chest x-rays and ultrasound performed today revealed the right effusion was larger than the left.  Due to the patient's confusion, written consent was obtained from the patient's son in regards to the thoracentesis procedure.  Ultrasound was performed to localize and mark an adequate pocket of fluid in the right chest.  The area was then prepped and draped in the normal sterile fashion.  1% Lidocaine was used for local anesthesia.  Under ultrasound guidance a 19 gauge Yueh catheter was introduced.  Thoracentesis was performed.  The catheter was removed and a dressing applied.  Complications:  None immediate  Findings: A total of approximately 630 ml of bloody serous fluid was removed. A fluid  sample was sent for laboratory analysis.  IMPRESSION: Successful ultrasound guided right thoracentesis yielding 630 ml of pleural fluid.  Read by: Anselm Pancoast, P.A.-C  Original Report Authenticated By: Reola Calkins, M.D.    Medications:     . perphenazine  4 mg Oral QHS   And  . amitriptyline  20 mg Oral QHS  . antiseptic oral rinse  15 mL Mouth Rinse q12n4p  . chlorhexidine  15 mL Mouth Rinse BID  . enoxaparin  40 mg Subcutaneous Q24H  . furosemide  40 mg Intravenous Q12H  . metoprolol succinate  50 mg Oral QPC breakfast  . piperacillin-tazobactam (ZOSYN)  IV  3.375 g Intravenous Q8H  . potassium chloride  40 mEq Oral Once  . potassium chloride  40 mEq Oral Q4H  . vancomycin  500 mg Intravenous Q12H    Assessment: Principal Problem:  *Pleural effusion, bilateral Active Problems:  HYPERTENSION  ARTERIOVENOUS MALFORMATION  Praesophageal hiatal hernia - giant, irreducible  Acute respiratory failure  CHF (congestive heart failure)  Hypokalemia  Confusion   Plan: 1. Acute Respiratory Failure Likely multifactorial secondary to acute CHF exac/volume overload and prob PNA and B pleural effusions. Clinical improvement. Off Bipap since yesterday morning with sats in 90s. BNP trending down. Negative 3.2L net output. Cardiac enzyme negative. 2 d echo with nl EF and NWMA. Continue diureses, oxygen, antibiotics. Follow. 2. Confusion Unknown etiology. Family states pt with confusion that has been intermitent since surgery, and patient had falls at home. Patient is neurologically intact. Will check a CT head, B12,RPR,RBC folate. Patient currently not on any anxiolytics here and on chronic elavil which has been continued. Will follow. If CT head negative will check MRI of head. 3.Acute CHF exac Likely secondary to volume overload from recent surgery. Cardiac enzymes negative x 3. 2 d echo with nl EF and NWMA. I/O = -3.2L. Continue strict I/O daily weights, diuretics, metoprolol.  Follow 4. Prob HCAP Sputum gram  Stain and culture pending.blood cx pending. Afebrile. WBC trending down.Will continue IV vancomycin and Zosyn and follow. 5. Pleural Effusions S/p thoracentesis. Results pending. Continue diuretics. 6. Hypokalemia Secondary to diuretics. Replete. 7. Anemia Likely postop. Check anemia panel. No gross overt GI bleed. Follow H/H 8. HTN Metoprolol 9.Prophylaxis Lovenox for DVT.  LOS: 1 day   [facility] 01/18/2012, 8:58 AM

## 2012-01-18 NOTE — Progress Notes (Addendum)
Brooke [name] 07-15-1932 [id number]  PCP: Cam Hai, CNM, CNM Outpatient Care Team: Patient Care Team: Cam Hai, CNM as PCP - General (Obstetrics and Gynecology) Eliezer Bottom., MD,FACG as Consulting Physician (Gastroenterology)  Inpatient Treatment Team: Treatment Team: Attending Provider: Ramiro Harvest, MD; Technician: Heloise Ochoa, EMT; Rounding Team: Massie Kluver, MD; Registered Nurse: [name], RN; Consulting Physician: Ardeth Sportsman, MD; Registered Nurse: Doylene Canard, RN  Subjective: Pt more confused last night.   Daughter at bedside - I had a long d/w her Apparently, pt had used no pain meds while at home.   No chest/abd pain  Objective:  Vital signs:  Temp:  [97.5 F (36.4 C)-99.4 F (37.4 C)] 97.5 F (36.4 C) (01/22 0800) Pulse Rate:  [40-115] 88  (01/22 0959) Resp:  [15-25] 16  (01/22 0959) BP: (104-138)/(38-88) 121/76 mmHg (01/22 0959) SpO2:  [85 %-98 %] 93 % (01/22 0959) Last BM Date:  (prior to surgery; not during this admission)  Intake/Output   Yesterday:  01/21 0701 - 01/22 0700 In: 609 [P.O.:360; IV Piggyback:249] Out: 2475 [Urine:2475] This shift:  Total I/O In: 39 [Other:10; IV Piggyback:29] Out: 400 [Urine:400]  Bowel function:  Flatus: Yes  BM: No  Physical Exam:  General: Pt resting in no acute distress.  Psych:  No psychosis/paranoia.  Occ confused HENT: Normocephalic, Mucus membranes moist.  No thrush Chest: Dec BS at bases No chest wall pain w good excursion Abdomen: Soft, Obese.  Nontender/Nondistended.  No incarcerated hernias.  Incisions c/d/i Ext:  SCDs BLE.  No mjr edema.  No cyanosis Skin: No petechiae / purpurae  Results:   Labs: Results for orders placed during the hospital encounter of 01/17/12 (from the past 48 hour(s))  PRO B NATRIURETIC PEPTIDE     Status: Abnormal   Collection Time   01/17/12  1:13 AM      Component Value Range Comment   Pro B Natriuretic peptide (BNP)  2552.0 (*) 0 - 450 (pg/mL)   CBC     Status: Abnormal   Collection Time   01/17/12  1:15 AM      Component Value Range Comment   WBC 17.7 (*) 4.0 - 10.5 (K/uL) REPEATED TO VERIFY   RBC 3.59 (*) 3.87 - 5.11 (MIL/uL)    Hemoglobin 9.2 (*) 12.0 - 15.0 (g/dL) REPEATED TO VERIFY   HCT 27.9 (*) 36.0 - 46.0 (%)    MCV 77.7 (*) 78.0 - 100.0 (fL)    MCH 25.6 (*) 26.0 - 34.0 (pg)    MCHC 33.0  30.0 - 36.0 (g/dL)    RDW 40.9 (*) 81.1 - 15.5 (%)    Platelets 279  150 - 400 (K/uL)   DIFFERENTIAL     Status: Abnormal   Collection Time   01/17/12  1:15 AM      Component Value Range Comment   Neutrophils Relative 87 (*) 43 - 77 (%)    Lymphocytes Relative 5 (*) 12 - 46 (%)    Monocytes Relative 8  3 - 12 (%)    Eosinophils Relative 0  0 - 5 (%)    Basophils Relative 0  0 - 1 (%)    Neutro Abs 15.4 (*) 1.7 - 7.7 (K/uL)    Lymphs Abs 0.9  0.7 - 4.0 (K/uL)    Monocytes Absolute 1.4 (*) 0.1 - 1.0 (K/uL)    Eosinophils Absolute 0.0  0.0 - 0.7 (K/uL)    Basophils Absolute 0.0  0.0 -  0.1 (K/uL)    RBC Morphology POLYCHROMASIA PRESENT      WBC Morphology TOXIC GRANULATION   WHITE COUNT CONFIRMED ON SMEAR  BASIC METABOLIC PANEL     Status: Abnormal   Collection Time   01/17/12  1:15 AM      Component Value Range Comment   Sodium 128 (*) 135 - 145 (mEq/L)    Potassium 2.9 (*) 3.5 - 5.1 (mEq/L)    Chloride 91 (*) 96 - 112 (mEq/L)    CO2 27  19 - 32 (mEq/L)    Glucose, Bld 139 (*) 70 - 99 (mg/dL)    BUN 10  6 - 23 (mg/dL)    Creatinine, Ser 4.09  0.50 - 1.10 (mg/dL)    Calcium 8.4  8.4 - 10.5 (mg/dL)    GFR calc non Af Amer 85 (*) >90 (mL/min)    GFR calc Af Amer >90  >90 (mL/min)   CARDIAC PANEL(CRET KIN+CKTOT+MB+TROPI)     Status: Abnormal   Collection Time   01/17/12  1:15 AM      Component Value Range Comment   Total CK 1026 (*) 7 - 177 (U/L)    CK, MB 4.9 (*) 0.3 - 4.0 (ng/mL)    Troponin I <0.30  <0.30 (ng/mL)    Relative Index 0.5  0.0 - 2.5    CULTURE, BLOOD (ROUTINE X 2)     Status:  Normal (Preliminary result)   Collection Time   01/17/12  2:00 AM      Component Value Range Comment   Specimen Description BLOOD RIGHT HAND      Special Requests BOTTLES DRAWN AEROBIC AND ANAEROBIC 5 CC EA      Setup Time [id number]      Culture        Value:        BLOOD CULTURE RECEIVED NO GROWTH TO DATE CULTURE WILL BE HELD FOR 5 DAYS BEFORE ISSUING A FINAL NEGATIVE REPORT   Report Status PENDING     CULTURE, BLOOD (ROUTINE X 2)     Status: Normal (Preliminary result)   Collection Time   01/17/12  2:20 AM      Component Value Range Comment   Specimen Description BLOOD RIGHT FOREARM      Special Requests BOTTLES DRAWN AEROBIC AND ANAEROBIC 5 CC EA      Setup Time [id number]      Culture        Value:        BLOOD CULTURE RECEIVED NO GROWTH TO DATE CULTURE WILL BE HELD FOR 5 DAYS BEFORE ISSUING A FINAL NEGATIVE REPORT   Report Status PENDING     LACTIC ACID, PLASMA     Status: Abnormal   Collection Time   01/17/12  3:32 AM      Component Value Range Comment   Lactic Acid, Venous 2.6 (*) 0.5 - 2.2 (mmol/L)   PROCALCITONIN     Status: Normal   Collection Time   01/17/12  3:50 AM      Component Value Range Comment   Procalcitonin 0.84     BLOOD GAS, ARTERIAL     Status: Abnormal   Collection Time   01/17/12  5:25 AM      Component Value Range Comment   FIO2 0.40      Delivery systems BILEVEL POSITIVE AIRWAY PRESSURE      Inspiratory PAP 10      Expiratory PAP 5      pH, Arterial 7.457 (*) 7.350 -  7.400     pCO2 arterial 39.9  35.0 - 45.0 (mmHg)    pO2, Arterial 71.8 (*) 80.0 - 100.0 (mmHg)    Bicarbonate 27.8 (*) 20.0 - 24.0 (mEq/L)    TCO2 25.7  0 - 100 (mmol/L)    Acid-Base Excess 4.0 (*) 0.0 - 2.0 (mmol/L)    O2 Saturation 95.1      Patient temperature 98.6      Collection site RIGHT RADIAL      Drawn by [id number]      Sample type ARTERIAL      Allens test (pass/fail) PASS  PASS    MRSA PCR SCREENING     Status: Normal   Collection Time   01/17/12  7:24 AM       Component Value Range Comment   MRSA by PCR NEGATIVE  NEGATIVE    OSMOLALITY, URINE     Status: Abnormal   Collection Time   01/17/12  7:31 AM      Component Value Range Comment   Osmolality, Ur 323 (*) 390 - 1090 (mOsm/kg)   SODIUM, URINE, RANDOM     Status: Normal   Collection Time   01/17/12  7:31 AM      Component Value Range Comment   Sodium, Ur 99     MAGNESIUM     Status: Normal   Collection Time   01/17/12  7:33 AM      Component Value Range Comment   Magnesium 1.9  1.5 - 2.5 (mg/dL)   OSMOLALITY     Status: Abnormal   Collection Time   01/17/12  7:33 AM      Component Value Range Comment   Osmolality 272 (*) 275 - 300 (mOsm/kg)   TSH     Status: Normal   Collection Time   01/17/12  7:33 AM      Component Value Range Comment   TSH 1.928  0.350 - 4.500 (uIU/mL)   LIPID PANEL     Status: Abnormal   Collection Time   01/17/12  7:33 AM      Component Value Range Comment   Cholesterol 97  0 - 200 (mg/dL)    Triglycerides 91  <914 (mg/dL)    HDL 35 (*) >78 (mg/dL)    Total CHOL/HDL Ratio 2.8      VLDL 18  0 - 40 (mg/dL)    LDL Cholesterol 44  0 - 99 (mg/dL)   LACTATE DEHYDROGENASE     Status: Abnormal   Collection Time   01/17/12  7:33 AM      Component Value Range Comment   LD 391 (*) 94 - 250 (U/L)   PROTEIN, TOTAL     Status: Normal   Collection Time   01/17/12  7:33 AM      Component Value Range Comment   Total Protein 6.4  6.0 - 8.3 (g/dL)   CBC     Status: Abnormal   Collection Time   01/17/12  7:33 AM      Component Value Range Comment   WBC 17.3 (*) 4.0 - 10.5 (K/uL)    RBC 3.57 (*) 3.87 - 5.11 (MIL/uL)    Hemoglobin 9.1 (*) 12.0 - 15.0 (g/dL)    HCT 29.5 (*) 62.1 - 46.0 (%)    MCV 78.2  78.0 - 100.0 (fL)    MCH 25.5 (*) 26.0 - 34.0 (pg)    MCHC 32.6  30.0 - 36.0 (g/dL)    RDW 30.8 (*) 65.7 - 15.5 (%)  Platelets 278  150 - 400 (K/uL)   CREATININE, SERUM     Status: Abnormal   Collection Time   01/17/12  7:33 AM      Component Value Range Comment    Creatinine, Ser 0.69  0.50 - 1.10 (mg/dL)    GFR calc non Af Amer 82 (*) >90 (mL/min)    GFR calc Af Amer >90  >90 (mL/min)   CARDIAC PANEL(CRET KIN+CKTOT+MB+TROPI)     Status: Abnormal   Collection Time   01/17/12  7:33 AM      Component Value Range Comment   Total CK 880 (*) 7 - 177 (U/L)    CK, MB 4.1 (*) 0.3 - 4.0 (ng/mL)    Troponin I <0.30  <0.30 (ng/mL)    Relative Index 0.5  0.0 - 2.5    LEGIONELLA ANTIGEN, URINE     Status: Normal   Collection Time   01/17/12  8:11 AM      Component Value Range Comment   Specimen Description URINE, CATHETERIZED      Special Requests NONE      Legionella Antigen, Urine Negative for Legionella pneumophilia serogroup 1      Report Status 01/18/2012 FINAL     STREP PNEUMONIAE URINARY ANTIGEN     Status: Normal   Collection Time   01/17/12  8:11 AM      Component Value Range Comment   Strep Pneumo Urinary Antigen NEGATIVE  NEGATIVE    BODY FLUID CELL COUNT WITH DIFFERENTIAL     Status: Abnormal   Collection Time   01/17/12  2:21 PM      Component Value Range Comment   Fluid Type-FCT THORACENTESIS   CORRECTED ON 01/21 AT 1431: PREVIOUSLY REPORTED AS Body Fluid,   Color, Fluid RED (*) YELLOW     Appearance, Fluid TURBID (*) CLEAR     WBC, Fluid 3829 (*) 0 - 1000 (cu mm)    Neutrophil Count, Fluid 81 (*) 0 - 25 (%)    Lymphs, Fluid 15      Monocyte-Macrophage-Serous Fluid 4 (*) 50 - 90 (%)   BODY FLUID CULTURE     Status: Normal (Preliminary result)   Collection Time   01/17/12  2:21 PM      Component Value Range Comment   Specimen Description THORACENTESIS      Special Requests NONE      Gram Stain        Value: FEW WBC PRESENT, PREDOMINANTLY MONONUCLEAR     NO ORGANISMS SEEN   Culture PENDING      Report Status PENDING     GLUCOSE, SEROUS FLUID     Status: Normal   Collection Time   01/17/12  2:21 PM      Component Value Range Comment   Glucose, Fluid 136      Fluid Type-FGLU THORACENTESIS   CORRECTED ON 01/21 AT 1431: PREVIOUSLY  REPORTED AS Body Fluid,  LACTATE DEHYDROGENASE, BODY FLUID     Status: Abnormal   Collection Time   01/17/12  2:21 PM      Component Value Range Comment   LD, Fluid 1619 (*) 3 - 23 (U/L)    Fluid Type-FLDH THORACENTESIS   CORRECTED ON 01/21 AT 1431: PREVIOUSLY REPORTED AS Body Fluid,  PROTEIN, BODY FLUID     Status: Normal   Collection Time   01/17/12  2:21 PM      Component Value Range Comment   Total protein, fluid 3.6   NO NORMAL  RANGE ESTABLISHED FOR THIS TEST   Fluid Type-FTP THORACENTESIS   CORRECTED ON 01/21 AT 1431: PREVIOUSLY REPORTED AS Body Fluid,  PATHOLOGIST SMEAR REVIEW     Status: Normal   Collection Time   01/17/12  2:21 PM      Component Value Range Comment   Tech Review Reviewed By Havery Moros, M.D.     BASIC METABOLIC PANEL     Status: Abnormal   Collection Time   01/17/12  3:48 PM      Component Value Range Comment   Sodium 133 (*) 135 - 145 (mEq/L)    Potassium 3.2 (*) 3.5 - 5.1 (mEq/L)    Chloride 90 (*) 96 - 112 (mEq/L)    CO2 31  19 - 32 (mEq/L)    Glucose, Bld 130 (*) 70 - 99 (mg/dL)    BUN 6  6 - 23 (mg/dL)    Creatinine, Ser 1.61  0.50 - 1.10 (mg/dL)    Calcium 7.7 (*) 8.4 - 10.5 (mg/dL)    GFR calc non Af Amer 80 (*) >90 (mL/min)    GFR calc Af Amer >90  >90 (mL/min)   CARDIAC PANEL(CRET KIN+CKTOT+MB+TROPI)     Status: Abnormal   Collection Time   01/17/12  3:48 PM      Component Value Range Comment   Total CK 783 (*) 7 - 177 (U/L)    CK, MB 2.8  0.3 - 4.0 (ng/mL)    Troponin I <0.30  <0.30 (ng/mL)    Relative Index 0.4  0.0 - 2.5    PRO B NATRIURETIC PEPTIDE     Status: Abnormal   Collection Time   01/18/12  3:01 AM      Component Value Range Comment   Pro B Natriuretic peptide (BNP) 1181.0 (*) 0 - 450 (pg/mL)   BASIC METABOLIC PANEL     Status: Abnormal   Collection Time   01/18/12  3:01 AM      Component Value Range Comment   Sodium 133 (*) 135 - 145 (mEq/L)    Potassium 3.1 (*) 3.5 - 5.1 (mEq/L)    Chloride 93 (*) 96 - 112 (mEq/L)    CO2  30  19 - 32 (mEq/L)    Glucose, Bld 113 (*) 70 - 99 (mg/dL)    BUN 9  6 - 23 (mg/dL)    Creatinine, Ser 0.96  0.50 - 1.10 (mg/dL)    Calcium 7.6 (*) 8.4 - 10.5 (mg/dL)    GFR calc non Af Amer 78 (*) >90 (mL/min)    GFR calc Af Amer >90  >90 (mL/min)   CBC     Status: Abnormal   Collection Time   01/18/12  3:01 AM      Component Value Range Comment   WBC 15.7 (*) 4.0 - 10.5 (K/uL)    RBC 3.63 (*) 3.87 - 5.11 (MIL/uL)    Hemoglobin 8.9 (*) 12.0 - 15.0 (g/dL)    HCT 04.5 (*) 40.9 - 46.0 (%)    MCV 78.0  78.0 - 100.0 (fL)    MCH 24.5 (*) 26.0 - 34.0 (pg)    MCHC 31.4  30.0 - 36.0 (g/dL)    RDW 81.1 (*) 91.4 - 15.5 (%)    Platelets 272  150 - 400 (K/uL)   DIFFERENTIAL     Status: Abnormal   Collection Time   01/18/12  3:01 AM      Component Value Range Comment   Neutrophils Relative 82 (*) 43 -  77 (%)    Neutro Abs 12.9 (*) 1.7 - 7.7 (K/uL)    Lymphocytes Relative 8 (*) 12 - 46 (%)    Lymphs Abs 1.3  0.7 - 4.0 (K/uL)    Monocytes Relative 9  3 - 12 (%)    Monocytes Absolute 1.5 (*) 0.1 - 1.0 (K/uL)    Eosinophils Relative 0  0 - 5 (%)    Eosinophils Absolute 0.0  0.0 - 0.7 (K/uL)    Basophils Relative 0  0 - 1 (%)    Basophils Absolute 0.0  0.0 - 0.1 (K/uL)   MAGNESIUM     Status: Normal   Collection Time   01/18/12  3:01 AM      Component Value Range Comment   Magnesium 2.0  1.5 - 2.5 (mg/dL)     Imaging / Studies: Ct Head Wo Contrast  01/18/2012  *RADIOLOGY REPORT*  Clinical Data: Confusion.  Agitation.  CT HEAD WITHOUT CONTRAST  Technique:  Contiguous axial images were obtained from the base of the skull through the vertex without contrast.  Comparison: None.  Findings: No intracranial hemorrhage.  Small vessel disease type changes. No CT evidence of large acute infarct.  Small acute infarct cannot be excluded by CT.  Intracranial vascular calcifications.  Mild global atrophy without hydrocephalus.  No intracranial mass lesion detected on this unenhanced exam.  Visualized  sinuses and mastoid air cells are clear.  IMPRESSION: No intracranial hemorrhage or CT evidence of large acute infarct.  Small vessel disease type changes.  Mild atrophy.  Original Report Authenticated By: Fuller Canada, M.D.   Ct Angio Chest W/cm &/or Wo Cm  01/17/2012  *RADIOLOGY REPORT*  Clinical Data: Shortness of breath and cough; leukocytosis.  CT ANGIOGRAPHY CHEST  Technique:  Multidetector CT imaging of the chest using the standard protocol during bolus administration of intravenous contrast. Multiplanar reconstructed images including MIPs were obtained and reviewed to evaluate the vascular anatomy.  Contrast: OMNIPAQUE IOHEXOL 300 MG/ML IV SOLN  Comparison: Chest radiograph performed earlier today at 01:15 a.m.  Findings: There is no evidence of central pulmonary embolus. Evaluation for pulmonary embolus is suboptimal in areas of airspace consolidation.  Moderate bilateral pleural effusions are noted, with associated partial consolidation of both lower lung lobes.  Underlying pneumonia cannot be excluded.  Mild interstitial prominence could reflect mild interstitial edema.  There is no evidence of pneumothorax.  No masses are identified; no abnormal focal contrast enhancement is seen.  An apparent pericardial cyst is noted, measuring 5.0 cm in size. The mediastinum status is otherwise unremarkable in appearance.  No suspicious mediastinal lymphadenopathy is seen.  No pericardial effusion is identified.  The great vessels are unremarkable in appearance.  No axillary lymphadenopathy is seen.  The thyroid gland is unremarkable in appearance.  The visualized portions of the liver and spleen are unremarkable.  No acute osseous abnormalities are seen.  IMPRESSION:  1.  No evidence of central pulmonary embolus. 2.  Moderate bilateral pleural effusions; mild interstitial prominence could reflect mild interstitial edema.  Associated partial consolidation of both lower lung lobes most likely reflects  atelectasis, though pneumonia cannot be entirely excluded. 3.  Flattened 5.0 cm pericardial cyst noted.  Original Report Authenticated By: Tonia Ghent, M.D.   Dg Chest Port 1 View  01/18/2012  *RADIOLOGY REPORT*  Clinical Data: Congestive heart failure, follow-up  PORTABLE CHEST - 1 VIEW  Comparison: Portable chest x-ray of 01/17/2012  Findings: There is little change in aeration with basilar  atelectasis and probable effusions left greater than right. Cardiomegaly is stable.  No bony abnormality is seen.  IMPRESSION: No significant change in suboptimal aeration with basilar atelectasis and probable effusions left greater than left.  Original Report Authenticated By: Juline Patch, M.D.   Dg Chest Port 1 View  01/17/2012  *RADIOLOGY REPORT*  Clinical Data: Status post right thoracentesis.  Evaluate for pneumothorax.  PORTABLE CHEST - 1 VIEW  Comparison: 01/17/2012  Findings: There are bilateral pleural effusions left greater than right.  When compared with the previous exam there is been decrease in volume of the right effusion.  No pneumothorax is identified.  There is atelectasis identified in both lung bases.  IMPRESSION:  1.  Decrease in volume of right pleural effusion. 2.  No pneumothorax identified.  Original Report Authenticated By: Rosealee Albee, M.D.   Dg Chest Portable 1 View  01/17/2012  *RADIOLOGY REPORT*  Clinical Data: Shortness of breath.  PORTABLE CHEST - 1 VIEW  Comparison: None.  Findings: The lungs are hypoexpanded; retrocardiac airspace opacification raises question for pneumonia.  Underlying vascular crowding and mild vascular congestion are noted; the appearance is less typical for edema.  No pneumothorax is seen; a small left pleural effusion is suspected.  The cardiomediastinal silhouette is borderline normal in size; calcification is noted within the aortic arch.  No acute osseous abnormalities are seen.  IMPRESSION: Lungs hypoexpanded; retrocardiac airspace opacification  raises concern for pneumonia.  The appearance is less typical for edema; suspect small left pleural effusion.  Original Report Authenticated By: Tonia Ghent, M.D.   US Thoracentesis Asp Pleural Space W/img Guide  01/17/2012  *RADIOLOGY REPORT*  Clinical Data: Bilateral pleural effusions of uncertain etiology. Request has been made for therapeutic and diagnostic thoracentesis  ULTRASOUND GUIDED right THORACENTESIS  Comparison:  Prior chest x-rays and ultrasound performed today revealed the right effusion was larger than the left.  Due to the patient's confusion, written consent was obtained from the patient's son in regards to the thoracentesis procedure.  Ultrasound was performed to localize and mark an adequate pocket of fluid in the right chest.  The area was then prepped and draped in the normal sterile fashion.  1% Lidocaine was used for local anesthesia.  Under ultrasound guidance a 19 gauge Yueh catheter was introduced.  Thoracentesis was performed.  The catheter was removed and a dressing applied.  Complications:  None immediate  Findings: A total of approximately 630 ml of bloody serous fluid was removed. A fluid sample was sent for laboratory analysis.  IMPRESSION: Successful ultrasound guided right thoracentesis yielding 630 ml of pleural fluid.  Read by: Anselm Pancoast, P.A.-C  Original Report Authenticated By: Reola Calkins, M.D.    Medications / Allergies: per chart  Antibiotics: Anti-infectives     Start     Dose/Rate Route Frequency Ordered Stop   01/17/12 1700   vancomycin (VANCOCIN) 500 mg in sodium chloride 0.9 % 100 mL IVPB        500 mg 100 mL/hr over 60 Minutes Intravenous Every 12 hours 01/17/12 0651     01/17/12 1300  piperacillin-tazobactam (ZOSYN) IVPB 3.375 g       3.375 g 12.5 mL/hr over 240 Minutes Intravenous Every 8 hours 01/17/12 0651     01/17/12 0415   vancomycin (VANCOCIN) injection 1 g  Status:  Discontinued        1 g Intravenous  Once 01/17/12 0350  01/17/12 0401   01/17/12 0415  piperacillin-tazobactam (ZOSYN) IVPB 4.5  g       4.5 g 200 mL/hr over 30 Minutes Intravenous  Once 01/17/12 0350 01/17/12 0452   01/17/12 0415   vancomycin (VANCOCIN) IVPB 1000 mg/200 mL premix        1,000 mg 200 mL/hr over 60 Minutes Intravenous To Emergency Dept 01/17/12 0401 01/17/12 0604   01/17/12 0145   azithromycin (ZITHROMAX) 500 mg in dextrose 5 % 250 mL IVPB        500 mg 250 mL/hr over 60 Minutes Intravenous  Once 01/17/12 0141 01/17/12 0410   01/17/12 0145   cefTRIAXone (ROCEPHIN) 1 g in dextrose 5 % 50 mL IVPB        1 g 100 mL/hr over 30 Minutes Intravenous  Once 01/17/12 0141 01/17/12 0243          Assessment  Arletta Bale  78 y.o. female       Problem List:  Principal Problem:  *Pleural effusion, bilateral Active Problems:  ANEMIA, IRON DEFICIENCY  HYPERTENSION  ARTERIOVENOUS MALFORMATION  Praesophageal hiatal hernia - giant, irreducible  Acute respiratory failure  CHF (congestive heart failure)  Hypokalemia  Confusion  Bilateral effusion - tapped Mild/mod confusion - CT negative  Plan: -sundowning precautions - but let her rest today -ICU per IM tean -pulmonary toilet Retry liquids, adv to pureed as tolerated.  NO solids since just had fundopliaction.  UGI if c/o chest pain/abd pain but no ev of leak/abscess -crush meds -bowel regimen -VTE prophylaxis- SCDs, etc -mobilize as tolerated to help recovery  Ardeth Sportsman, M.D., F.A.C.S. [facility] [address], [address] [place], [place] [address] [phone] Main / Paging [phone] Voice Mail   01/18/2012

## 2012-01-18 NOTE — Progress Notes (Signed)
Brooke [name] [birth year] [id number]  PCP: Cam Hai, CNM, CNM Outpatient Care Team: Patient Care Team: Cam Hai, CNM as PCP - General (Obstetrics and Gynecology) Eliezer Bottom., MD,FACG as Consulting Physician (Gastroenterology)  Inpatient Treatment Team: Treatment Team: Attending Provider: Ramiro Harvest, MD; Technician: Heloise Ochoa, EMT; Rounding Team: Massie Kluver, MD; Registered Nurse: [name], RN; Consulting Physician: Ardeth Sportsman, MD; Registered Nurse: Doylene Canard, RN  Subjective: Pt occ confused of location but at best AAOx4 No chest/abd pain  Objective:  Vital signs:  Temp:  [97.5 F (36.4 C)-99.4 F (37.4 C)] 97.5 F (36.4 C) (01/22 0800) Pulse Rate:  [40-115] 88  (01/22 0959) Resp:  [15-25] 16  (01/22 0959) BP: (104-138)/(38-88) 121/76 mmHg (01/22 0959) SpO2:  [85 %-98 %] 93 % (01/22 0959) Last BM Date:  (prior to surgery; not during this admission)  Intake/Output   Yesterday:  01/21 0701 - 01/22 0700 In: 609 [P.O.:360; IV Piggyback:249] Out: 2475 [Urine:2475] This shift:  Total I/O In: 39 [Other:10; IV Piggyback:29] Out: 400 [Urine:400]  Bowel function:  Flatus: Yes  BM: No  Physical Exam:  General: Pt awake/alert/oriented x4 in no acute distress.  Gets sleepy Eyes: PERRL, normal EOM.  Sclera clear.  No icterus Neuro: CN II-XII intact w/o focal sensory/motor deficits. Lymph: No head/neck/groin lymphadenopathy Psych:  No psychosis/paranoia.  Occ confused HENT: Normocephalic, Mucus membranes moist.  No thrush Neck: Supple, No tracheal deviation Chest: Dec BS at bases No chest wall pain w good excursion CV:  Pulses intact.  Regular rhythm Abdomen: Soft, Nontender/Nondistended.  No incarcerated hernias.  Incisions c/d/i Ext:  SCDs BLE.  No mjr edema.  No cyanosis Skin: No petechiae / purpurae  Results:   Labs: Results for orders placed during the hospital encounter of 01/17/12 (from the past 48 hour(s))   PRO B NATRIURETIC PEPTIDE     Status: Abnormal   Collection Time   01/17/12  1:13 AM      Component Value Range Comment   Pro B Natriuretic peptide (BNP) 2552.0 (*) 0 - 450 (pg/mL)   CBC     Status: Abnormal   Collection Time   01/17/12  1:15 AM      Component Value Range Comment   WBC 17.7 (*) 4.0 - 10.5 (K/uL) REPEATED TO VERIFY   RBC 3.59 (*) 3.87 - 5.11 (MIL/uL)    Hemoglobin 9.2 (*) 12.0 - 15.0 (g/dL) REPEATED TO VERIFY   HCT 27.9 (*) 36.0 - 46.0 (%)    MCV 77.7 (*) 78.0 - 100.0 (fL)    MCH 25.6 (*) 26.0 - 34.0 (pg)    MCHC 33.0  30.0 - 36.0 (g/dL)    RDW 40.9 (*) 81.1 - 15.5 (%)    Platelets 279  150 - 400 (K/uL)   DIFFERENTIAL     Status: Abnormal   Collection Time   01/17/12  1:15 AM      Component Value Range Comment   Neutrophils Relative 87 (*) 43 - 77 (%)    Lymphocytes Relative 5 (*) 12 - 46 (%)    Monocytes Relative 8  3 - 12 (%)    Eosinophils Relative 0  0 - 5 (%)    Basophils Relative 0  0 - 1 (%)    Neutro Abs 15.4 (*) 1.7 - 7.7 (K/uL)    Lymphs Abs 0.9  0.7 - 4.0 (K/uL)    Monocytes Absolute 1.4 (*) 0.1 - 1.0 (K/uL)    Eosinophils Absolute  0.0  0.0 - 0.7 (K/uL)    Basophils Absolute 0.0  0.0 - 0.1 (K/uL)    RBC Morphology POLYCHROMASIA PRESENT      WBC Morphology TOXIC GRANULATION   WHITE COUNT CONFIRMED ON SMEAR  BASIC METABOLIC PANEL     Status: Abnormal   Collection Time   01/17/12  1:15 AM      Component Value Range Comment   Sodium 128 (*) 135 - 145 (mEq/L)    Potassium 2.9 (*) 3.5 - 5.1 (mEq/L)    Chloride 91 (*) 96 - 112 (mEq/L)    CO2 27  19 - 32 (mEq/L)    Glucose, Bld 139 (*) 70 - 99 (mg/dL)    BUN 10  6 - 23 (mg/dL)    Creatinine, Ser 3.08  0.50 - 1.10 (mg/dL)    Calcium 8.4  8.4 - 10.5 (mg/dL)    GFR calc non Af Amer 85 (*) >90 (mL/min)    GFR calc Af Amer >90  >90 (mL/min)   CARDIAC PANEL(CRET KIN+CKTOT+MB+TROPI)     Status: Abnormal   Collection Time   01/17/12  1:15 AM      Component Value Range Comment   Total CK 1026 (*) 7 - 177  (U/L)    CK, MB 4.9 (*) 0.3 - 4.0 (ng/mL)    Troponin I <0.30  <0.30 (ng/mL)    Relative Index 0.5  0.0 - 2.5    CULTURE, BLOOD (ROUTINE X 2)     Status: Normal (Preliminary result)   Collection Time   01/17/12  2:00 AM      Component Value Range Comment   Specimen Description BLOOD RIGHT HAND      Special Requests BOTTLES DRAWN AEROBIC AND ANAEROBIC 5 CC EA      Setup Time [id number]      Culture        Value:        BLOOD CULTURE RECEIVED NO GROWTH TO DATE CULTURE WILL BE HELD FOR 5 DAYS BEFORE ISSUING A FINAL NEGATIVE REPORT   Report Status PENDING     CULTURE, BLOOD (ROUTINE X 2)     Status: Normal (Preliminary result)   Collection Time   01/17/12  2:20 AM      Component Value Range Comment   Specimen Description BLOOD RIGHT FOREARM      Special Requests BOTTLES DRAWN AEROBIC AND ANAEROBIC 5 CC EA      Setup Time [id number]      Culture        Value:        BLOOD CULTURE RECEIVED NO GROWTH TO DATE CULTURE WILL BE HELD FOR 5 DAYS BEFORE ISSUING A FINAL NEGATIVE REPORT   Report Status PENDING     LACTIC ACID, PLASMA     Status: Abnormal   Collection Time   01/17/12  3:32 AM      Component Value Range Comment   Lactic Acid, Venous 2.6 (*) 0.5 - 2.2 (mmol/L)   PROCALCITONIN     Status: Normal   Collection Time   01/17/12  3:50 AM      Component Value Range Comment   Procalcitonin 0.84     BLOOD GAS, ARTERIAL     Status: Abnormal   Collection Time   01/17/12  5:25 AM      Component Value Range Comment   FIO2 0.40      Delivery systems BILEVEL POSITIVE AIRWAY PRESSURE      Inspiratory PAP 10  Expiratory PAP 5      pH, Arterial 7.457 (*) 7.350 - 7.400     pCO2 arterial 39.9  35.0 - 45.0 (mmHg)    pO2, Arterial 71.8 (*) 80.0 - 100.0 (mmHg)    Bicarbonate 27.8 (*) 20.0 - 24.0 (mEq/L)    TCO2 25.7  0 - 100 (mmol/L)    Acid-Base Excess 4.0 (*) 0.0 - 2.0 (mmol/L)    O2 Saturation 95.1      Patient temperature 98.6      Collection site RIGHT RADIAL      Drawn by [id number]       Sample type ARTERIAL      Allens test (pass/fail) PASS  PASS    MRSA PCR SCREENING     Status: Normal   Collection Time   01/17/12  7:24 AM      Component Value Range Comment   MRSA by PCR NEGATIVE  NEGATIVE    OSMOLALITY, URINE     Status: Abnormal   Collection Time   01/17/12  7:31 AM      Component Value Range Comment   Osmolality, Ur 323 (*) 390 - 1090 (mOsm/kg)   SODIUM, URINE, RANDOM     Status: Normal   Collection Time   01/17/12  7:31 AM      Component Value Range Comment   Sodium, Ur 99     MAGNESIUM     Status: Normal   Collection Time   01/17/12  7:33 AM      Component Value Range Comment   Magnesium 1.9  1.5 - 2.5 (mg/dL)   OSMOLALITY     Status: Abnormal   Collection Time   01/17/12  7:33 AM      Component Value Range Comment   Osmolality 272 (*) 275 - 300 (mOsm/kg)   TSH     Status: Normal   Collection Time   01/17/12  7:33 AM      Component Value Range Comment   TSH 1.928  0.350 - 4.500 (uIU/mL)   LIPID PANEL     Status: Abnormal   Collection Time   01/17/12  7:33 AM      Component Value Range Comment   Cholesterol 97  0 - 200 (mg/dL)    Triglycerides 91  <045 (mg/dL)    HDL 35 (*) >40 (mg/dL)    Total CHOL/HDL Ratio 2.8      VLDL 18  0 - 40 (mg/dL)    LDL Cholesterol 44  0 - 99 (mg/dL)   LACTATE DEHYDROGENASE     Status: Abnormal   Collection Time   01/17/12  7:33 AM      Component Value Range Comment   LD 391 (*) 94 - 250 (U/L)   PROTEIN, TOTAL     Status: Normal   Collection Time   01/17/12  7:33 AM      Component Value Range Comment   Total Protein 6.4  6.0 - 8.3 (g/dL)   CBC     Status: Abnormal   Collection Time   01/17/12  7:33 AM      Component Value Range Comment   WBC 17.3 (*) 4.0 - 10.5 (K/uL)    RBC 3.57 (*) 3.87 - 5.11 (MIL/uL)    Hemoglobin 9.1 (*) 12.0 - 15.0 (g/dL)    HCT 98.1 (*) 19.1 - 46.0 (%)    MCV 78.2  78.0 - 100.0 (fL)    MCH 25.5 (*) 26.0 - 34.0 (pg)    MCHC 32.6  30.0 - 36.0 (g/dL)    RDW 95.6 (*) 21.3 - 15.5 (%)     Platelets 278  150 - 400 (K/uL)   CREATININE, SERUM     Status: Abnormal   Collection Time   01/17/12  7:33 AM      Component Value Range Comment   Creatinine, Ser 0.69  0.50 - 1.10 (mg/dL)    GFR calc non Af Amer 82 (*) >90 (mL/min)    GFR calc Af Amer >90  >90 (mL/min)   CARDIAC PANEL(CRET KIN+CKTOT+MB+TROPI)     Status: Abnormal   Collection Time   01/17/12  7:33 AM      Component Value Range Comment   Total CK 880 (*) 7 - 177 (U/L)    CK, MB 4.1 (*) 0.3 - 4.0 (ng/mL)    Troponin I <0.30  <0.30 (ng/mL)    Relative Index 0.5  0.0 - 2.5    LEGIONELLA ANTIGEN, URINE     Status: Normal   Collection Time   01/17/12  8:11 AM      Component Value Range Comment   Specimen Description URINE, CATHETERIZED      Special Requests NONE      Legionella Antigen, Urine Negative for Legionella pneumophilia serogroup 1      Report Status 01/18/2012 FINAL     STREP PNEUMONIAE URINARY ANTIGEN     Status: Normal   Collection Time   01/17/12  8:11 AM      Component Value Range Comment   Strep Pneumo Urinary Antigen NEGATIVE  NEGATIVE    BODY FLUID CELL COUNT WITH DIFFERENTIAL     Status: Abnormal   Collection Time   01/17/12  2:21 PM      Component Value Range Comment   Fluid Type-FCT THORACENTESIS   CORRECTED ON 01/21 AT 1431: PREVIOUSLY REPORTED AS Body Fluid,   Color, Fluid RED (*) YELLOW     Appearance, Fluid TURBID (*) CLEAR     WBC, Fluid 3829 (*) 0 - 1000 (cu mm)    Neutrophil Count, Fluid 81 (*) 0 - 25 (%)    Lymphs, Fluid 15      Monocyte-Macrophage-Serous Fluid 4 (*) 50 - 90 (%)   BODY FLUID CULTURE     Status: Normal (Preliminary result)   Collection Time   01/17/12  2:21 PM      Component Value Range Comment   Specimen Description THORACENTESIS      Special Requests NONE      Gram Stain        Value: FEW WBC PRESENT, PREDOMINANTLY MONONUCLEAR     NO ORGANISMS SEEN   Culture PENDING      Report Status PENDING     GLUCOSE, SEROUS FLUID     Status: Normal   Collection Time    01/17/12  2:21 PM      Component Value Range Comment   Glucose, Fluid 136      Fluid Type-FGLU THORACENTESIS   CORRECTED ON 01/21 AT 1431: PREVIOUSLY REPORTED AS Body Fluid,  LACTATE DEHYDROGENASE, BODY FLUID     Status: Abnormal   Collection Time   01/17/12  2:21 PM      Component Value Range Comment   LD, Fluid 1619 (*) 3 - 23 (U/L)    Fluid Type-FLDH THORACENTESIS   CORRECTED ON 01/21 AT 1431: PREVIOUSLY REPORTED AS Body Fluid,  PROTEIN, BODY FLUID     Status: Normal   Collection Time   01/17/12  2:21 PM  Component Value Range Comment   Total protein, fluid 3.6   NO NORMAL RANGE ESTABLISHED FOR THIS TEST   Fluid Type-FTP THORACENTESIS   CORRECTED ON 01/21 AT 1431: PREVIOUSLY REPORTED AS Body Fluid,  PATHOLOGIST SMEAR REVIEW     Status: Normal   Collection Time   01/17/12  2:21 PM      Component Value Range Comment   Tech Review Reviewed By Havery Moros, M.D.     BASIC METABOLIC PANEL     Status: Abnormal   Collection Time   01/17/12  3:48 PM      Component Value Range Comment   Sodium 133 (*) 135 - 145 (mEq/L)    Potassium 3.2 (*) 3.5 - 5.1 (mEq/L)    Chloride 90 (*) 96 - 112 (mEq/L)    CO2 31  19 - 32 (mEq/L)    Glucose, Bld 130 (*) 70 - 99 (mg/dL)    BUN 6  6 - 23 (mg/dL)    Creatinine, Ser 6.57  0.50 - 1.10 (mg/dL)    Calcium 7.7 (*) 8.4 - 10.5 (mg/dL)    GFR calc non Af Amer 80 (*) >90 (mL/min)    GFR calc Af Amer >90  >90 (mL/min)   CARDIAC PANEL(CRET KIN+CKTOT+MB+TROPI)     Status: Abnormal   Collection Time   01/17/12  3:48 PM      Component Value Range Comment   Total CK 783 (*) 7 - 177 (U/L)    CK, MB 2.8  0.3 - 4.0 (ng/mL)    Troponin I <0.30  <0.30 (ng/mL)    Relative Index 0.4  0.0 - 2.5    PRO B NATRIURETIC PEPTIDE     Status: Abnormal   Collection Time   01/18/12  3:01 AM      Component Value Range Comment   Pro B Natriuretic peptide (BNP) 1181.0 (*) 0 - 450 (pg/mL)   BASIC METABOLIC PANEL     Status: Abnormal   Collection Time   01/18/12  3:01 AM       Component Value Range Comment   Sodium 133 (*) 135 - 145 (mEq/L)    Potassium 3.1 (*) 3.5 - 5.1 (mEq/L)    Chloride 93 (*) 96 - 112 (mEq/L)    CO2 30  19 - 32 (mEq/L)    Glucose, Bld 113 (*) 70 - 99 (mg/dL)    BUN 9  6 - 23 (mg/dL)    Creatinine, Ser 8.46  0.50 - 1.10 (mg/dL)    Calcium 7.6 (*) 8.4 - 10.5 (mg/dL)    GFR calc non Af Amer 78 (*) >90 (mL/min)    GFR calc Af Amer >90  >90 (mL/min)   CBC     Status: Abnormal   Collection Time   01/18/12  3:01 AM      Component Value Range Comment   WBC 15.7 (*) 4.0 - 10.5 (K/uL)    RBC 3.63 (*) 3.87 - 5.11 (MIL/uL)    Hemoglobin 8.9 (*) 12.0 - 15.0 (g/dL)    HCT 96.2 (*) 95.2 - 46.0 (%)    MCV 78.0  78.0 - 100.0 (fL)    MCH 24.5 (*) 26.0 - 34.0 (pg)    MCHC 31.4  30.0 - 36.0 (g/dL)    RDW 84.1 (*) 32.4 - 15.5 (%)    Platelets 272  150 - 400 (K/uL)   DIFFERENTIAL     Status: Abnormal   Collection Time   01/18/12  3:01 AM  Component Value Range Comment   Neutrophils Relative 82 (*) 43 - 77 (%)    Neutro Abs 12.9 (*) 1.7 - 7.7 (K/uL)    Lymphocytes Relative 8 (*) 12 - 46 (%)    Lymphs Abs 1.3  0.7 - 4.0 (K/uL)    Monocytes Relative 9  3 - 12 (%)    Monocytes Absolute 1.5 (*) 0.1 - 1.0 (K/uL)    Eosinophils Relative 0  0 - 5 (%)    Eosinophils Absolute 0.0  0.0 - 0.7 (K/uL)    Basophils Relative 0  0 - 1 (%)    Basophils Absolute 0.0  0.0 - 0.1 (K/uL)   MAGNESIUM     Status: Normal   Collection Time   01/18/12  3:01 AM      Component Value Range Comment   Magnesium 2.0  1.5 - 2.5 (mg/dL)     Imaging / Studies: Ct Head Wo Contrast  01/18/2012  *RADIOLOGY REPORT*  Clinical Data: Confusion.  Agitation.  CT HEAD WITHOUT CONTRAST  Technique:  Contiguous axial images were obtained from the base of the skull through the vertex without contrast.  Comparison: None.  Findings: No intracranial hemorrhage.  Small vessel disease type changes. No CT evidence of large acute infarct.  Small acute infarct cannot be excluded by CT.   Intracranial vascular calcifications.  Mild global atrophy without hydrocephalus.  No intracranial mass lesion detected on this unenhanced exam.  Visualized sinuses and mastoid air cells are clear.  IMPRESSION: No intracranial hemorrhage or CT evidence of large acute infarct.  Small vessel disease type changes.  Mild atrophy.  Original Report Authenticated By: Fuller Canada, M.D.   Ct Angio Chest W/cm &/or Wo Cm  01/17/2012  *RADIOLOGY REPORT*  Clinical Data: Shortness of breath and cough; leukocytosis.  CT ANGIOGRAPHY CHEST  Technique:  Multidetector CT imaging of the chest using the standard protocol during bolus administration of intravenous contrast. Multiplanar reconstructed images including MIPs were obtained and reviewed to evaluate the vascular anatomy.  Contrast: OMNIPAQUE IOHEXOL 300 MG/ML IV SOLN  Comparison: Chest radiograph performed earlier today at 01:15 a.m.  Findings: There is no evidence of central pulmonary embolus. Evaluation for pulmonary embolus is suboptimal in areas of airspace consolidation.  Moderate bilateral pleural effusions are noted, with associated partial consolidation of both lower lung lobes.  Underlying pneumonia cannot be excluded.  Mild interstitial prominence could reflect mild interstitial edema.  There is no evidence of pneumothorax.  No masses are identified; no abnormal focal contrast enhancement is seen.  An apparent pericardial cyst is noted, measuring 5.0 cm in size. The mediastinum status is otherwise unremarkable in appearance.  No suspicious mediastinal lymphadenopathy is seen.  No pericardial effusion is identified.  The great vessels are unremarkable in appearance.  No axillary lymphadenopathy is seen.  The thyroid gland is unremarkable in appearance.  The visualized portions of the liver and spleen are unremarkable.  No acute osseous abnormalities are seen.  IMPRESSION:  1.  No evidence of central pulmonary embolus. 2.  Moderate bilateral pleural  effusions; mild interstitial prominence could reflect mild interstitial edema.  Associated partial consolidation of both lower lung lobes most likely reflects atelectasis, though pneumonia cannot be entirely excluded. 3.  Flattened 5.0 cm pericardial cyst noted.  Original Report Authenticated By: Tonia Ghent, M.D.   Dg Chest Port 1 View  01/18/2012  *RADIOLOGY REPORT*  Clinical Data: Congestive heart failure, follow-up  PORTABLE CHEST - 1 VIEW  Comparison: Portable chest x-ray  of 01/17/2012  Findings: There is little change in aeration with basilar atelectasis and probable effusions left greater than right. Cardiomegaly is stable.  No bony abnormality is seen.  IMPRESSION: No significant change in suboptimal aeration with basilar atelectasis and probable effusions left greater than left.  Original Report Authenticated By: Juline Patch, M.D.   Dg Chest Port 1 View  01/17/2012  *RADIOLOGY REPORT*  Clinical Data: Status post right thoracentesis.  Evaluate for pneumothorax.  PORTABLE CHEST - 1 VIEW  Comparison: 01/17/2012  Findings: There are bilateral pleural effusions left greater than right.  When compared with the previous exam there is been decrease in volume of the right effusion.  No pneumothorax is identified.  There is atelectasis identified in both lung bases.  IMPRESSION:  1.  Decrease in volume of right pleural effusion. 2.  No pneumothorax identified.  Original Report Authenticated By: Rosealee Albee, M.D.   Dg Chest Portable 1 View  01/17/2012  *RADIOLOGY REPORT*  Clinical Data: Shortness of breath.  PORTABLE CHEST - 1 VIEW  Comparison: None.  Findings: The lungs are hypoexpanded; retrocardiac airspace opacification raises question for pneumonia.  Underlying vascular crowding and mild vascular congestion are noted; the appearance is less typical for edema.  No pneumothorax is seen; a small left pleural effusion is suspected.  The cardiomediastinal silhouette is borderline normal in size;  calcification is noted within the aortic arch.  No acute osseous abnormalities are seen.  IMPRESSION: Lungs hypoexpanded; retrocardiac airspace opacification raises concern for pneumonia.  The appearance is less typical for edema; suspect small left pleural effusion.  Original Report Authenticated By: Tonia Ghent, M.D.   US Thoracentesis Asp Pleural Space W/img Guide  01/17/2012  *RADIOLOGY REPORT*  Clinical Data: Bilateral pleural effusions of uncertain etiology. Request has been made for therapeutic and diagnostic thoracentesis  ULTRASOUND GUIDED right THORACENTESIS  Comparison:  Prior chest x-rays and ultrasound performed today revealed the right effusion was larger than the left.  Due to the patient's confusion, written consent was obtained from the patient's son in regards to the thoracentesis procedure.  Ultrasound was performed to localize and mark an adequate pocket of fluid in the right chest.  The area was then prepped and draped in the normal sterile fashion.  1% Lidocaine was used for local anesthesia.  Under ultrasound guidance a 19 gauge Yueh catheter was introduced.  Thoracentesis was performed.  The catheter was removed and a dressing applied.  Complications:  None immediate  Findings: A total of approximately 630 ml of bloody serous fluid was removed. A fluid sample was sent for laboratory analysis.  IMPRESSION: Successful ultrasound guided right thoracentesis yielding 630 ml of pleural fluid.  Read by: Anselm Pancoast, P.A.-C  Original Report Authenticated By: Reola Calkins, M.D.    Medications / Allergies: per chart  Antibiotics: Anti-infectives     Start     Dose/Rate Route Frequency Ordered Stop   01/17/12 1700   vancomycin (VANCOCIN) 500 mg in sodium chloride 0.9 % 100 mL IVPB        500 mg 100 mL/hr over 60 Minutes Intravenous Every 12 hours 01/17/12 0651     01/17/12 1300  piperacillin-tazobactam (ZOSYN) IVPB 3.375 g       3.375 g 12.5 mL/hr over 240 Minutes  Intravenous Every 8 hours 01/17/12 0651     01/17/12 0415   vancomycin (VANCOCIN) injection 1 g  Status:  Discontinued        1 g Intravenous  Once 01/17/12  0350 01/17/12 0401   01/17/12 0415  piperacillin-tazobactam (ZOSYN) IVPB 4.5 g       4.5 g 200 mL/hr over 30 Minutes Intravenous  Once 01/17/12 0350 01/17/12 0452   01/17/12 0415   vancomycin (VANCOCIN) IVPB 1000 mg/200 mL premix        1,000 mg 200 mL/hr over 60 Minutes Intravenous To Emergency Dept 01/17/12 0401 01/17/12 0604   01/17/12 0145   azithromycin (ZITHROMAX) 500 mg in dextrose 5 % 250 mL IVPB        500 mg 250 mL/hr over 60 Minutes Intravenous  Once 01/17/12 0141 01/17/12 0410   01/17/12 0145   cefTRIAXone (ROCEPHIN) 1 g in dextrose 5 % 50 mL IVPB        1 g 100 mL/hr over 30 Minutes Intravenous  Once 01/17/12 0141 01/17/12 0243          Assessment  Arletta Bale  78 y.o. female       Problem List:  Principal Problem:  *Pleural effusion, bilateral Active Problems:  ANEMIA, IRON DEFICIENCY  HYPERTENSION  ARTERIOVENOUS MALFORMATION  Praesophageal hiatal hernia - giant, irreducible  Acute respiratory failure  CHF (congestive heart failure)  Hypokalemia  Confusion  Bilateral effusion - tapped Mild/mod confusion  Plan: -keep in ICU -pulmonary toilet Retry liquids, adv to pureed as tolerated.  UGI if c/o chest pain/abd pain but no ev of leak/abscess -allow to rest & follow off narcotics.  If MS not better of worsens, prob needs CT?MRI head - defer to medicine -VTE prophylaxis- SCDs, etc -mobilize as tolerated to help recovery  Ardeth Sportsman, M.D., F.A.C.S. Gastrointestinal and Minimally Invasive Surgery [facility] [address], [address] [place], [place] [address] [phone] Main / Paging [phone] Voice Mail   01/18/2012

## 2012-01-18 NOTE — Progress Notes (Signed)
Physical Therapy Evaluation Patient Details Name: Brooke [name] MRN: [id number] DOB: [birth year] Today's Date: 01/18/2012 Time: 4132-4401  Eval II Problem List:  Patient Active Problem List  Diagnoses  . ANEMIA, IRON DEFICIENCY  . HYPERTENSION  . HEMORRHOIDS, INTERNAL  . GERD  . DIVERTICULOSIS, COLON  . CONSTIPATION  . OSTEOPOROSIS  . ARTERIOVENOUS MALFORMATION  . COLONIC POLYPS, HYPERPLASTIC, HX OF  . Praesophageal hiatal hernia - giant, irreducible  . Acute respiratory failure  . CHF (congestive heart failure)  . Pleural effusion, bilateral  . Hypokalemia  . Confusion    Past Medical History:  Past Medical History  Diagnosis Date  . Diverticulosis   . Osteoporosis   . Hypertension   . Iron deficiency anemia   . Cameron lesion, acute   . AVM (arteriovenous malformation)   . GERD (gastroesophageal reflux disease)   . Internal hemorrhoid   . Hiatal hernia   . Diverticulosis   . Cough   . Paraesophageal hernia   . Arthritis     hands   . History of hypokalemia   . Hyperlipidemia   . History of colon polyps   . Paraesophageal hernia     history of  . H/O: GI bleed   . H/O: hematuria    Past Surgical History:  Past Surgical History  Procedure Date  . Colonscopy   . Polypectomy   . Cesarean section 01/1965  . Dilation and curettage of uterus   . Hernia repair 12/2011  . Laparoscopic nissen fundoplication 01/13/2012    Procedure: LAPAROSCOPIC NISSEN FUNDOPLICATION;  Surgeon: Ardeth Sportsman, MD;  Location: [facility];  Service: General;  Laterality: N/A;  laparoscopic paraesphogeal hernia repair and fundoplication    PT Assessment/Plan/Recommendation PT Assessment Clinical Impression Statement: Pt presents with diagnosis of bilateral pleural effusions. Pt will benefit from skilled PT in the acute care setting to maximize independence with basic functional mobility in preperation for D/C home. Recommended daily ambulation with nursing/family. PT  Recommendation/Assessment: Patient will need skilled PT in the acute care venue PT Problem List: Decreased mobility;Decreased activity tolerance PT Therapy Diagnosis : Difficulty walking PT Plan PT Frequency: Min 3X/week PT Treatment/Interventions: Gait training;Stair training PT Recommendation Follow Up Recommendations: No PT follow up Equipment Recommended: None recommended by PT PT Goals  Acute Rehab PT Goals PT Goal Formulation: With patient Pt will Ambulate: >150 feet;with modified independence (with O2 sats > 90% on RA) PT Goal: Ambulate - Progress: Goal set today Pt will Go Up / Down Stairs: 3-5 stairs (3 steps) PT Goal: Up/Down Stairs - Progress: Goal set today  PT Evaluation Precautions/Restrictions    Prior Functioning  Home Living Lives With: Spouse Receives Help From: Family Type of Home: House Home Layout: One level Home Access: Stairs to enter Entrance Stairs-Rails: Right Entrance Stairs-Number of Steps: 3 Home Adaptive Equipment: None Prior Function Level of Independence: Independent with basic ADLs;Independent with gait Driving: Yes Cognition Cognition Arousal/Alertness: Awake/alert Overall Cognitive Status: Appears within functional limits for tasks assessed Orientation Level: Oriented X4 Sensation/Coordination Sensation Light Touch: Appears Intact Coordination Gross Motor Movements are Fluid and Coordinated: Yes Extremity Assessment RLE Assessment RLE Assessment: Within Functional Limits LLE Assessment LLE Assessment: Within Functional Limits Mobility (including Balance) Bed Mobility Bed Mobility: Yes Supine to Sit: 6: Modified independent (Device/Increase time) Transfers Transfers: Yes Sit to Stand: 6: Modified independent (Device/Increase time) Stand to Sit: 6: Modified independent (Device/Increase time) Ambulation/Gait Ambulation/Gait: Yes Ambulation/Gait Assistance Details (indicate cue type and reason): Min-guard assist. slightly  unsteady but  no LOB Ambulation Distance (Feet): 150 Feet (x 2. seated rest break) Assistive device: None Gait Pattern: Step-through pattern    Exercise    End of Session PT - End of Session Equipment Utilized During Treatment: Gait belt Activity Tolerance: Patient tolerated treatment well Patient left: in chair;with call bell in reach;with family/visitor present General Behavior During Session: [facility] for tasks performed Cognition: [facility] for tasks performed  Rebeca Alert [facility] 01/18/2012, 3:01 PM [phone]

## 2012-01-19 LAB — BASIC METABOLIC PANEL
BUN: 13 mg/dL (ref 6–23)
Chloride: 96 mEq/L (ref 96–112)
Creatinine, Ser: 1.05 mg/dL (ref 0.50–1.10)
GFR calc non Af Amer: 50 mL/min — ABNORMAL LOW (ref >90)
Glucose, Bld: 105 mg/dL — ABNORMAL HIGH (ref 70–99)
Potassium: 2.7 mEq/L — CL (ref 3.5–5.1)

## 2012-01-19 LAB — DIFFERENTIAL
Basophils Relative: 0 % (ref 0–1)
Eosinophils Relative: 1 % (ref 0–5)
Lymphs Abs: 1.6 10*3/uL (ref 0.7–4.0)
Monocytes Relative: 11 % (ref 3–12)

## 2012-01-19 LAB — CBC
HCT: 27.4 % — ABNORMAL LOW (ref 36.0–46.0)
Hemoglobin: 9 g/dL — ABNORMAL LOW (ref 12.0–15.0)
MCHC: 32.8 g/dL (ref 30.0–36.0)
MCV: 78.1 fL (ref 78.0–100.0)

## 2012-01-19 LAB — MAGNESIUM: Magnesium: 2 mg/dL (ref 1.5–2.5)

## 2012-01-19 LAB — VANCOMYCIN, TROUGH: Vancomycin Tr: 11.3 ug/mL (ref 10.0–20.0)

## 2012-01-19 MED ORDER — HYDROCODONE-ACETAMINOPHEN 5-325 MG PO TABS: 1.0000 | ORAL_TABLET | Freq: Four times a day (QID) | ORAL | Status: DC | PRN

## 2012-01-19 MED ORDER — SODIUM CHLORIDE 0.9 % IV SOLN
INTRAVENOUS | Status: DC
  Administered 2012-01-18: 10 mL/h via INTRAVENOUS

## 2012-01-19 MED ORDER — POTASSIUM CHLORIDE 10 MEQ/100ML IV SOLN
10.0000 meq | INTRAVENOUS | Status: AC
  Administered 2012-01-19 (×3): 10 meq via INTRAVENOUS
  Filled 2012-01-19 (×3): qty 100

## 2012-01-19 MED ORDER — SODIUM CHLORIDE 0.9 % IV SOLN
750.0000 mg | Freq: Two times a day (BID) | INTRAVENOUS | Status: DC
  Administered 2012-01-20: 750 mg via INTRAVENOUS
  Filled 2012-01-19 (×3): qty 750

## 2012-01-19 MED ORDER — METOPROLOL TARTRATE 25 MG/10 ML ORAL SUSPENSION
25.0000 mg | Freq: Two times a day (BID) | ORAL | Status: DC
  Administered 2012-01-19 – 2012-01-21 (×5): 25 mg via ORAL
  Filled 2012-01-19 (×7): qty 10

## 2012-01-19 MED ORDER — SODIUM CHLORIDE 0.9 % IV BOLUS (SEPSIS)
500.0000 mL | Freq: Once | INTRAVENOUS | Status: AC
  Administered 2012-01-19: 500 mL via INTRAVENOUS

## 2012-01-19 MED ORDER — METOPROLOL SUCCINATE ER 50 MG PO TB24
50.0000 mg | ORAL_TABLET | Freq: Every day | ORAL | Status: DC
  Filled 2012-01-19: qty 1

## 2012-01-19 NOTE — Progress Notes (Signed)
Patient placed on 2L nasal cannula due to O2 levels staying in the low 90's this morning when asleep. Will remove O2 when patient wakes up.

## 2012-01-19 NOTE — Progress Notes (Signed)
Potassium currently infusing. Patient has sleep throughout the night and only woke up to use the bedside commode.

## 2012-01-19 NOTE — Progress Notes (Signed)
Pt's BP 106/46 (58).  Brooke [name] ([facility]) made aware. No further actions indicated at this time.

## 2012-01-19 NOTE — Evaluation (Addendum)
Occupational Therapy Evaluation Patient Details Name: Brooke [name] MRN: [id number] DOB: May 18, 1932 Today's Date: 01/19/2012 Time in: 9:00 am Time out: 9:40 am Eval II [place]  Problem List:  Patient Active Problem List  Diagnoses  . ANEMIA, IRON DEFICIENCY  . HYPERTENSION  . HEMORRHOIDS, INTERNAL  . GERD  . DIVERTICULOSIS, COLON  . CONSTIPATION  . OSTEOPOROSIS  . ARTERIOVENOUS MALFORMATION  . COLONIC POLYPS, HYPERPLASTIC, HX OF  . Praesophageal hiatal hernia - giant, irreducible  . Acute respiratory failure  . CHF (congestive heart failure)  . Pleural effusion, bilateral  . Hypokalemia  . Confusion    Past Medical History:  Past Medical History  Diagnosis Date  . Diverticulosis   . Osteoporosis   . Hypertension   . Iron deficiency anemia   . Cameron lesion, acute   . AVM (arteriovenous malformation)   . GERD (gastroesophageal reflux disease)   . Internal hemorrhoid   . Hiatal hernia   . Diverticulosis   . Cough   . Paraesophageal hernia   . Arthritis     hands   . History of hypokalemia   . Hyperlipidemia   . History of colon polyps   . Paraesophageal hernia     history of  . H/O: GI bleed   . H/O: hematuria    Past Surgical History:  Past Surgical History  Procedure Date  . Colonscopy   . Polypectomy   . Cesarean section 01/1965  . Dilation and curettage of uterus   . Hernia repair 12/2011  . Laparoscopic nissen fundoplication 01/13/2012    Procedure: LAPAROSCOPIC NISSEN FUNDOPLICATION;  Surgeon: Brooke Sportsman, MD;  Location: [facility];  Service: General;  Laterality: N/A;  laparoscopic paraesphogeal hernia repair and fundoplication    OT Assessment/Plan/Recommendation OT Assessment Clinical Impression Statement: Pt tolerated eval very well and her O2 sats were 95-97% with activity. No followup OT needs. Recommended pt use walk in shower initially to have built in seat available if she needs to rest. Pt verbalized understanding. Has 24/7 care. OT  Recommendation/Assessment: Patient does not need any further OT services OT Recommendation Follow Up Recommendations: No OT follow up Equipment Recommended: None recommended by OT OT Goals  No further OT needs.  OT Evaluation Precautions/Restrictions  Restrictions Weight Bearing Restrictions: No Prior Functioning Home Living Lives With: Spouse Receives Help From: Family Type of Home: House Home Layout: One level Home Access: Stairs to enter Entrance Stairs-Rails: Right Entrance Stairs-Number of Steps: 3 Bathroom Shower/Tub: Psychologist, counselling;Other (comment) (built in seat) Bathroom Toilet: Handicapped height (with grab bars) Home Adaptive Equipment: Grab bars around toilet;Other (comment) (built in shower seat) Prior Function Level of Independence: Independent with basic ADLs;Independent with gait;Independent with homemaking with ambulation Driving: Yes ADL ADL Eating/Feeding: Set up;Performed Eating/Feeding Details (indicate cue type and reason): pt with some difficulty opening jello container Where Assessed - Eating/Feeding: Chair Grooming: Wash/dry hands;Performed;Set up Where Assessed - Grooming: Sitting, bed Upper Body Bathing: Performed;Chest;Right arm;Left arm;Abdomen;Set up Where Assessed - Upper Body Bathing: Sitting, bed;Unsupported Lower Body Bathing: Performed;Supervision/safety Where Assessed - Lower Body Bathing: Sit to stand from bed Upper Body Dressing: Simulated;Set up Where Assessed - Upper Body Dressing: Sitting, bed;Unsupported Lower Body Dressing: Simulated;Supervision/safety Where Assessed - Lower Body Dressing: Sit to stand from bed Toilet Transfer: Performed;Supervision/safety Toilet Transfer Details (indicate cue type and reason): no assistive device. supervision for multiple lines, IV Toilet Transfer Method: Freight forwarder Equipment: Bedside commode;Other (comment) (across the room) Toileting - Clothing Manipulation:  Simulated;Supervision/safety Toileting - Clothing  Manipulation Details (indicate cue type and reason): with gown Where Assessed - Toileting Clothing Manipulation: Standing Toileting - Hygiene: Performed;Set up Where Assessed - Toileting Hygiene: Sit on 3-in-1 or toilet Tub/Shower Transfer: Not assessed Tub/Shower Transfer Method: Not assessed ADL Comments: Pt tolerated bath session very well. No SOB reported and sats remained 95-97% throughout activity. Left off O2 and nsg aware. Pt with no concerns regarding ADL for discharge and husband available to assist PRN. Pt has all necessary DME and states that though she usually uses spa tub she will use shower stall at discharge. She doesnt feel she needs to practice shower transfer.  Vision/Perception    Cognition Cognition Arousal/Alertness: Awake/alert Overall Cognitive Status: Appears within functional limits for tasks assessed Orientation Level: Oriented X4 Sensation/Coordination Sensation Light Touch: Appears Intact Extremity Assessment RUE Assessment RUE Assessment: Within Functional Limits LUE Assessment LUE Assessment: Within Functional Limits Mobility  Bed Mobility Supine to Sit: 6: Modified independent (Device/Increase time) Transfers Transfers: Yes Sit to Stand: 6: Modified independent (Device/Increase time);From chair/3-in-1;From bed Stand to Sit: 6: Modified independent (Device/Increase time);To bed;To chair/3-in-1 Exercises   End of Session OT - End of Session Equipment Utilized During Treatment: Other (comment) (none) Activity Tolerance: Patient tolerated treatment well Patient left: in chair;with call bell in reach Nurse Communication: Mobility status for transfers General Behavior During Session: [facility] for tasks performed Cognition: [facility] for tasks performed   Brooke [name] [phone] 01/19/2012, 10:09 AM

## 2012-01-19 NOTE — Progress Notes (Signed)
E Link RN, Mark, paged regarding pt's potassium of 2.7, and asked if Dr. Darrick Penna would write orders for potassium. Awaiting reply.

## 2012-01-19 NOTE — Progress Notes (Signed)
ANTIBIOTIC CONSULT NOTE - follow up  Pharmacy Consult for Vancomycin & Zosyn Indication: HCAP  No Known Allergies  Patient Measurements: Height: 5\' 1"  (154.9 cm) Weight: 153 lb 6.4 oz (69.582 kg) IBW/kg (Calculated) : 47.8   Vital Signs: Temp: 98.1 F (36.7 C) (01/23 1425) Temp src: Oral (01/23 1425) BP: 139/82 mmHg (01/23 1425) Pulse Rate: 80  (01/23 1425) Intake/Output from previous day: 01/22 0701 - 01/23 0700 In: 1179 [I.V.:530; IV Piggyback:639] Out: 1550 [Urine:1550] Intake/Output from this shift: Total I/O In: 894.7 [P.O.:480; I.V.:189.8; IV Piggyback:224.9] Out: 475 [Urine:475]  Labs:  [facility] 01/19/12 0315 01/18/12 0301 01/17/12 1548 01/17/12 0733  WBC 12.1* 15.7* -- 17.3*  HGB 9.0* 8.9* -- 9.1*  PLT 270 272 -- 278  LABCREA -- -- -- --  CREATININE 1.05 0.79 0.75 --   Estimated Creatinine Clearance: 40 ml/min (by C-G formula based on Cr of 1.05).  Basename 01/19/12 1635  VANCOTROUGH 11.3  VANCOPEAK --  VANCORANDOM --  GENTTROUGH --  GENTPEAK --  GENTRANDOM --  TOBRATROUGH --  TOBRAPEAK --  TOBRARND --  AMIKACINPEAK --  AMIKACINTROU --  AMIKACIN --     Microbiology: Recent Results (from the past 720 hour(s))  SURGICAL PCR SCREEN     Status: Normal   Collection Time   01/06/12  8:32 AM      Component Value Range Status Comment   MRSA, PCR NEGATIVE  NEGATIVE  Final    Staphylococcus aureus NEGATIVE  NEGATIVE  Final   CULTURE, BLOOD (ROUTINE X 2)     Status: Normal (Preliminary result)   Collection Time   01/17/12  2:00 AM      Component Value Range Status Comment   Specimen Description BLOOD RIGHT HAND   Final    Special Requests BOTTLES DRAWN AEROBIC AND ANAEROBIC 5 CC EA   Final    Setup Time [id number]   Final    Culture     Final    Value:        BLOOD CULTURE RECEIVED NO GROWTH TO DATE CULTURE WILL BE HELD FOR 5 DAYS BEFORE ISSUING A FINAL NEGATIVE REPORT   Report Status PENDING   Incomplete   CULTURE, BLOOD (ROUTINE X 2)     Status:  Normal (Preliminary result)   Collection Time   01/17/12  2:20 AM      Component Value Range Status Comment   Specimen Description BLOOD RIGHT FOREARM   Final    Special Requests BOTTLES DRAWN AEROBIC AND ANAEROBIC 5 CC EA   Final    Setup Time [id number]   Final    Culture     Final    Value:        BLOOD CULTURE RECEIVED NO GROWTH TO DATE CULTURE WILL BE HELD FOR 5 DAYS BEFORE ISSUING A FINAL NEGATIVE REPORT   Report Status PENDING   Incomplete   MRSA PCR SCREENING     Status: Normal   Collection Time   01/17/12  7:24 AM      Component Value Range Status Comment   MRSA by PCR NEGATIVE  NEGATIVE  Final   BODY FLUID CULTURE     Status: Normal (Preliminary result)   Collection Time   01/17/12  2:21 PM      Component Value Range Status Comment   Specimen Description THORACENTESIS   Final    Special Requests NONE   Final    Gram Stain     Final    Value: FEW WBC PRESENT,  PREDOMINANTLY MONONUCLEAR     NO ORGANISMS SEEN   Culture NO GROWTH 1 DAY   Final    Report Status PENDING   Incomplete   PATHOLOGIST SMEAR REVIEW     Status: Normal   Collection Time   01/17/12  2:21 PM      Component Value Range Status Comment   Tech Review Reviewed By Havery Moros, M.D.   Final    Medical History: Past Medical History  Diagnosis Date  . Diverticulosis   . Osteoporosis   . Hypertension   . Iron deficiency anemia   . Cameron lesion, acute   . AVM (arteriovenous malformation)   . GERD (gastroesophageal reflux disease)   . Internal hemorrhoid   . Hiatal hernia   . Diverticulosis   . Cough   . Paraesophageal hernia   . Arthritis     hands   . History of hypokalemia   . Hyperlipidemia   . History of colon polyps   . Paraesophageal hernia     history of  . H/O: GI bleed   . H/O: hematuria    Medications:  Scheduled:     . perphenazine  4 mg Oral QHS   And  . amitriptyline  20 mg Oral QHS  . enoxaparin  40 mg Subcutaneous Q24H  . furosemide  40 mg Intravenous Q12H  .  metoprolol tartrate  25 mg Oral BID  . piperacillin-tazobactam (ZOSYN)  IV  3.375 g Intravenous Q8H  . potassium chloride  10 mEq Intravenous Q1 Hr x 3  . psyllium  1 packet Oral BID  . sodium chloride  500 mL Intravenous Once  . vancomycin  500 mg Intravenous Q12H  . DISCONTD: antiseptic oral rinse  15 mL Mouth Rinse q12n4p  . DISCONTD: chlorhexidine  15 mL Mouth Rinse BID  . DISCONTD: Flora-Q  1 capsule Oral Daily  . DISCONTD: metoprolol succinate  50 mg Oral QPC breakfast  . DISCONTD: metoprolol succinate  50 mg Oral QPC breakfast   Assessment:  78 yo F with recent hernia repair, admitted with suspected HCAP.  Vanc trough below goal range.  SCr wnl.  Goal of Therapy:  Vancomycin trough level 15-20 mcg/ml  Plan:  Increase Vanc to 750mg  q12h Cont Zosyn 3.375gm IV q8h (extended infusion) Cont to f/u daily.  Reece Packer 01/19/2012,6:24 PM

## 2012-01-19 NOTE — Progress Notes (Addendum)
Brooke [name] 04-13-1932 [id number]  PCP: Cam Hai, CNM, CNM Outpatient Care Team: Patient Care Team: Cam Hai, CNM as PCP - General (Obstetrics and Gynecology) Eliezer Bottom., MD,FACG as Consulting Physician (Gastroenterology)  Inpatient Treatment Team: Treatment Team: Attending Provider: Ramiro Harvest, MD; Technician: Heloise Ochoa, EMT; Rounding Team: Massie Kluver, MD; Registered Nurse: [name], RN; Consulting Physician: Ardeth Sportsman, MD; Registered Nurse: Doylene Canard, RN; Respiratory Therapist: Fontaine No, RRT; Registered Nurse: Belva Chimes, RN  Subjective: Pt feeling better   RN in room No chest/abd pain Tol pureed diet No N/V C/o L wrist soreness at IV site  Objective:  Vital signs:  Temp:  [97.5 F (36.4 C)-99.2 F (37.3 C)] 98.6 F (37 C) (01/23 0400) Pulse Rate:  [73-93] 90  (01/23 0600) Resp:  [15-23] 23  (01/23 0600) BP: (104-130)/(43-76) 120/49 mmHg (01/23 0600) SpO2:  [91 %-99 %] 98 % (01/23 0600) Weight:  [149 lb 14.6 oz (68 kg)] 149 lb 14.6 oz (68 kg) (01/23 0600) Last BM Date:  (prior to surgery; not during this admission)  Intake/Output   Yesterday:  01/22 0701 - 01/23 0700 In: 1079 [I.V.:530; IV Piggyback:539] Out: 1550 [Urine:1550] This shift:  Total I/O In: 786 [I.V.:530; IV Piggyback:256] Out: 650 [Urine:650]  Bowel function:  Flatus: Yes  BM: No  Physical Exam:  General: Pt awake, alert, smiling, oriented x 4 Psych:  No psychosis/paranoia.  Not confused to me. HENT: Normocephalic, Mucus membranes moist.  No thrush Chest: Dec BS at bases No chest wall pain w good excursion Abdomen: Soft, Obese.  Nontender/Nondistended.  No incarcerated hernias.  Incisions c/d/i Ext:  SCDs BLE.  No mjr edema.  No cyanosis.  NO hematoma/edema at R wrist IV site.  MIld TTP Skin: No petechiae / purpurae  Results:   Labs: Results for orders placed during the hospital encounter of 01/17/12  (from the past 48 hour(s))  MRSA PCR SCREENING     Status: Normal   Collection Time   01/17/12  7:24 AM      Component Value Range Comment   MRSA by PCR NEGATIVE  NEGATIVE    OSMOLALITY, URINE     Status: Abnormal   Collection Time   01/17/12  7:31 AM      Component Value Range Comment   Osmolality, Ur 323 (*) 390 - 1090 (mOsm/kg)   SODIUM, URINE, RANDOM     Status: Normal   Collection Time   01/17/12  7:31 AM      Component Value Range Comment   Sodium, Ur 99     MAGNESIUM     Status: Normal   Collection Time   01/17/12  7:33 AM      Component Value Range Comment   Magnesium 1.9  1.5 - 2.5 (mg/dL)   OSMOLALITY     Status: Abnormal   Collection Time   01/17/12  7:33 AM      Component Value Range Comment   Osmolality 272 (*) 275 - 300 (mOsm/kg)   TSH     Status: Normal   Collection Time   01/17/12  7:33 AM      Component Value Range Comment   TSH 1.928  0.350 - 4.500 (uIU/mL)   LIPID PANEL     Status: Abnormal   Collection Time   01/17/12  7:33 AM      Component Value Range Comment   Cholesterol 97  0 - 200 (mg/dL)    Triglycerides  91  <150 (mg/dL)    HDL 35 (*) >16 (mg/dL)    Total CHOL/HDL Ratio 2.8      VLDL 18  0 - 40 (mg/dL)    LDL Cholesterol 44  0 - 99 (mg/dL)   LACTATE DEHYDROGENASE     Status: Abnormal   Collection Time   01/17/12  7:33 AM      Component Value Range Comment   LD 391 (*) 94 - 250 (U/L)   PROTEIN, TOTAL     Status: Normal   Collection Time   01/17/12  7:33 AM      Component Value Range Comment   Total Protein 6.4  6.0 - 8.3 (g/dL)   CBC     Status: Abnormal   Collection Time   01/17/12  7:33 AM      Component Value Range Comment   WBC 17.3 (*) 4.0 - 10.5 (K/uL)    RBC 3.57 (*) 3.87 - 5.11 (MIL/uL)    Hemoglobin 9.1 (*) 12.0 - 15.0 (g/dL)    HCT 10.9 (*) 60.4 - 46.0 (%)    MCV 78.2  78.0 - 100.0 (fL)    MCH 25.5 (*) 26.0 - 34.0 (pg)    MCHC 32.6  30.0 - 36.0 (g/dL)    RDW 54.0 (*) 98.1 - 15.5 (%)    Platelets 278  150 - 400 (K/uL)     CREATININE, SERUM     Status: Abnormal   Collection Time   01/17/12  7:33 AM      Component Value Range Comment   Creatinine, Ser 0.69  0.50 - 1.10 (mg/dL)    GFR calc non Af Amer 82 (*) >90 (mL/min)    GFR calc Af Amer >90  >90 (mL/min)   CARDIAC PANEL(CRET KIN+CKTOT+MB+TROPI)     Status: Abnormal   Collection Time   01/17/12  7:33 AM      Component Value Range Comment   Total CK 880 (*) 7 - 177 (U/L)    CK, MB 4.1 (*) 0.3 - 4.0 (ng/mL)    Troponin I <0.30  <0.30 (ng/mL)    Relative Index 0.5  0.0 - 2.5    LEGIONELLA ANTIGEN, URINE     Status: Normal   Collection Time   01/17/12  8:11 AM      Component Value Range Comment   Specimen Description URINE, CATHETERIZED      Special Requests NONE      Legionella Antigen, Urine Negative for Legionella pneumophilia serogroup 1      Report Status 01/18/2012 FINAL     STREP PNEUMONIAE URINARY ANTIGEN     Status: Normal   Collection Time   01/17/12  8:11 AM      Component Value Range Comment   Strep Pneumo Urinary Antigen NEGATIVE  NEGATIVE    BODY FLUID CELL COUNT WITH DIFFERENTIAL     Status: Abnormal   Collection Time   01/17/12  2:21 PM      Component Value Range Comment   Fluid Type-FCT THORACENTESIS   CORRECTED ON 01/21 AT 1431: PREVIOUSLY REPORTED AS Body Fluid,   Color, Fluid RED (*) YELLOW     Appearance, Fluid TURBID (*) CLEAR     WBC, Fluid 3829 (*) 0 - 1000 (cu mm)    Neutrophil Count, Fluid 81 (*) 0 - 25 (%)    Lymphs, Fluid 15      Monocyte-Macrophage-Serous Fluid 4 (*) 50 - 90 (%)   BODY FLUID CULTURE  Status: Normal (Preliminary result)   Collection Time   01/17/12  2:21 PM      Component Value Range Comment   Specimen Description THORACENTESIS      Special Requests NONE      Gram Stain        Value: FEW WBC PRESENT, PREDOMINANTLY MONONUCLEAR     NO ORGANISMS SEEN   Culture NO GROWTH      Report Status PENDING     GLUCOSE, SEROUS FLUID     Status: Normal   Collection Time   01/17/12  2:21 PM      Component  Value Range Comment   Glucose, Fluid 136      Fluid Type-FGLU THORACENTESIS   CORRECTED ON 01/21 AT 1431: PREVIOUSLY REPORTED AS Body Fluid,  LACTATE DEHYDROGENASE, BODY FLUID     Status: Abnormal   Collection Time   01/17/12  2:21 PM      Component Value Range Comment   LD, Fluid 1619 (*) 3 - 23 (U/L)    Fluid Type-FLDH THORACENTESIS   CORRECTED ON 01/21 AT 1431: PREVIOUSLY REPORTED AS Body Fluid,  PROTEIN, BODY FLUID     Status: Normal   Collection Time   01/17/12  2:21 PM      Component Value Range Comment   Total protein, fluid 3.6   NO NORMAL RANGE ESTABLISHED FOR THIS TEST   Fluid Type-FTP THORACENTESIS   CORRECTED ON 01/21 AT 1431: PREVIOUSLY REPORTED AS Body Fluid,  PATHOLOGIST SMEAR REVIEW     Status: Normal   Collection Time   01/17/12  2:21 PM      Component Value Range Comment   Tech Review Reviewed By Havery Moros, M.D.     BASIC METABOLIC PANEL     Status: Abnormal   Collection Time   01/17/12  3:48 PM      Component Value Range Comment   Sodium 133 (*) 135 - 145 (mEq/L)    Potassium 3.2 (*) 3.5 - 5.1 (mEq/L)    Chloride 90 (*) 96 - 112 (mEq/L)    CO2 31  19 - 32 (mEq/L)    Glucose, Bld 130 (*) 70 - 99 (mg/dL)    BUN 6  6 - 23 (mg/dL)    Creatinine, Ser 1.61  0.50 - 1.10 (mg/dL)    Calcium 7.7 (*) 8.4 - 10.5 (mg/dL)    GFR calc non Af Amer 80 (*) >90 (mL/min)    GFR calc Af Amer >90  >90 (mL/min)   CARDIAC PANEL(CRET KIN+CKTOT+MB+TROPI)     Status: Abnormal   Collection Time   01/17/12  3:48 PM      Component Value Range Comment   Total CK 783 (*) 7 - 177 (U/L)    CK, MB 2.8  0.3 - 4.0 (ng/mL)    Troponin I <0.30  <0.30 (ng/mL)    Relative Index 0.4  0.0 - 2.5    PRO B NATRIURETIC PEPTIDE     Status: Abnormal   Collection Time   01/18/12  3:01 AM      Component Value Range Comment   Pro B Natriuretic peptide (BNP) 1181.0 (*) 0 - 450 (pg/mL)   BASIC METABOLIC PANEL     Status: Abnormal   Collection Time   01/18/12  3:01 AM      Component Value Range Comment     Sodium 133 (*) 135 - 145 (mEq/L)    Potassium 3.1 (*) 3.5 - 5.1 (mEq/L)    Chloride 93 (*)  96 - 112 (mEq/L)    CO2 30  19 - 32 (mEq/L)    Glucose, Bld 113 (*) 70 - 99 (mg/dL)    BUN 9  6 - 23 (mg/dL)    Creatinine, Ser 1.61  0.50 - 1.10 (mg/dL)    Calcium 7.6 (*) 8.4 - 10.5 (mg/dL)    GFR calc non Af Amer 78 (*) >90 (mL/min)    GFR calc Af Amer >90  >90 (mL/min)   CBC     Status: Abnormal   Collection Time   01/18/12  3:01 AM      Component Value Range Comment   WBC 15.7 (*) 4.0 - 10.5 (K/uL)    RBC 3.63 (*) 3.87 - 5.11 (MIL/uL)    Hemoglobin 8.9 (*) 12.0 - 15.0 (g/dL)    HCT 09.6 (*) 04.5 - 46.0 (%)    MCV 78.0  78.0 - 100.0 (fL)    MCH 24.5 (*) 26.0 - 34.0 (pg)    MCHC 31.4  30.0 - 36.0 (g/dL)    RDW 40.9 (*) 81.1 - 15.5 (%)    Platelets 272  150 - 400 (K/uL)   DIFFERENTIAL     Status: Abnormal   Collection Time   01/18/12  3:01 AM      Component Value Range Comment   Neutrophils Relative 82 (*) 43 - 77 (%)    Neutro Abs 12.9 (*) 1.7 - 7.7 (K/uL)    Lymphocytes Relative 8 (*) 12 - 46 (%)    Lymphs Abs 1.3  0.7 - 4.0 (K/uL)    Monocytes Relative 9  3 - 12 (%)    Monocytes Absolute 1.5 (*) 0.1 - 1.0 (K/uL)    Eosinophils Relative 0  0 - 5 (%)    Eosinophils Absolute 0.0  0.0 - 0.7 (K/uL)    Basophils Relative 0  0 - 1 (%)    Basophils Absolute 0.0  0.0 - 0.1 (K/uL)   MAGNESIUM     Status: Normal   Collection Time   01/18/12  3:01 AM      Component Value Range Comment   Magnesium 2.0  1.5 - 2.5 (mg/dL)   FOLATE     Status: Normal   Collection Time   01/18/12  9:22 AM      Component Value Range Comment   Folate 6.2     IRON AND TIBC     Status: Abnormal   Collection Time   01/18/12  9:22 AM      Component Value Range Comment   Iron 14 (*) 42 - 135 (ug/dL)    TIBC 914  782 - 956 (ug/dL)    Saturation Ratios 6 (*) 20 - 55 (%)    UIBC 240  125 - 400 (ug/dL)   FERRITIN     Status: Abnormal   Collection Time   01/18/12  9:22 AM      Component Value Range Comment    Ferritin 447 (*) 10 - 291 (ng/mL)   RPR     Status: Normal   Collection Time   01/18/12  9:23 AM      Component Value Range Comment   RPR NON REACTIVE  NON REACTIVE    VITAMIN B12     Status: Normal   Collection Time   01/18/12  9:23 AM      Component Value Range Comment   Vitamin B-12 640  211 - 911 (pg/mL)   DIFFERENTIAL     Status: Abnormal   Collection  Time   01/19/12  3:15 AM      Component Value Range Comment   Neutrophils Relative 75  43 - 77 (%)    Lymphocytes Relative 13  12 - 46 (%)    Monocytes Relative 11  3 - 12 (%)    Eosinophils Relative 1  0 - 5 (%)    Basophils Relative 0  0 - 1 (%)    Neutro Abs 9.1 (*) 1.7 - 7.7 (K/uL)    Lymphs Abs 1.6  0.7 - 4.0 (K/uL)    Monocytes Absolute 1.3 (*) 0.1 - 1.0 (K/uL)    Eosinophils Absolute 0.1  0.0 - 0.7 (K/uL)    Basophils Absolute 0.0  0.0 - 0.1 (K/uL)    WBC Morphology TOXIC GRANULATION     CBC     Status: Abnormal   Collection Time   01/19/12  3:15 AM      Component Value Range Comment   WBC 12.1 (*) 4.0 - 10.5 (K/uL)    RBC 3.51 (*) 3.87 - 5.11 (MIL/uL)    Hemoglobin 9.0 (*) 12.0 - 15.0 (g/dL)    HCT 45.4 (*) 09.8 - 46.0 (%)    MCV 78.1  78.0 - 100.0 (fL)    MCH 25.6 (*) 26.0 - 34.0 (pg)    MCHC 32.8  30.0 - 36.0 (g/dL)    RDW 11.9 (*) 14.7 - 15.5 (%)    Platelets 270  150 - 400 (K/uL)   BASIC METABOLIC PANEL     Status: Abnormal   Collection Time   01/19/12  3:15 AM      Component Value Range Comment   Sodium 136  135 - 145 (mEq/L)    Potassium 2.7 (*) 3.5 - 5.1 (mEq/L)    Chloride 96  96 - 112 (mEq/L)    CO2 30  19 - 32 (mEq/L)    Glucose, Bld 105 (*) 70 - 99 (mg/dL)    BUN 13  6 - 23 (mg/dL)    Creatinine, Ser 8.29  0.50 - 1.10 (mg/dL)    Calcium 7.4 (*) 8.4 - 10.5 (mg/dL)    GFR calc non Af Amer 50 (*) >90 (mL/min)    GFR calc Af Amer 58 (*) >90 (mL/min)   MAGNESIUM     Status: Normal   Collection Time   01/19/12  3:15 AM      Component Value Range Comment   Magnesium 2.0  1.5 - 2.5 (mg/dL)   PRO B  NATRIURETIC PEPTIDE     Status: Abnormal   Collection Time   01/19/12  3:15 AM      Component Value Range Comment   Pro B Natriuretic peptide (BNP) 575.9 (*) 0 - 450 (pg/mL)     Imaging / Studies: Ct Head Wo Contrast  01/18/2012  *RADIOLOGY REPORT*  Clinical Data: Confusion.  Agitation.  CT HEAD WITHOUT CONTRAST  Technique:  Contiguous axial images were obtained from the base of the skull through the vertex without contrast.  Comparison: None.  Findings: No intracranial hemorrhage.  Small vessel disease type changes. No CT evidence of large acute infarct.  Small acute infarct cannot be excluded by CT.  Intracranial vascular calcifications.  Mild global atrophy without hydrocephalus.  No intracranial mass lesion detected on this unenhanced exam.  Visualized sinuses and mastoid air cells are clear.  IMPRESSION: No intracranial hemorrhage or CT evidence of large acute infarct.  Small vessel disease type changes.  Mild atrophy.  Original Report Authenticated By: Almedia Balls  Constance Goltz, M.D.   Dg Chest Port 1 View  01/18/2012  *RADIOLOGY REPORT*  Clinical Data: Congestive heart failure, follow-up  PORTABLE CHEST - 1 VIEW  Comparison: Portable chest x-ray of 01/17/2012  Findings: There is little change in aeration with basilar atelectasis and probable effusions left greater than right. Cardiomegaly is stable.  No bony abnormality is seen.  IMPRESSION: No significant change in suboptimal aeration with basilar atelectasis and probable effusions left greater than left.  Original Report Authenticated By: Juline Patch, M.D.   Dg Chest Port 1 View  01/17/2012  *RADIOLOGY REPORT*  Clinical Data: Status post right thoracentesis.  Evaluate for pneumothorax.  PORTABLE CHEST - 1 VIEW  Comparison: 01/17/2012  Findings: There are bilateral pleural effusions left greater than right.  When compared with the previous exam there is been decrease in volume of the right effusion.  No pneumothorax is identified.  There is atelectasis  identified in both lung bases.  IMPRESSION:  1.  Decrease in volume of right pleural effusion. 2.  No pneumothorax identified.  Original Report Authenticated By: Rosealee Albee, M.D.   US Thoracentesis Asp Pleural Space W/img Guide  01/17/2012  *RADIOLOGY REPORT*  Clinical Data: Bilateral pleural effusions of uncertain etiology. Request has been made for therapeutic and diagnostic thoracentesis  ULTRASOUND GUIDED right THORACENTESIS  Comparison:  Prior chest x-rays and ultrasound performed today revealed the right effusion was larger than the left.  Due to the patient's confusion, written consent was obtained from the patient's son in regards to the thoracentesis procedure.  Ultrasound was performed to localize and mark an adequate pocket of fluid in the right chest.  The area was then prepped and draped in the normal sterile fashion.  1% Lidocaine was used for local anesthesia.  Under ultrasound guidance a 19 gauge Yueh catheter was introduced.  Thoracentesis was performed.  The catheter was removed and a dressing applied.  Complications:  None immediate  Findings: A total of approximately 630 ml of bloody serous fluid was removed. A fluid sample was sent for laboratory analysis.  IMPRESSION: Successful ultrasound guided right thoracentesis yielding 630 ml of pleural fluid.  Read by: Anselm Pancoast, P.A.-C  Original Report Authenticated By: Reola Calkins, M.D.    Medications / Allergies: per chart  Antibiotics: Anti-infectives     Start     Dose/Rate Route Frequency Ordered Stop   01/17/12 1700   vancomycin (VANCOCIN) 500 mg in sodium chloride 0.9 % 100 mL IVPB        500 mg 100 mL/hr over 60 Minutes Intravenous Every 12 hours 01/17/12 0651     01/17/12 1300  piperacillin-tazobactam (ZOSYN) IVPB 3.375 g       3.375 g 12.5 mL/hr over 240 Minutes Intravenous Every 8 hours 01/17/12 0651     01/17/12 0415   vancomycin (VANCOCIN) injection 1 g  Status:  Discontinued        1 g Intravenous   Once 01/17/12 0350 01/17/12 0401   01/17/12 0415  piperacillin-tazobactam (ZOSYN) IVPB 4.5 g       4.5 g 200 mL/hr over 30 Minutes Intravenous  Once 01/17/12 0350 01/17/12 0452   01/17/12 0415   vancomycin (VANCOCIN) IVPB 1000 mg/200 mL premix        1,000 mg 200 mL/hr over 60 Minutes Intravenous To Emergency Dept 01/17/12 0401 01/17/12 0604   01/17/12 0145   azithromycin (ZITHROMAX) 500 mg in dextrose 5 % 250 mL IVPB  500 mg 250 mL/hr over 60 Minutes Intravenous  Once 01/17/12 0141 01/17/12 0410   01/17/12 0145   cefTRIAXone (ROCEPHIN) 1 g in dextrose 5 % 50 mL IVPB        1 g 100 mL/hr over 30 Minutes Intravenous  Once 01/17/12 0141 01/17/12 0243          Assessment  Brooke [name]  78 y.o. female       Problem List:  Principal Problem:  *Pleural effusion, bilateral Active Problems:  ANEMIA, IRON DEFICIENCY  HYPERTENSION  ARTERIOVENOUS MALFORMATION  Praesophageal hiatal hernia - giant, irreducible  Acute respiratory failure  CHF (congestive heart failure)  Hypokalemia  Confusion  Bilateral effusion - tapped Mild/mod confusion - CT negative  Plan: -sundowning precautions -MS improving - follow. -low K - replace.  Consider checking MAG level also -try tramadol for breakthrough pain (not needed now).  Use tylenol/ice/heat 1st -ICU per IM team - ? Transfer to floor -pulmonary toilet -Pureed diet as tolerated.  NO solids since just had fundopliaction.  -UGI if c/o chest pain/abd pain but no ev of leak/abscess -crush meds -bowel regimen -VTE prophylaxis- SCDs, etc -mobilize as tolerated to help recovery  Ardeth Sportsman, M.D., F.A.C.S. Gastrointestinal and Minimally Invasive Surgery [facility] [address], [address] [place], [place] [address] [phone] Main / Paging [phone] Voice Mail   01/19/2012

## 2012-01-19 NOTE — Progress Notes (Signed)
Edwards paged regarding pt's potassium of 2.7. Awaiting reply.

## 2012-01-19 NOTE — Progress Notes (Signed)
Patient ID: Brooke [name], female   DOB: February 01, 1932, 78 y.o.   MRN: [id number]  Subjective: No events overnight. Patient denies chest pain, shortness of breath, abdominal pain.   Objective:  Vital signs in last 24 hours:  Filed Vitals:   01/19/12 0930 01/19/12 1040 01/19/12 1200 01/19/12 1425  BP:  130/52 117/64 139/82  Pulse: 96 86 77 80  Temp:   97.9 F (36.6 C) 98.1 F (36.7 C)  TempSrc:   Oral Oral  Resp:  20 21 20   Height:    5\' 1"  (1.549 m)  Weight:    69.582 kg (153 lb 6.4 oz)  SpO2: 95% 95% 95% 94%    Intake/Output from previous day:   Intake/Output Summary (Last 24 hours) at 01/19/12 1716 Last data filed at 01/19/12 1300  Gross per 24 hour  Intake 1834.85 ml  Output   1525 ml  Net 309.85 ml    Physical Exam: General: Alert, awake, oriented x3, in no acute distress. HEENT: No bruits, no goiter. Moist mucous membranes, no scleral icterus, no conjunctival pallor. Heart: Regular rate and rhythm, S1/S2 +, no murmurs, rubs, gallops. Lungs: Clear to auscultation bilaterally but somewhat decreased sounds at bases. No wheezing, no rhonchi, no rales.  Abdomen: Soft, nontender, nondistended, positive bowel sounds. Extremities: No clubbing or cyanosis, no pitting edema,  positive pedal pulses. Neuro: Grossly nonfocal.  Lab Results:  Basic Metabolic Panel:    Component Value Date/Time   NA 136 01/19/2012 0315   K 2.7* 01/19/2012 0315   CL 96 01/19/2012 0315   CO2 30 01/19/2012 0315   BUN 13 01/19/2012 0315   CREATININE 1.05 01/19/2012 0315   GLUCOSE 105* 01/19/2012 0315   CALCIUM 7.4* 01/19/2012 0315   CBC:    Component Value Date/Time   WBC 12.1* 01/19/2012 0315   HGB 9.0* 01/19/2012 0315   HCT 27.4* 01/19/2012 0315   PLT 270 01/19/2012 0315   MCV 78.1 01/19/2012 0315   NEUTROABS 9.1* 01/19/2012 0315   LYMPHSABS 1.6 01/19/2012 0315   MONOABS 1.3* 01/19/2012 0315   EOSABS 0.1 01/19/2012 0315   BASOSABS 0.0 01/19/2012 0315      Lab 01/19/12 0315 01/18/12 0301  01/17/12 0733 01/17/12 0115 01/14/12 0344  WBC 12.1* 15.7* 17.3* 17.7* 10.4  HGB 9.0* 8.9* 9.1* 9.2* 10.3*  HCT 27.4* 28.3* 27.9* 27.9* 32.3*  PLT 270 272 278 279 238  MCV 78.1 78.0 78.2 77.7* 79.2  MCH 25.6* 24.5* 25.5* 25.6* 25.2*  MCHC 32.8 31.4 32.6 33.0 31.9  RDW 16.2* 16.0* 15.9* 15.6* 15.2  LYMPHSABS 1.6 1.3 -- 0.9 --  MONOABS 1.3* 1.5* -- 1.4* --  EOSABS 0.1 0.0 -- 0.0 --  BASOSABS 0.0 0.0 -- 0.0 --  BANDABS -- -- -- -- --    Lab 01/19/12 0315 01/18/12 0301 01/17/12 1548 01/17/12 0733 01/17/12 0115 01/14/12 0344  NA 136 133* 133* -- 128* --  K 2.7* 3.1* 3.2* -- 2.9* 3.8  CL 96 93* 90* -- 91* --  CO2 30 30 31  -- 27 --  GLUCOSE 105* 113* 130* -- 139* --  BUN 13 9 6  -- 10 --  CREATININE 1.05 0.79 0.75 0.69 0.61 --  CALCIUM 7.4* 7.6* 7.7* -- 8.4 --  MG 2.02.0 2.0 -- 1.9 -- --   No results found for this basename: INR:5,PROTIME:5 in the last 168 hours Cardiac markers:  Lab 01/17/12 1548 01/17/12 0733 01/17/12 0115  CKMB 2.8 4.1* 4.9*  TROPONINI <0.30 <0.30 <0.30  MYOGLOBIN -- -- --  No components found with this basename: POCBNP:3 Recent Results (from the past 240 hour(s))  CULTURE, BLOOD (ROUTINE X 2)     Status: Normal (Preliminary result)   Collection Time   01/17/12  2:00 AM      Component Value Range Status Comment   Specimen Description BLOOD RIGHT HAND   Final    Special Requests BOTTLES DRAWN AEROBIC AND ANAEROBIC 5 CC EA   Final    Setup Time [id number]   Final    Culture     Final    Value:        BLOOD CULTURE RECEIVED NO GROWTH TO DATE CULTURE WILL BE HELD FOR 5 DAYS BEFORE ISSUING A FINAL NEGATIVE REPORT   Report Status PENDING   Incomplete   CULTURE, BLOOD (ROUTINE X 2)     Status: Normal (Preliminary result)   Collection Time   01/17/12  2:20 AM      Component Value Range Status Comment   Specimen Description BLOOD RIGHT FOREARM   Final    Special Requests BOTTLES DRAWN AEROBIC AND ANAEROBIC 5 CC EA   Final    Setup Time [id number]   Final     Culture     Final    Value:        BLOOD CULTURE RECEIVED NO GROWTH TO DATE CULTURE WILL BE HELD FOR 5 DAYS BEFORE ISSUING A FINAL NEGATIVE REPORT   Report Status PENDING   Incomplete   MRSA PCR SCREENING     Status: Normal   Collection Time   01/17/12  7:24 AM      Component Value Range Status Comment   MRSA by PCR NEGATIVE  NEGATIVE  Final   BODY FLUID CULTURE     Status: Normal (Preliminary result)   Collection Time   01/17/12  2:21 PM      Component Value Range Status Comment   Specimen Description THORACENTESIS   Final    Special Requests NONE   Final    Gram Stain     Final    Value: FEW WBC PRESENT, PREDOMINANTLY MONONUCLEAR     NO ORGANISMS SEEN   Culture NO GROWTH 1 DAY   Final    Report Status PENDING   Incomplete   PATHOLOGIST SMEAR REVIEW     Status: Normal   Collection Time   01/17/12  2:21 PM      Component Value Range Status Comment   Tech Review Reviewed By Havery Moros, M.D.   Final     Studies/Results: Ct Head Wo Contrast  01/18/2012   IMPRESSION: No intracranial hemorrhage or CT evidence of large acute infarct.  Small vessel disease type changes.  Mild atrophy.    Dg Chest Port 1 View  01/18/2012   IMPRESSION: No significant change in suboptimal aeration with basilar atelectasis and probable effusions left greater than left.    Medications: Scheduled Meds:   . perphenazine  4 mg Oral QHS   And  . amitriptyline  20 mg Oral QHS  . enoxaparin  40 mg Subcutaneous Q24H  . furosemide  40 mg Intravenous Q12H  . metoprolol tartrate  25 mg Oral BID  . piperacillin-tazobactam (ZOSYN)  IV  3.375 g Intravenous Q8H  . potassium chloride  10 mEq Intravenous Q1 Hr x 3  . psyllium  1 packet Oral BID  . sodium chloride  500 mL Intravenous Once  . vancomycin  500 mg Intravenous Q12H  . DISCONTD: antiseptic oral rinse  15 mL  Mouth Rinse q12n4p  . DISCONTD: chlorhexidine  15 mL Mouth Rinse BID  . DISCONTD: Flora-Q  1 capsule Oral Daily  . DISCONTD: metoprolol  succinate  50 mg Oral QPC breakfast  . DISCONTD: metoprolol succinate  50 mg Oral QPC breakfast   Continuous Infusions:   . sodium chloride 10 mL/hr (01/18/12 2200)   PRN Meds:.albuterol, alum & mag hydroxide-simeth, bisacodyl, HYDROcodone-acetaminophen, ipratropium, magnesium hydroxide, ondansetron (ZOFRAN) IV, ondansetron, sodium chloride  Assessment/Plan:  Principal Problem:  *Pleural effusion, bilateral - breath sounds are decreased at bases but pt is clinically stable - she is maintaining oxygen saturations above 92% ON 2L [place] - continue to monitor clinically  ? HCAP - continue broad spectrum antibiotics for now and narrow once blood cultures are back  Active Problems:  ANEMIA, IRON DEFICIENCY - Hg/Hct remain stable and at pt's baseline   HYPERTENSION - remains stable   CHF (congestive heart failure) - clinically stable   Hypokalemia - continue to supplement as indicated - magnesium levels are within normal limits   EDUCATION - test results and diagnostic studies were discussed with patient - patient verbalized the understanding - questions were answered at the bedside and contact information was provided for additional questions or concerns   LOS: 2 days   MAGICK-Quintez Maselli 01/19/2012, 5:16 PM  [facility] Pager: [phone]

## 2012-01-20 LAB — BASIC METABOLIC PANEL
BUN: 10 mg/dL (ref 6–23)
CO2: 29 mEq/L (ref 19–32)
CO2: 29 mEq/L (ref 19–32)
Chloride: 94 mEq/L — ABNORMAL LOW (ref 96–112)
Chloride: 96 mEq/L (ref 96–112)
GFR calc non Af Amer: 55 mL/min — ABNORMAL LOW (ref >90)
GFR calc non Af Amer: 50 mL/min — ABNORMAL LOW (ref >90)
Glucose, Bld: 111 mg/dL — ABNORMAL HIGH (ref 70–99)
Glucose, Bld: 171 mg/dL — ABNORMAL HIGH (ref 70–99)
Potassium: 2.6 mEq/L — CL (ref 3.5–5.1)
Potassium: 3 mEq/L — ABNORMAL LOW (ref 3.5–5.1)
Sodium: 134 mEq/L — ABNORMAL LOW (ref 135–145)
Sodium: 136 mEq/L (ref 135–145)

## 2012-01-20 LAB — DIFFERENTIAL
Basophils Absolute: 0 10*3/uL (ref 0.0–0.1)
Lymphocytes Relative: 13 % (ref 12–46)
Monocytes Relative: 11 % (ref 3–12)

## 2012-01-20 LAB — CBC
Platelets: 330 10*3/uL (ref 150–400)
RDW: 16.3 % — ABNORMAL HIGH (ref 11.5–15.5)
WBC: 13.3 10*3/uL — ABNORMAL HIGH (ref 4.0–10.5)

## 2012-01-20 MED ORDER — MOXIFLOXACIN HCL 400 MG PO TABS
400.0000 mg | ORAL_TABLET | Freq: Every day | ORAL | Status: DC
  Administered 2012-01-20: 400 mg via ORAL
  Filled 2012-01-20 (×2): qty 1

## 2012-01-20 MED ORDER — POTASSIUM CHLORIDE 10 MEQ/100ML IV SOLN
10.0000 meq | INTRAVENOUS | Status: AC
  Administered 2012-01-20 (×2): 10 meq via INTRAVENOUS
  Filled 2012-01-20 (×2): qty 100

## 2012-01-20 MED ORDER — POTASSIUM CHLORIDE 10 MEQ/100ML IV SOLN
10.0000 meq | INTRAVENOUS | Status: AC
  Administered 2012-01-20 (×6): 10 meq via INTRAVENOUS
  Filled 2012-01-20 (×6): qty 100

## 2012-01-20 MED ORDER — FUROSEMIDE 40 MG PO TABS
40.0000 mg | ORAL_TABLET | Freq: Two times a day (BID) | ORAL | Status: DC
  Administered 2012-01-21: 40 mg via ORAL
  Filled 2012-01-20 (×4): qty 1

## 2012-01-20 NOTE — Progress Notes (Signed)
Patient ID: Brooke [name], female   DOB: Jan 20, 1932, 78 y.o.   MRN: [id number]  Subjective: No events overnight. Patient denies chest pain, shortness of breath, abdominal pain. Had bowel movement and reports ambulating.  Objective:  Vital signs in last 24 hours:  Filed Vitals:   01/19/12 2105 01/20/12 0545 01/20/12 0954 01/20/12 1616  BP: 125/63 122/57 127/74 135/77  Pulse: 78 74 90 79  Temp: 98.1 F (36.7 C) 98.8 F (37.1 C)  98 F (36.7 C)  TempSrc: Oral Oral  Oral  Resp: 20 20  20   Height:      Weight:      SpO2: 92% 90%  94%    Intake/Output from previous day:   Intake/Output Summary (Last 24 hours) at 01/20/12 1650 Last data filed at 01/20/12 1617  Gross per 24 hour  Intake 269.83 ml  Output    403 ml  Net -133.17 ml    Physical Exam: General: Alert, awake, oriented x3, in no acute distress. HEENT: No bruits, no goiter. Moist mucous membranes, no scleral icterus, no conjunctival pallor. Heart: Regular rate and rhythm, S1/S2 +, no murmurs, rubs, gallops. Lungs: Clear to auscultation bilaterally with minimal bibasilar crackles. No wheezing, no rhonchi, no rales.  Abdomen: Soft, nontender, nondistended, positive bowel sounds. Extremities: No clubbing or cyanosis, no pitting edema,  positive pedal pulses. Neuro: Grossly nonfocal.  Lab Results:  Basic Metabolic Panel:    Component Value Date/Time   NA 136 01/20/2012 1328   K 3.0* 01/20/2012 1328   CL 96 01/20/2012 1328   CO2 29 01/20/2012 1328   BUN 11 01/20/2012 1328   CREATININE 1.05 01/20/2012 1328   GLUCOSE 171* 01/20/2012 1328   CALCIUM 8.2* 01/20/2012 1328   CBC:    Component Value Date/Time   WBC 13.3* 01/20/2012 0438   HGB 9.5* 01/20/2012 0438   HCT 29.3* 01/20/2012 0438   PLT 330 01/20/2012 0438   MCV 77.7* 01/20/2012 0438   NEUTROABS 10.0* 01/20/2012 0438   LYMPHSABS 1.7 01/20/2012 0438   MONOABS 1.5* 01/20/2012 0438   EOSABS 0.1 01/20/2012 0438   BASOSABS 0.0 01/20/2012 0438      Lab 01/20/12 0438  01/19/12 0315 01/18/12 0301 01/17/12 0733 01/17/12 0115  WBC 13.3* 12.1* 15.7* 17.3* 17.7*  HGB 9.5* 9.0* 8.9* 9.1* 9.2*  HCT 29.3* 27.4* 28.3* 27.9* 27.9*  PLT 330 270 272 278 279  MCV 77.7* 78.1 78.0 78.2 77.7*  MCH 25.2* 25.6* 24.5* 25.5* 25.6*  MCHC 32.4 32.8 31.4 32.6 33.0  RDW 16.3* 16.2* 16.0* 15.9* 15.6*  LYMPHSABS 1.7 1.6 1.3 -- 0.9  MONOABS 1.5* 1.3* 1.5* -- 1.4*  EOSABS 0.1 0.1 0.0 -- 0.0  BASOSABS 0.0 0.0 0.0 -- 0.0  BANDABS -- -- -- -- --    Lab 01/20/12 1328 01/20/12 0438 01/19/12 0315 01/18/12 0301 01/17/12 1548 01/17/12 0733  NA 136 134* 136 133* 133* --  K 3.0* 2.6* 2.7* 3.1* 3.2* --  CL 96 94* 96 93* 90* --  CO2 29 29 30 30 31  --  GLUCOSE 171* 111* 105* 113* 130* --  BUN 11 10 13 9 6  --  CREATININE 1.05 0.97 1.05 0.79 0.75 --  CALCIUM 8.2* 8.1* 7.4* 7.6* 7.7* --  MG -- 2.0 2.02.0 2.0 -- 1.9   No results found for this basename: INR:5,PROTIME:5 in the last 168 hours Cardiac markers:  Lab 01/17/12 1548 01/17/12 0733 01/17/12 0115  CKMB 2.8 4.1* 4.9*  TROPONINI <0.30 <0.30 <0.30  MYOGLOBIN -- -- --  No components found with this basename: POCBNP:3 Recent Results (from the past 240 hour(s))  CULTURE, BLOOD (ROUTINE X 2)     Status: Normal (Preliminary result)   Collection Time   01/17/12  2:00 AM      Component Value Range Status Comment   Specimen Description BLOOD RIGHT HAND   Final    Special Requests BOTTLES DRAWN AEROBIC AND ANAEROBIC 5 CC EA   Final    Setup Time [id number]   Final    Culture     Final    Value:        BLOOD CULTURE RECEIVED NO GROWTH TO DATE CULTURE WILL BE HELD FOR 5 DAYS BEFORE ISSUING A FINAL NEGATIVE REPORT   Report Status PENDING   Incomplete   CULTURE, BLOOD (ROUTINE X 2)     Status: Normal (Preliminary result)   Collection Time   01/17/12  2:20 AM      Component Value Range Status Comment   Specimen Description BLOOD RIGHT FOREARM   Final    Special Requests BOTTLES DRAWN AEROBIC AND ANAEROBIC 5 CC EA   Final    Setup  Time [id number]   Final    Culture     Final    Value:        BLOOD CULTURE RECEIVED NO GROWTH TO DATE CULTURE WILL BE HELD FOR 5 DAYS BEFORE ISSUING A FINAL NEGATIVE REPORT   Report Status PENDING   Incomplete   MRSA PCR SCREENING     Status: Normal   Collection Time   01/17/12  7:24 AM      Component Value Range Status Comment   MRSA by PCR NEGATIVE  NEGATIVE  Final   BODY FLUID CULTURE     Status: Normal (Preliminary result)   Collection Time   01/17/12  2:21 PM      Component Value Range Status Comment   Specimen Description THORACENTESIS   Final    Special Requests NONE   Final    Gram Stain     Final    Value: FEW WBC PRESENT, PREDOMINANTLY MONONUCLEAR     NO ORGANISMS SEEN   Culture NO GROWTH 2 DAYS   Final    Report Status PENDING   Incomplete   PATHOLOGIST SMEAR REVIEW     Status: Normal   Collection Time   01/17/12  2:21 PM      Component Value Range Status Comment   Tech Review Reviewed By Havery Moros, M.D.   Final     Studies/Results: No results found.  Medications: Scheduled Meds:   . perphenazine  4 mg Oral QHS   And  . amitriptyline  20 mg Oral QHS  . enoxaparin  40 mg Subcutaneous Q24H  . furosemide  40 mg Intravenous Q12H  . metoprolol tartrate  25 mg Oral BID  . moxifloxacin  400 mg Oral q1800  . potassium chloride  10 mEq Intravenous Q1 Hr x 2  . potassium chloride  10 mEq Intravenous Q1 Hr x 6  . psyllium  1 packet Oral BID  . DISCONTD: piperacillin-tazobactam (ZOSYN)  IV  3.375 g Intravenous Q8H  . DISCONTD: vancomycin  500 mg Intravenous Q12H  . DISCONTD: vancomycin  750 mg Intravenous Q12H   Continuous Infusions:   . sodium chloride 10 mL/hr (01/18/12 2200)   PRN Meds:.albuterol, alum & mag hydroxide-simeth, bisacodyl, HYDROcodone-acetaminophen, ipratropium, magnesium hydroxide, ondansetron (ZOFRAN) IV, ondansetron, sodium chloride  Assessment/Plan:  Principal Problem:  *Pleural effusion, bilateral  -  breath sounds are decreased at  bases but pt is clinically stable  - she is maintaining oxygen saturations above 92% ON 2L [place]  - continue to monitor clinically   ? HCAP  - d/c IV abx and change to PO (AVELOX)  Active Problems:  ANEMIA, IRON DEFICIENCY  - Hg/Hct remain stable and at pt's baseline   HYPERTENSION  - remains stable   CHF (congestive heart failure)  - clinically stable  - change Lasix to PO  Hypokalemia  - continue to supplement as indicated  - magnesium levels are within normal limits    EDUCATION - test results and diagnostic studies were discussed with patient and pt's family who was present at the bedside - patient and family have verbalized the understanding - questions were answered at the bedside and contact information was provided for additional questions or concerns   LOS: 3 days   MAGICK-Jenny Omdahl 01/20/2012, 4:50 PM  [facility] Pager: [phone]

## 2012-01-20 NOTE — Progress Notes (Signed)
CRITICAL VALUE ALERT  Critical value received:  Potassium 2.6  Date of notification:  01/20/12  Time of notification:  0610  Critical value read back:yes  Nurse who received alert:  Mathis Fare  MD notified (1st page): np michelle edwards Time of first page:  0612  MD notified (2nd page):  Time of second page:  Responding MD:  np michelle edwards  Time MD responded:  [phone]

## 2012-01-20 NOTE — Progress Notes (Signed)
Brooke [name] 1932-05-05 [id number]  PCP: Cam Hai, CNM, CNM Outpatient Care Team: Patient Care Team: Cam Hai, CNM as PCP - General (Obstetrics and Gynecology) Eliezer Bottom., MD,FACG as Consulting Physician (Gastroenterology)  Inpatient Treatment Team: Treatment Team: Attending Provider: Debbora Presto, MD; Technician: Heloise Ochoa, EMT; Rounding Team: Massie Kluver, MD; Consulting Physician: Ardeth Sportsman, MD; Registered Nurse: Doylene Canard, RN; Respiratory Therapist: Fontaine No, RRT; Registered Nurse: Kem Boroughs, RN; Physician Assistant: Luberta Robertson, NT; Attending Physician: Debbora Presto, MD; Registered Nurse: Willow Ora, RN; Technician: Rayfield Citizen, NT; Registered Nurse: Junius Creamer, RN; Technician: Everitt Amber, NT; Physical Therapist: Gershon Mussel, PT  Subjective: Pt feeling well Husband in room No chest/abd pain Tol pureed diet No N/V  Objective:  Vital signs:  Temp:  [98.1 F (36.7 C)-98.8 F (37.1 C)] 98.8 F (37.1 C) (01/24 0545) Pulse Rate:  [74-90] 90  (01/24 0954) Resp:  [20] 20  (01/24 0545) BP: (122-139)/(57-82) 127/74 mmHg (01/24 0954) SpO2:  [90 %-94 %] 90 % (01/24 0545) Weight:  [153 lb 6.4 oz (69.582 kg)] 153 lb 6.4 oz (69.582 kg) (01/23 1425) Last BM Date: 01/20/12  Intake/Output   Yesterday:  01/23 0701 - 01/24 0700 In: 894.7 [P.O.:480; I.V.:189.8; IV Piggyback:224.9] Out: 626 [Urine:626] This shift:  Total I/O In: 120 [P.O.:120] Out: 2 [Urine:2]  Bowel function:  Flatus: Yes  BM: Yes, loose 3-4/day  Physical Exam:  General: Pt awake, alert, smiling, oriented x 4 Psych:  No psychosis/paranoia.  Not confused to me. HENT: Normocephalic, Mucus membranes moist.  No thrush Chest: Dec BS at bases No chest wall pain w good excursion Abdomen: Soft, Obese.  Nontender/Nondistended.  No incarcerated hernias.  Incisions c/d/i Ext:  SCDs BLE.  No mjr  edema.  No cyanosis.  NO hematoma/edema at R wrist IV site.  MIld TTP Skin: No petechiae / purpurae  Results:   Labs: Results for orders placed during the hospital encounter of 01/17/12 (from the past 48 hour(s))  DIFFERENTIAL     Status: Abnormal   Collection Time   01/19/12  3:15 AM      Component Value Range Comment   Neutrophils Relative 75  43 - 77 (%)    Lymphocytes Relative 13  12 - 46 (%)    Monocytes Relative 11  3 - 12 (%)    Eosinophils Relative 1  0 - 5 (%)    Basophils Relative 0  0 - 1 (%)    Neutro Abs 9.1 (*) 1.7 - 7.7 (K/uL)    Lymphs Abs 1.6  0.7 - 4.0 (K/uL)    Monocytes Absolute 1.3 (*) 0.1 - 1.0 (K/uL)    Eosinophils Absolute 0.1  0.0 - 0.7 (K/uL)    Basophils Absolute 0.0  0.0 - 0.1 (K/uL)    WBC Morphology TOXIC GRANULATION     CBC     Status: Abnormal   Collection Time   01/19/12  3:15 AM      Component Value Range Comment   WBC 12.1 (*) 4.0 - 10.5 (K/uL)    RBC 3.51 (*) 3.87 - 5.11 (MIL/uL)    Hemoglobin 9.0 (*) 12.0 - 15.0 (g/dL)    HCT 29.5 (*) 62.1 - 46.0 (%)    MCV 78.1  78.0 - 100.0 (fL)    MCH 25.6 (*) 26.0 - 34.0 (pg)    MCHC 32.8  30.0 - 36.0 (g/dL)    RDW 30.8 (*) 65.7 -  15.5 (%)    Platelets 270  150 - 400 (K/uL)   BASIC METABOLIC PANEL     Status: Abnormal   Collection Time   01/19/12  3:15 AM      Component Value Range Comment   Sodium 136  135 - 145 (mEq/L)    Potassium 2.7 (*) 3.5 - 5.1 (mEq/L)    Chloride 96  96 - 112 (mEq/L)    CO2 30  19 - 32 (mEq/L)    Glucose, Bld 105 (*) 70 - 99 (mg/dL)    BUN 13  6 - 23 (mg/dL)    Creatinine, Ser 0.45  0.50 - 1.10 (mg/dL)    Calcium 7.4 (*) 8.4 - 10.5 (mg/dL)    GFR calc non Af Amer 50 (*) >90 (mL/min)    GFR calc Af Amer 58 (*) >90 (mL/min)   MAGNESIUM     Status: Normal   Collection Time   01/19/12  3:15 AM      Component Value Range Comment   Magnesium 2.0  1.5 - 2.5 (mg/dL)   PRO B NATRIURETIC PEPTIDE     Status: Abnormal   Collection Time   01/19/12  3:15 AM      Component Value  Range Comment   Pro B Natriuretic peptide (BNP) 575.9 (*) 0 - 450 (pg/mL)   MAGNESIUM     Status: Normal   Collection Time   01/19/12  3:15 AM      Component Value Range Comment   Magnesium 2.0  1.5 - 2.5 (mg/dL)   VANCOMYCIN, TROUGH     Status: Normal   Collection Time   01/19/12  4:35 PM      Component Value Range Comment   Vancomycin Tr 11.3  10.0 - 20.0 (ug/mL)   DIFFERENTIAL     Status: Abnormal   Collection Time   01/20/12  4:38 AM      Component Value Range Comment   Neutrophils Relative 75  43 - 77 (%)    Lymphocytes Relative 13  12 - 46 (%)    Monocytes Relative 11  3 - 12 (%)    Eosinophils Relative 1  0 - 5 (%)    Basophils Relative 0  0 - 1 (%)    Neutro Abs 10.0 (*) 1.7 - 7.7 (K/uL)    Lymphs Abs 1.7  0.7 - 4.0 (K/uL)    Monocytes Absolute 1.5 (*) 0.1 - 1.0 (K/uL)    Eosinophils Absolute 0.1  0.0 - 0.7 (K/uL)    Basophils Absolute 0.0  0.0 - 0.1 (K/uL)    RBC Morphology RARE NRBCs      WBC Morphology MILD LEFT SHIFT (1-5% METAS, OCC MYELO, OCC BANDS)      Smear Review PLATELET COUNT CONFIRMED BY SMEAR   PLATELET CLUMPS NOTED ON SMEAR  CBC     Status: Abnormal   Collection Time   01/20/12  4:38 AM      Component Value Range Comment   WBC 13.3 (*) 4.0 - 10.5 (K/uL)    RBC 3.77 (*) 3.87 - 5.11 (MIL/uL)    Hemoglobin 9.5 (*) 12.0 - 15.0 (g/dL)    HCT 40.9 (*) 81.1 - 46.0 (%)    MCV 77.7 (*) 78.0 - 100.0 (fL)    MCH 25.2 (*) 26.0 - 34.0 (pg)    MCHC 32.4  30.0 - 36.0 (g/dL)    RDW 91.4 (*) 78.2 - 15.5 (%)    Platelets 330  150 - 400 (K/uL)  BASIC METABOLIC PANEL     Status: Abnormal   Collection Time   01/20/12  4:38 AM      Component Value Range Comment   Sodium 134 (*) 135 - 145 (mEq/L)    Potassium 2.6 (*) 3.5 - 5.1 (mEq/L)    Chloride 94 (*) 96 - 112 (mEq/L)    CO2 29  19 - 32 (mEq/L)    Glucose, Bld 111 (*) 70 - 99 (mg/dL)    BUN 10  6 - 23 (mg/dL)    Creatinine, Ser 1.47  0.50 - 1.10 (mg/dL)    Calcium 8.1 (*) 8.4 - 10.5 (mg/dL)    GFR calc non Af Amer  55 (*) >90 (mL/min)    GFR calc Af Amer 64 (*) >90 (mL/min)   MAGNESIUM     Status: Normal   Collection Time   01/20/12  4:38 AM      Component Value Range Comment   Magnesium 2.0  1.5 - 2.5 (mg/dL)     Imaging / Studies: No results found.  Medications / Allergies: per chart  Antibiotics: Anti-infectives     Start     Dose/Rate Route Frequency Ordered Stop   01/20/12 1800   moxifloxacin (AVELOX) tablet 400 mg        400 mg Oral Daily-1800 01/20/12 1158     01/20/12 0400   vancomycin (VANCOCIN) 750 mg in sodium chloride 0.9 % 150 mL IVPB  Status:  Discontinued        750 mg 150 mL/hr over 60 Minutes Intravenous Every 12 hours 01/19/12 1830 01/20/12 1158   01/17/12 1700   vancomycin (VANCOCIN) 500 mg in sodium chloride 0.9 % 100 mL IVPB  Status:  Discontinued        500 mg 100 mL/hr over 60 Minutes Intravenous Every 12 hours 01/17/12 0651 01/19/12 1830   01/17/12 1300   piperacillin-tazobactam (ZOSYN) IVPB 3.375 g  Status:  Discontinued        3.375 g 12.5 mL/hr over 240 Minutes Intravenous Every 8 hours 01/17/12 0651 01/20/12 1158   01/17/12 0415   vancomycin (VANCOCIN) injection 1 g  Status:  Discontinued        1 g Intravenous  Once 01/17/12 0350 01/17/12 0401   01/17/12 0415  piperacillin-tazobactam (ZOSYN) IVPB 4.5 g       4.5 g 200 mL/hr over 30 Minutes Intravenous  Once 01/17/12 0350 01/17/12 0452   01/17/12 0415   vancomycin (VANCOCIN) IVPB 1000 mg/200 mL premix        1,000 mg 200 mL/hr over 60 Minutes Intravenous To Emergency Dept 01/17/12 0401 01/17/12 0604   01/17/12 0145   azithromycin (ZITHROMAX) 500 mg in dextrose 5 % 250 mL IVPB        500 mg 250 mL/hr over 60 Minutes Intravenous  Once 01/17/12 0141 01/17/12 0410   01/17/12 0145   cefTRIAXone (ROCEPHIN) 1 g in dextrose 5 % 50 mL IVPB        1 g 100 mL/hr over 30 Minutes Intravenous  Once 01/17/12 0141 01/17/12 0243          Assessment  Arletta Bale  78 y.o. female       Problem  List:  Principal Problem:  *Pleural effusion, bilateral Active Problems:  ANEMIA, IRON DEFICIENCY  HYPERTENSION  ARTERIOVENOUS MALFORMATION  Praesophageal hiatal hernia - giant, irreducible  Acute respiratory failure  CHF (congestive heart failure)  Hypokalemia  Confusion  Bilateral effusion - tapped & diuresing POD#7 s/p  lap PEH repair w Nissen  Plan: -MS improved - follow. -low K - replacing Mag OK -try tramadol for breakthrough pain (not needed now).  Use tylenol/ice/heat 1st  -Pureed diet as tolerated.  NO solids since just had fundopliaction.   -UGI if c/o chest pain/abd pain but no ev of leak/abscess -crush large meds ~ 1 more week until dysphagia down.  Can probably split pills  -bowel regimen -VTE prophylaxis- SCDs, etc -mobilize as tolerated to help recovery -RTC ~2weeks -will sign off, Call w ?s  Pt already has D/C instructions written  Ardeth Sportsman, M.D., F.A.C.S. Gastrointestinal [facility]ally [facility] [address] [place], [place] [address] [phone] Main / Paging [phone] Voice Mail   01/20/2012

## 2012-01-20 NOTE — Progress Notes (Signed)
Nutrition dx:  Nutrition-related knowledge deficit r/t diet therapy AEB MD/nursing request  Intervention:  Brief education;  Provided.  Goals of nutrition therapy discussed with pt.  Pt feels she understands diet and how to comply.  She has the necessary utensils at home to prepare foods.  Understanding confirmed.  Pt also asks about high potassium foods as she is to continue lasix.  List provided.  Pt denies additional questions at this time.  Monitoring:  Knowledge; for questions.  Please consult RD if new questions present prior to discharge.  Pager:  [phone]

## 2012-01-20 NOTE — Progress Notes (Signed)
Physical Therapy Note  Attempted PT tx session. Pt stated she will ambulate with husband later today. Pt states she feels she is mobilizing well. PT will sign off at this time. Thanks.

## 2012-01-20 NOTE — Discharge Summary (Signed)
Physician Discharge Summary  Patient ID: Brooke [name] MRN: [id number] DOB/AGE: [birth year] 76 y.o.  Admit date: 01/13/2012 Discharge date: 01/15/2012  Admission Diagnoses: Principal Problem:  *Praesophageal hiatal hernia - giant, irreducible Active Problems:  HYPERTENSION  GERD  Discharge Diagnoses:  Principal Problem:  *Praesophageal hiatal hernia - giant, irreducible Active Problems:  HYPERTENSION  GERD   Discharged Condition: good  Hospital Course: Pt underwent PEH reduction & repair w fundoplication.  Pain was controlled.  UGI confirmed good wrap placement w/o leak nor major obstruction.    At the time of D/C, she was walking well in hallways, belching, +flatus, tolerating liquids/pureed diet, and had minimal pain.  Therefore, we felt the pt could be D/C'd home  Consults: None  Significant Diagnostic Studies: radiology: UGI no leak/abscess  01/14/2012 *RADIOLOGY REPORT* Clinical Data: Para esophageal hernia status post Nissen fundoplication. UPPER GI SERIES WITH KUB Technique: Routine upper GI series was performed with 50 ml water- soluble contrast (Omnipaque-300). Fluoroscopy Time: 1.3 minutes Comparison: Upper GI series 09/17/2011 and abdominal CT 04/19/2011. Findings: [facility] abdominal radiograph demonstrates a surgical drain overlying the upper abdomen. The bowel gas pattern is normal. Diffuse lumbar spondylosis is noted. On ingestion of water soluble contrast, mild esophageal spasm is noted. The stomach is now repositioned beneath the diaphragm. There is expected extrinsic mass effect on the gastroesophageal junction consistent with Nissen fundoplication. There is no esophageal obstruction or extravasation. The proximal stomach is incompletely distended. Allowing for this, there is no definite mucosal abnormality. There is no evidence of gastric outlet obstruction. The proximal small bowel appears normal. IMPRESSION: 1. No evidence of leak or obstruction status post repair of  para esophageal hernia and Nissen fundoplication. 2. Mild esophageal spasm. Original Report Authenticated By: Gerrianne Scale, M.D.    Treatments: surgery: Lap PEH repair / Nissen fundoplication  Discharge Exam: Blood pressure 132/66, pulse 88, temperature 98.6 F (37 C), temperature source Oral, resp. rate 17, height 5\' 1"  (1.549 m), weight 154 lb (69.854 kg), SpO2 90.00%.  General: Pt awake/alert/oriented x4 in no major acute distress Eyes: PERRL, normal EOM. Neuro: CN II-XII intact w/o focal sensory/motor deficits. Lymph: No head/neck/groin lymphadenopathy Psych:  No delerium/psychosis/paranoia HEENT: Normocephalic, Mucus membranes moist.  No thrush Neck: Supple, No tracheal deviation Chest: Min discomfort w good excursion CV:  Pulses intact.  Regular rhythm Abdomen: soft, min tender at incisions.  Obese/nondistended.  No incarcerated hernias. Ext:  SCDs BLE.  No mjr edema.  No cyanosis Skin: No petechiae / purpurae    Disposition: Home or Self Care   Medication List  As of 01/20/2012 12:41 PM   STOP taking these medications         omeprazole 20 MG capsule         TAKE these medications         hydrochlorothiazide 25 MG tablet   Commonly known as: HYDRODIURIL   Take 12.5 mg by mouth daily after breakfast.      metoprolol succinate 50 MG 24 hr tablet   Commonly known as: TOPROL-XL   Take 50 mg by mouth daily after breakfast.      oxyCODONE 5 MG immediate release tablet   Commonly known as: Oxy IR/ROXICODONE   Take 1-2 tablets (5-10 mg total) by mouth every 4 (four) hours as needed.      perphenazine-amitriptyline 2-10 MG Tabs   Commonly known as: ETRAFON/TRIAVIL   Take 2 tablets by mouth at bedtime.      simvastatin 20 MG tablet  Commonly known as: ZOCOR   Take 20 mg by mouth at bedtime.             Signed: Lyman Balingit C. 01/20/2012, 12:41 PM

## 2012-01-21 LAB — BODY FLUID CULTURE: Culture: NO GROWTH

## 2012-01-21 LAB — CBC
Hemoglobin: 9.1 g/dL — ABNORMAL LOW (ref 12.0–15.0)
MCV: 78.1 fL (ref 78.0–100.0)
Platelets: 352 10*3/uL (ref 150–400)
RBC: 3.61 MIL/uL — ABNORMAL LOW (ref 3.87–5.11)
WBC: 11.7 10*3/uL — ABNORMAL HIGH (ref 4.0–10.5)

## 2012-01-21 LAB — MAGNESIUM: Magnesium: 2 mg/dL (ref 1.5–2.5)

## 2012-01-21 LAB — DIFFERENTIAL
Basophils Absolute: 0 10*3/uL (ref 0.0–0.1)
Eosinophils Relative: 1 % (ref 0–5)
Lymphocytes Relative: 14 % (ref 12–46)
Monocytes Relative: 10 % (ref 3–12)
Neutro Abs: 8.8 10*3/uL — ABNORMAL HIGH (ref 1.7–7.7)

## 2012-01-21 LAB — BASIC METABOLIC PANEL
CO2: 29 mEq/L (ref 19–32)
Chloride: 100 mEq/L (ref 96–112)
Sodium: 137 mEq/L (ref 135–145)

## 2012-01-21 MED ORDER — MOXIFLOXACIN HCL 400 MG PO TABS: 400.0000 mg | ORAL_TABLET | Freq: Every day | ORAL | Status: AC

## 2012-01-21 MED ORDER — FUROSEMIDE 40 MG PO TABS: 20.0000 mg | ORAL_TABLET | Freq: Two times a day (BID) | ORAL | Status: DC

## 2012-01-21 MED ORDER — BISACODYL 10 MG RE SUPP: 10.0000 mg | Freq: Two times a day (BID) | RECTAL | Status: AC | PRN

## 2012-01-21 MED ORDER — HYDROCODONE-ACETAMINOPHEN 5-325 MG PO TABS: 1.0000 | ORAL_TABLET | Freq: Four times a day (QID) | ORAL | Status: AC | PRN

## 2012-01-21 NOTE — Discharge Summary (Signed)
Patient ID: Brooke [name] MRN: [id number] DOB/AGE: 1932-10-17 77 y.o.  Admit date: 01/17/2012 Discharge date: 01/21/2012  Primary Care Physician:  Cam Hai, CNM, CNM  Discharge Diagnoses:    Present on Admission:  .Acute respiratory failure .CHF (congestive heart failure) .Pleural effusion, bilateral .HYPERTENSION .Praesophageal hiatal hernia - giant, irreducible .Hypokalemia .Confusion .ANEMIA, IRON DEFICIENCY  Principal Problem:  *Pleural effusion, bilateral Active Problems:  ANEMIA, IRON DEFICIENCY  HYPERTENSION  ARTERIOVENOUS MALFORMATION  Praesophageal hiatal hernia - giant, irreducible  Acute respiratory failure  CHF (congestive heart failure)  Hypokalemia  Confusion   Medication List  As of 01/21/2012 10:16 AM   TAKE these medications         bisacodyl 10 MG suppository   Commonly known as: DULCOLAX   Place 1 suppository (10 mg total) rectally every 12 (twelve) hours as needed.      furosemide 40 MG tablet   Commonly known as: LASIX   Take 0.5 tablets (20 mg total) by mouth 2 (two) times daily.      hydrochlorothiazide 25 MG tablet   Commonly known as: HYDRODIURIL   Take 12.5 mg by mouth daily after breakfast.      HYDROcodone-acetaminophen 5-325 MG per tablet   Commonly known as: NORCO   Take 1-2 tablets by mouth every 6 (six) hours as needed.      metoprolol succinate 50 MG 24 hr tablet   Commonly known as: TOPROL-XL   Take 50 mg by mouth daily after breakfast.      moxifloxacin 400 MG tablet   Commonly known as: AVELOX   Take 1 tablet (400 mg total) by mouth daily at 6 PM.      oxyCODONE 5 MG immediate release tablet   Commonly known as: Oxy IR/ROXICODONE   Take 1-2 tablets (5-10 mg total) by mouth every 4 (four) hours as needed.      perphenazine-amitriptyline 2-10 MG Tabs   Commonly known as: ETRAFON/TRIAVIL   Take 2 tablets by mouth at bedtime.      simvastatin 20 MG tablet   Commonly known as: ZOCOR   Take 20 mg by mouth at  bedtime.            Disposition and Follow-up: With PCP in 3-4 weeks  Consults:   Significant Diagnostic Studies:  Ct Angio Chest W/cm &/or Wo Cm 01/17/2012    IMPRESSION:  1.  No evidence of central pulmonary embolus. 2.  Moderate bilateral pleural effusions; mild interstitial prominence could reflect mild interstitial edema.  Associated partial consolidation of both lower lung lobes most likely reflects atelectasis, though pneumonia cannot be entirely excluded. 3.  Flattened 5.0 cm pericardial cyst noted.    Dg Chest Port 1 View 01/17/2012  IMPRESSION:  1.  Decrease in volume of right pleural effusion. 2.  No pneumothorax identified.   Dg Chest Portable 1 View 01/17/2012 IMPRESSION: Lungs hypoexpanded; retrocardiac airspace opacification raises concern for pneumonia.  The appearance is less typical for edema; suspect small left pleural effusion.    US Thoracentesis Asp Pleural Space W/img Guide 01/17/2012  IMPRESSION: Successful ultrasound guided right thoracentesis yielding 630 ml of pleural fluid.  Read by: Anselm Pancoast, P.A.-C     Brief H and P:  This is a 78 year old female with no significant illnesses. She was recently admitted and underwent a paraesophageal hernia repair. She tolerated the procedure well. She was admitted Thursday, discharged on Saturday. the patient states she had some mild shortness of breath at discharge, which continued to  progress. She states on Sunday she was quite dyspneic, per her daughter-in-law know patient had significant to work of breathing. There is no complaint of any chest pains, there is some mild palpitations. She did have a cough which is nonproductive and developed some wheezing by Sunday. Sunday evening she sustained a mechanical fall, and was brought to the ER. There is no report of fevers. In the er the patient was quite dyspneic. She was placed on BiPAP . She's feeling better. History provided by the patient and her daughter-in-law who is  at the bedside.   Physical Exam on Discharge:  Filed Vitals:   01/20/12 1616 01/20/12 2132 01/21/12 0536 01/21/12 0951  BP: 135/77 108/61 110/65 141/76  Pulse: 79 83 80 84  Temp: 98 F (36.7 C) 98 F (36.7 C) 97.9 F (36.6 C)   TempSrc: Oral Oral Oral   Resp: 20 18 18    Height:      Weight:   69.25 kg (152 lb 10.7 oz)   SpO2: 94% 94% 95%      Intake/Output Summary (Last 24 hours) at 01/21/12 1016 Last data filed at 01/21/12 0538  Gross per 24 hour  Intake    840 ml  Output    250 ml  Net    590 ml    General: Alert, awake, oriented x3, in no acute distress. HEENT: No bruits, no goiter. Heart: Regular rate and rhythm, without murmurs, rubs, gallops. Lungs: Clear to auscultation bilaterally. Abdomen: Soft, nontender, nondistended, positive bowel sounds. Extremities: No clubbing cyanosis or edema with positive pedal pulses. Neuro: Grossly intact, nonfocal.  CBC:    Component Value Date/Time   WBC 11.7* 01/21/2012 0435   HGB 9.1* 01/21/2012 0435   HCT 28.2* 01/21/2012 0435   PLT 352 01/21/2012 0435   MCV 78.1 01/21/2012 0435   NEUTROABS 8.8* 01/21/2012 0435   LYMPHSABS 1.6 01/21/2012 0435   MONOABS 1.2* 01/21/2012 0435   EOSABS 0.1 01/21/2012 0435   BASOSABS 0.0 01/21/2012 0435    Basic Metabolic Panel:    Component Value Date/Time   NA 137 01/21/2012 0435   K 3.6 01/21/2012 0435   CL 100 01/21/2012 0435   CO2 29 01/21/2012 0435   BUN 9 01/21/2012 0435   CREATININE 1.11* 01/21/2012 0435   GLUCOSE 92 01/21/2012 0435   CALCIUM 8.1* 01/21/2012 0435    Hospital Course:  Principal Problem:  *Pleural effusion, bilateral Active Problems:  ANEMIA, IRON DEFICIENCY  HYPERTENSION  ARTERIOVENOUS MALFORMATION  Praesophageal hiatal hernia - giant, irreducible  Acute respiratory failure  CHF (congestive heart failure)  Hypokalemia  Confusion  Principal Problem:  *Pleural effusion, bilateral  - breath sounds are decreased at bases but pt is clinically stable  - this was  thought to be related to bilateral pleural effusions with hospital acquired PNA - she is maintaining oxygen saturations above 95% ON 2L [place]   ? HCAP  - pt has been on IV antibiotics - will discharge on PO antibiotics  Active Problems:  ANEMIA, IRON DEFICIENCY  - Hg/Hct remained stable and at pt's baseline during the hospitalization  HYPERTENSION  - remained stable   Time spent on Discharge: Over 30 minutes  Signed: Debbora Presto 01/21/2012, 10:16 AM  [phone]

## 2012-01-23 LAB — CULTURE, BLOOD (ROUTINE X 2)
Culture  Setup Time: 201301210926
Culture: NO GROWTH

## 2012-01-28 DIAGNOSIS — K449 Diaphragmatic hernia without obstruction or gangrene: Secondary | ICD-10-CM | POA: Diagnosis not present

## 2012-01-28 DIAGNOSIS — R110 Nausea: Secondary | ICD-10-CM | POA: Diagnosis not present

## 2012-01-31 ENCOUNTER — Telehealth (INDEPENDENT_AMBULATORY_CARE_PROVIDER_SITE_OTHER): Payer: Self-pay | Admitting: Surgery

## 2012-01-31 NOTE — Telephone Encounter (Signed)
Patient had surgery 2wks ago and was told by Dr. Sheilah Pigeon to call and schedule an appointment and 2wks, soonest available is 02/21/12, please call to schedule.

## 2012-02-08 ENCOUNTER — Encounter (INDEPENDENT_AMBULATORY_CARE_PROVIDER_SITE_OTHER): Payer: Medicare Other | Admitting: Surgery

## 2012-02-15 ENCOUNTER — Encounter (INDEPENDENT_AMBULATORY_CARE_PROVIDER_SITE_OTHER): Payer: Self-pay | Admitting: General Surgery

## 2012-02-15 ENCOUNTER — Encounter (INDEPENDENT_AMBULATORY_CARE_PROVIDER_SITE_OTHER): Payer: Medicare Other | Admitting: Surgery

## 2012-02-15 ENCOUNTER — Ambulatory Visit (INDEPENDENT_AMBULATORY_CARE_PROVIDER_SITE_OTHER): Payer: Medicare Other | Admitting: General Surgery

## 2012-02-15 VITALS — BP 130/84 | HR 78 | Temp 95.2°F | Ht 61.0 in | Wt 142.6 lb

## 2012-02-15 DIAGNOSIS — Z09 Encounter for follow-up examination after completed treatment for conditions other than malignant neoplasm: Secondary | ICD-10-CM

## 2012-02-15 NOTE — Patient Instructions (Signed)
Advance to level II diet. If there are no problems with this and advanced to level III diet. Avoid drinking sodas or using straws. May drive. Do not lift anything heavier than 10 pounds.

## 2012-02-15 NOTE — Progress Notes (Signed)
Operation: Laparoscopic repair of hiatal hernia and Nissen fundoplication by Dr. Michaell Cowing  Date: January 13, 2012   HPI: She is here for her first postoperative visit. Her course was complicated by postoperative respiratory failure and pneumonia as well as pleural effusion. She was readmitted to the hospital for that. She is feeling much better now. She is basically on a pured diet. No dysphagia with this. No nausea or vomiting. No heartburn. No gas bloating or diarrhea.   Physical Exam: Brooke [name] looks well and is in no acute distress.  Lungs-clear to auscultation  Abdomen-soft, incisions clean, dry, and intact.   Assessment: She is starting to make good progress now.  Plan:  Continue activity restrictions.  Advanced to level II diet and if tolerated procedure level III diet. Return visit with Dr. Michaell Cowing in 4 weeks.

## 2012-03-13 ENCOUNTER — Encounter (INDEPENDENT_AMBULATORY_CARE_PROVIDER_SITE_OTHER): Payer: Self-pay | Admitting: Surgery

## 2012-03-13 ENCOUNTER — Ambulatory Visit (INDEPENDENT_AMBULATORY_CARE_PROVIDER_SITE_OTHER): Payer: Medicare Other | Admitting: Surgery

## 2012-03-13 VITALS — BP 124/76 | HR 72 | Temp 98.3°F | Resp 16 | Ht 61.0 in | Wt 143.8 lb

## 2012-03-13 DIAGNOSIS — K440 Diaphragmatic hernia with obstruction, without gangrene: Secondary | ICD-10-CM

## 2012-03-13 NOTE — Progress Notes (Signed)
Subjective:     Patient ID: Brooke [name], female   DOB: Feb 06, 1932, 78 y.o.   MRN: [id number]  HPI  Brooke [name]  November 06, 1932 [id number]  Patient Care Team: Arabella Merles, CNM as PCP - General (Obstetrics and Gynecology) Meryl Dare, MD,FACG as Consulting Physician (Gastroenterology)  This patient is a 78 y.o.female who presents today for surgical evaluation.   Diagnosis: Giant paraesophageal hiatal hernia with reflux  Procedure: Laparoscopic paraesophageal hiatal hernia repair with Nissen fundoplication 01/13/2012  The patient comes today with her husband. She's feeling much better. Breathing well. Walking well. No abnormal soreness. Did not need narcotics on discharge. Energy level much better. No fevers or chills. Moving her bowels regularly. She thinks they're fair at best but when questioned is 1-2 day.  She does not like the taste of liquid potassium and wonders if she needs to continue it.  No heartburn or reflux. Wants to eat salads,t breads, and some steak. Swallowing roast beef &  soft foods without any difficulty.  Patient Active Problem List  Diagnoses  . ANEMIA, IRON DEFICIENCY  . HYPERTENSION  . HEMORRHOIDS, INTERNAL  . GERD  . DIVERTICULOSIS, COLON  . CONSTIPATION  . OSTEOPOROSIS  . ARTERIOVENOUS MALFORMATION  . COLONIC POLYPS, HYPERPLASTIC, HX OF  . Acute respiratory failure  . CHF (congestive heart failure)  . Pleural effusion, bilateral  . Hypokalemia  . Confusion    Past Medical History  Diagnosis Date  . Diverticulosis   . Osteoporosis   . Hypertension   . Iron deficiency anemia   . Cameron lesion, acute   . AVM (arteriovenous malformation)   . GERD (gastroesophageal reflux disease)   . Internal hemorrhoid   . Hiatal hernia   . Diverticulosis   . Cough   . Paraesophageal hernia   . Arthritis     hands   . History of hypokalemia   . Hyperlipidemia   . History of colon polyps   . Paraesophageal hernia     history of  . H/O: GI  bleed   . H/O: hematuria   . Praesophageal hiatal hernia - giant, irreducible 10/20/2011    Past Surgical History  Procedure Date  . Colonscopy   . Polypectomy   . Cesarean section 01/1965  . Dilation and curettage of uterus   . Hernia repair 12/2011  . Laparoscopic nissen fundoplication 01/13/2012    Procedure: LAPAROSCOPIC NISSEN FUNDOPLICATION;  Surgeon: Ardeth Sportsman, MD;  Location: [facility];  Service: General;  Laterality: N/A;  laparoscopic paraesphogeal hernia repair and fundoplication    History   Social History  . Marital Status: Married    Spouse Name: N/A    Number of Children: 1  . Years of Education: N/A   Occupational History  . Business owner     [employer]   Social History Main Topics  . Smoking status: Never Smoker   . Smokeless tobacco: Never Used  . Alcohol Use: 10.8 oz/week    18 Glasses of wine per week     1 glasses daily  . Drug Use: No  . Sexually Active: No   Other Topics Concern  . Not on file   Social History Narrative  . No narrative on file    Family History  Problem Relation Age of Onset  . Prostate cancer Brother   . Cancer Brother     prostate, and skin  . Colon cancer Neg Hx     Current outpatient prescriptions:furosemide (LASIX) 40 MG  tablet, Take 0.5 tablets (20 mg total) by mouth 2 (two) times daily., Disp: 60 tablet, Rfl: 1;  hydrochlorothiazide 25 MG tablet, Take 12.5 mg by mouth daily after breakfast. , Disp: , Rfl: ;  metoprolol (TOPROL-XL) 50 MG 24 hr tablet, Take 50 mg by mouth daily after breakfast. , Disp: , Rfl:  perphenazine-amitriptyline (ETRAFON/TRIAVIL) 2-10 MG TABS, Take 2 tablets by mouth at bedtime. , Disp: , Rfl: ;  potassium chloride 40 MEQ/15ML (20%) LIQD, Take 14 mEq/kg by mouth 2 (two) times daily., Disp: , Rfl: ;  simvastatin (ZOCOR) 20 MG tablet, Take 20 mg by mouth at bedtime. , Disp: , Rfl:   No Known Allergies  BP 124/76  Pulse 72  Temp(Src) 98.3 F (36.8 C) (Temporal)  Resp 16  Ht 5\' 1"   (1.549 m)  Wt 143 lb 12.8 oz (65.227 kg)  BMI 27.17 kg/m2     Review of Systems  Constitutional: Negative for fever, chills, diaphoresis, appetite change and fatigue.  HENT: Negative for ear pain, sore throat and trouble swallowing.   Eyes: Negative for photophobia and visual disturbance.  Respiratory: Negative for cough and choking.   Cardiovascular: Negative for chest pain and palpitations.  Gastrointestinal: Negative for nausea, vomiting, abdominal pain, diarrhea, constipation, anal bleeding and rectal pain.  Genitourinary: Negative for dysuria, frequency and difficulty urinating.  Musculoskeletal: Negative for myalgias and gait problem.  Skin: Negative for color change, pallor and rash.  Neurological: Negative for dizziness, speech difficulty, weakness and numbness.  Hematological: Negative for adenopathy.  Psychiatric/Behavioral: Negative for confusion and agitation. The patient is not nervous/anxious.        Objective:   Physical Exam  Constitutional: She is oriented to person, place, and time. She appears well-developed and well-nourished. No distress.  HENT:  Head: Normocephalic.  Mouth/Throat: Oropharynx is clear and moist. No oropharyngeal exudate.  Eyes: Conjunctivae and EOM are normal. Pupils are equal, round, and reactive to light. No scleral icterus.  Neck: Normal range of motion. No tracheal deviation present.  Cardiovascular: Normal rate and intact distal pulses.   Pulmonary/Chest: Effort normal. No respiratory distress. She exhibits no tenderness.  Abdominal: Soft. She exhibits no distension and no mass. There is no tenderness. There is no guarding. Hernia confirmed negative in the right inguinal area and confirmed negative in the left inguinal area.       Incisions clean with normal healing ridges.  No hernias  Musculoskeletal: Normal range of motion. She exhibits no tenderness.  Lymphadenopathy:       Right: No inguinal adenopathy present.       Left: No  inguinal adenopathy present.  Neurological: She is alert and oriented to person, place, and time. No cranial nerve deficit. She exhibits normal muscle tone. Coordination normal.  Skin: Skin is warm and dry. No rash noted. She is not diaphoretic.  Psychiatric: She has a normal mood and affect. Her behavior is normal.       Assessment:     2 months s/p lap PEHiatal Hernia repair w Nissen fundoplication, recovering well    Plan:     Increase activity as tolerated.  Do not push through pain.  Advanced on diet as tolerated. Bowel regimen to avoid problems.  Consider fiber regimen.  Okay to advance to regular diet as long as chews food well. Do not force through dysphasia.  Consider splitting potassium pill if her primary care physician feels it is okay.  Return to clinic p.r.n. The patient expressed understanding and appreciation

## 2012-03-13 NOTE — Patient Instructions (Signed)

## 2012-08-14 ENCOUNTER — Other Ambulatory Visit: Payer: Self-pay | Admitting: Family Medicine

## 2012-08-14 DIAGNOSIS — I10 Essential (primary) hypertension: Secondary | ICD-10-CM | POA: Diagnosis not present

## 2012-08-14 DIAGNOSIS — M810 Age-related osteoporosis without current pathological fracture: Secondary | ICD-10-CM | POA: Diagnosis not present

## 2012-08-14 DIAGNOSIS — Z1231 Encounter for screening mammogram for malignant neoplasm of breast: Secondary | ICD-10-CM

## 2012-08-14 DIAGNOSIS — E782 Mixed hyperlipidemia: Secondary | ICD-10-CM | POA: Diagnosis not present

## 2012-08-14 DIAGNOSIS — Z0000 Encounter for general adult medical examination without abnormal findings: Secondary | ICD-10-CM | POA: Diagnosis not present

## 2012-08-29 ENCOUNTER — Ambulatory Visit
Admission: RE | Admit: 2012-08-29 | Discharge: 2012-08-29 | Disposition: A | Discharge status: Home / Self Care | Payer: Medicare Other | Source: Ambulatory Visit | Attending: Family Medicine | Admitting: Family Medicine

## 2012-08-29 DIAGNOSIS — Z1231 Encounter for screening mammogram for malignant neoplasm of breast: Secondary | ICD-10-CM | POA: Diagnosis not present

## 2012-09-19 DIAGNOSIS — M810 Age-related osteoporosis without current pathological fracture: Secondary | ICD-10-CM | POA: Diagnosis not present

## 2012-09-28 DIAGNOSIS — Z23 Encounter for immunization: Secondary | ICD-10-CM | POA: Diagnosis not present

## 2012-11-03 ENCOUNTER — Other Ambulatory Visit: Payer: Self-pay

## 2012-11-03 MED ORDER — OMEPRAZOLE 20 MG PO CPDR: 20.0000 mg | DELAYED_RELEASE_CAPSULE | Freq: Every day | ORAL | Status: DC

## 2012-11-06 DIAGNOSIS — M810 Age-related osteoporosis without current pathological fracture: Secondary | ICD-10-CM | POA: Diagnosis not present

## 2013-02-15 DIAGNOSIS — E782 Mixed hyperlipidemia: Secondary | ICD-10-CM | POA: Diagnosis not present

## 2013-04-14 IMAGING — RF DG UGI W/ GASTROGRAFIN
18 of 22 series · 18 of 22 positions shown · non-contrast
Comparison: Upper GI series 09/17/2011 and abdominal CT
04/19/2011.

CLINICAL DATA: Para esophageal hernia status post Nissen
fundoplication.

UPPER GI SERIES WITH KUB
TECHNIQUE: Routine upper GI series was performed with 50 ml water-
soluble contrast (9mnipaque-BJJ).
Fluoroscopy Time: 1.3 minutes

[Series 1: run · 1 of 1 slices shown (1 of 17)]
[im 1/1]
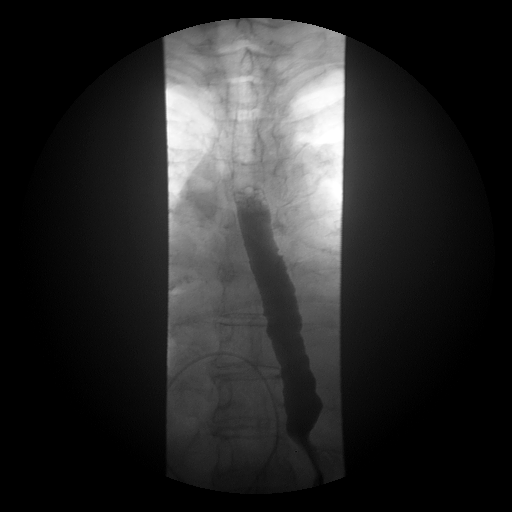

[Series 2: run · 1 of 1 slices shown (2 of 17)]
[im 1/1]
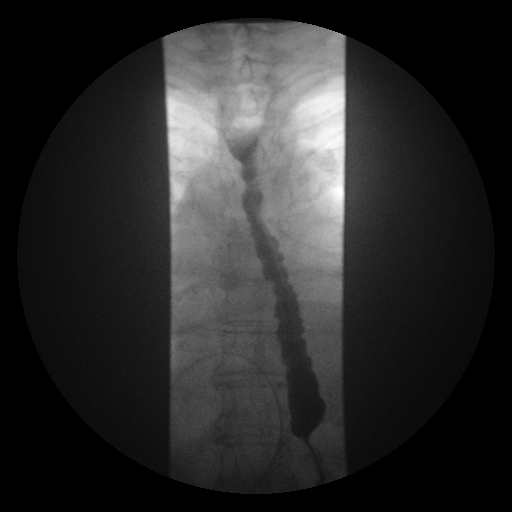

[Series 4: run · 1 of 1 slices shown (3 of 17)]
[im 1/1]
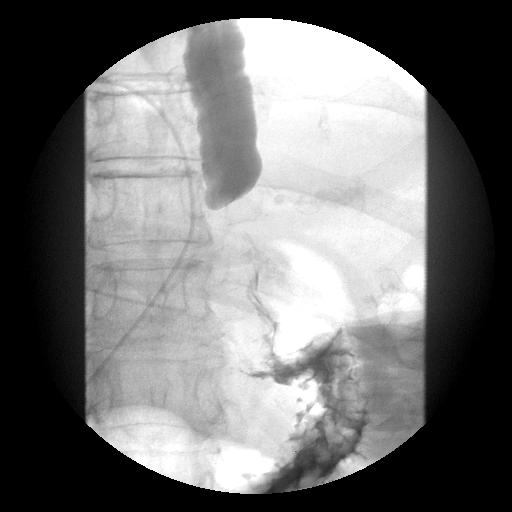

[Series 5: run · 1 of 1 slices shown (4 of 17)]
[im 1/1]
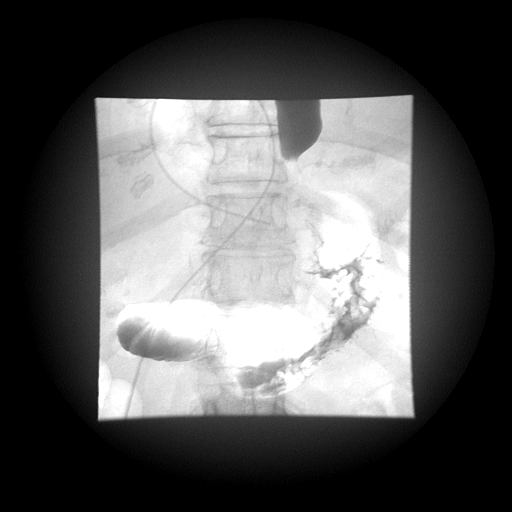

[Series 6: run · 1 of 1 slices shown (5 of 17)]
[im 1/1]
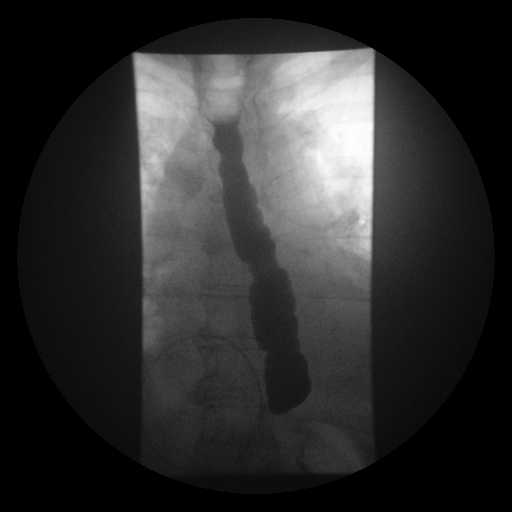

[Series 7: run · 1 of 1 slices shown (6 of 17)]
[im 1/1]
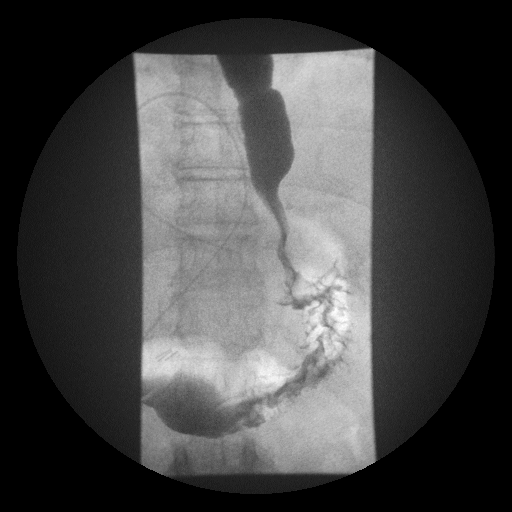

[Series 8: run · 1 of 1 slices shown (7 of 17)]
[im 1/1]
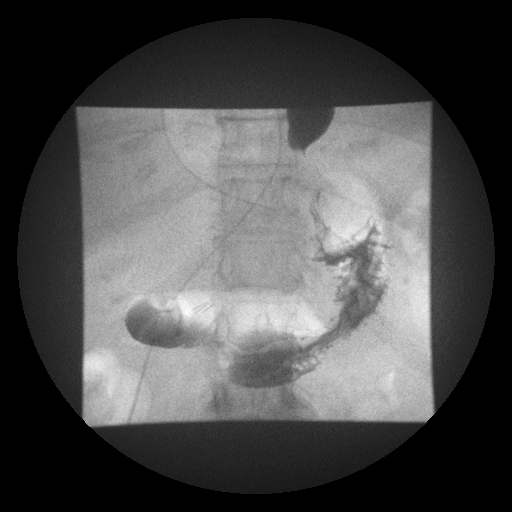

[Series 10: run · 1 of 1 slices shown (8 of 17)]
[im 1/1]
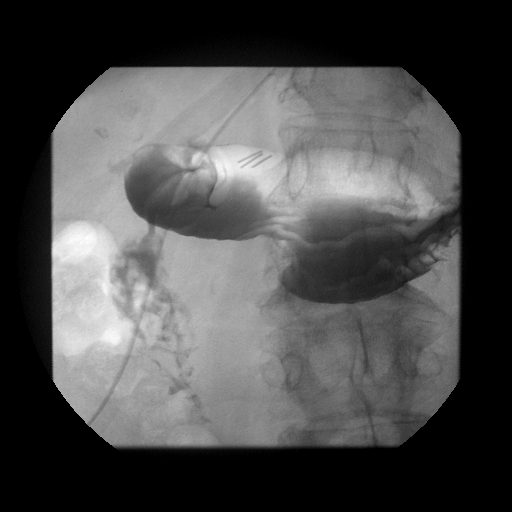

[Series 11: run · 1 of 1 slices shown (9 of 17)]
[im 1/1]
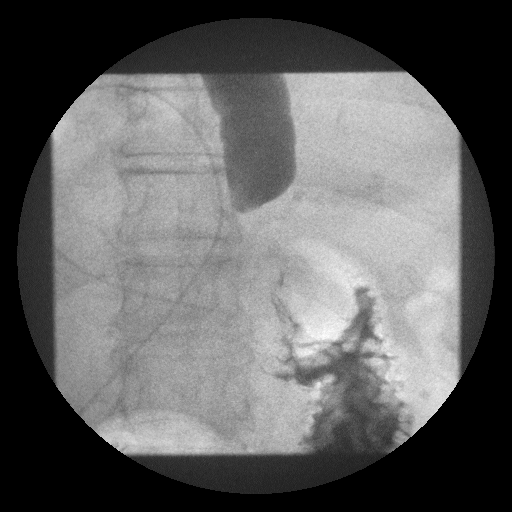

[Series 12: run · 1 of 1 slices shown (10 of 17)]
[im 1/1]
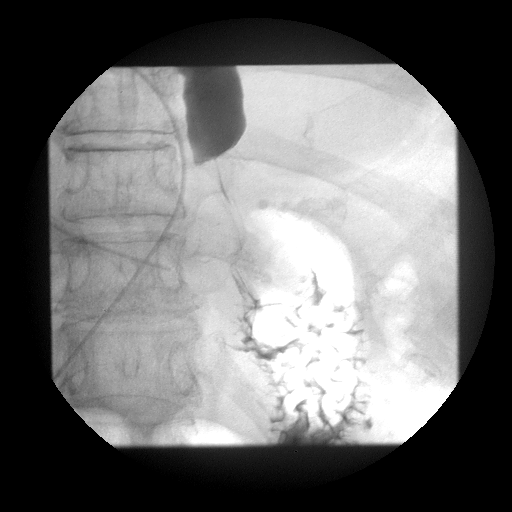

[Series 13: run · 1 of 1 slices shown (11 of 17)]
[im 1/1]
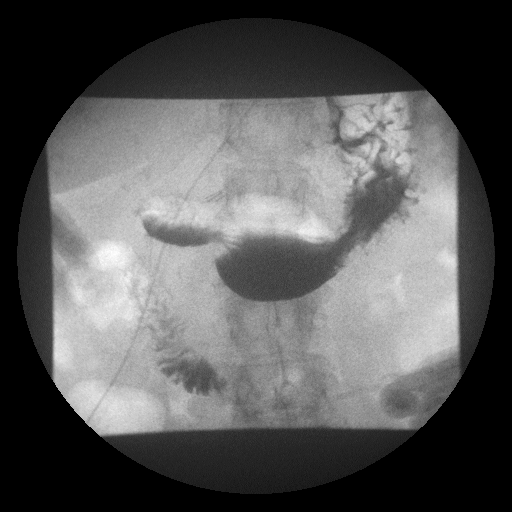

[Series 15: run · 1 of 1 slices shown (12 of 17)]
[im 1/1]
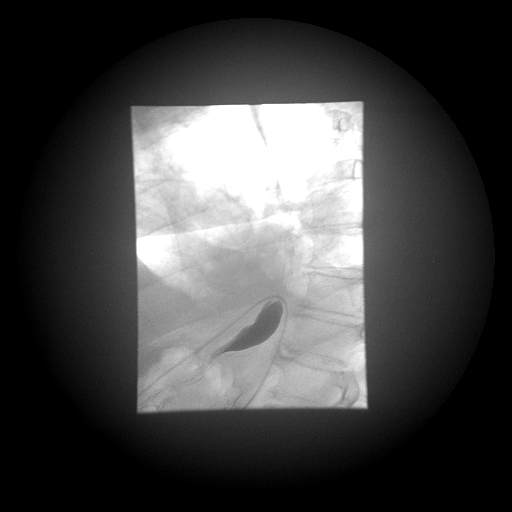

[Series 16: run · 1 of 1 slices shown (13 of 17)]
[im 1/1]
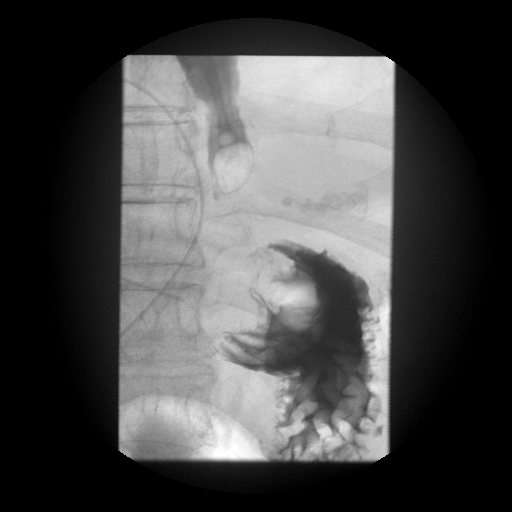

[Series 17: run · 1 of 1 slices shown (14 of 17)]
[im 1/1]
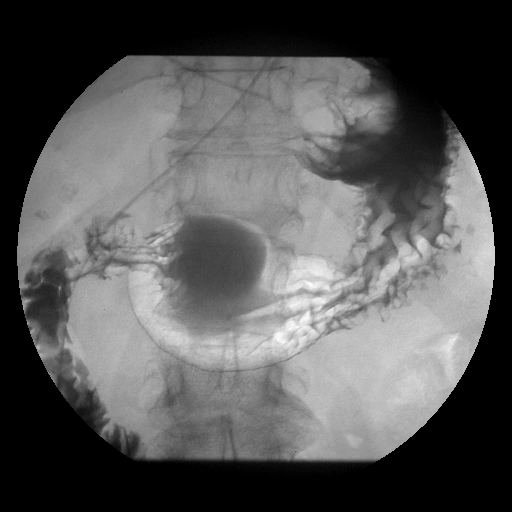

[Series 18: run · 1 of 1 slices shown (15 of 17)]
[im 1/1]
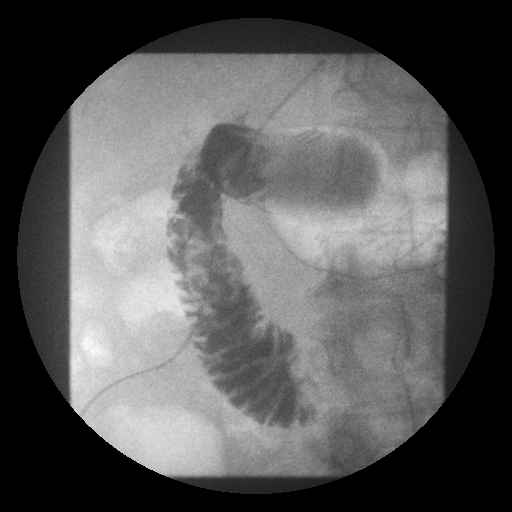

[Series 19: run · 1 of 1 slices shown (16 of 17)]
[im 1/1]
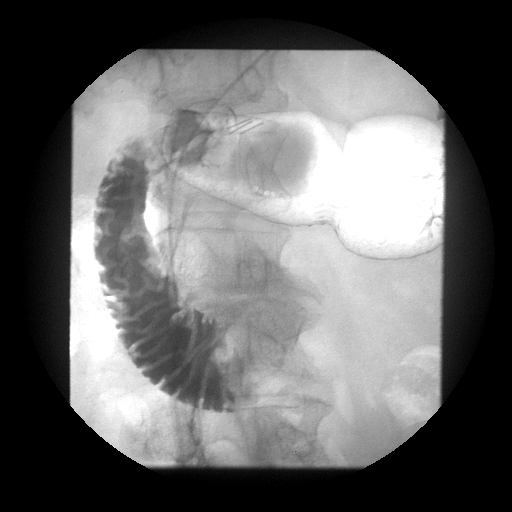

[Series 21: run · 1 of 1 slices shown (17 of 17)]
[im 1/1]
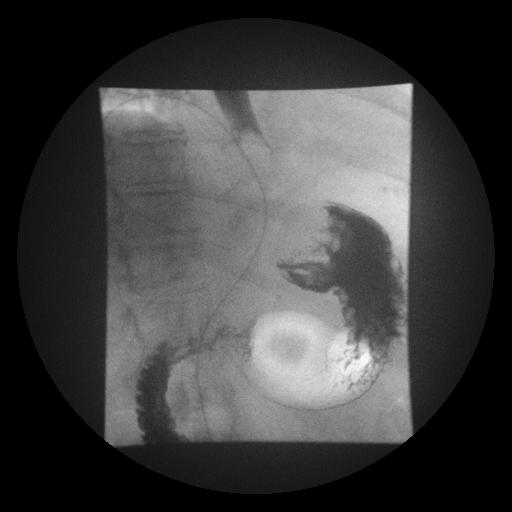

[Series 1001: view not recorded · 0.20mm/px · 1 of 1 slices shown]
[im 1/1]
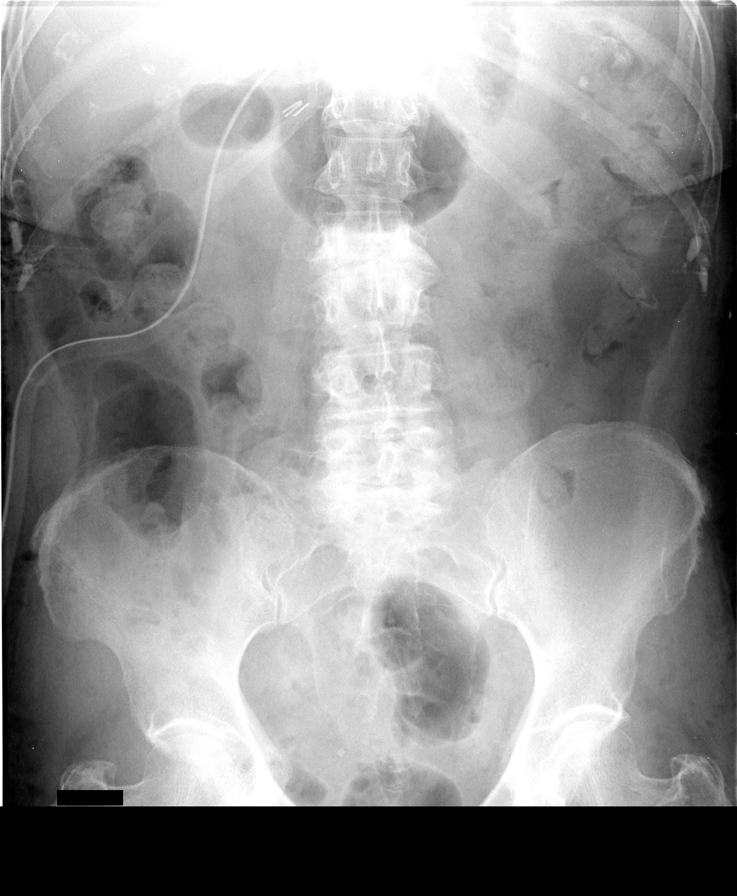

[18 of 22 positions shown; findings below may reference images not displayed]

FINDINGS: [facility] abdominal radiograph demonstrates a surgical drain
overlying the upper abdomen.  The bowel gas pattern is normal.
Diffuse lumbar spondylosis is noted.

On ingestion of water soluble contrast, mild esophageal spasm is
noted.  The stomach is now repositioned beneath the diaphragm.
There is expected extrinsic mass effect on the gastroesophageal
junction consistent with Nissen fundoplication.  There is no
esophageal obstruction or extravasation.  The proximal stomach is
incompletely distended.  Allowing for this, there is no definite
mucosal abnormality.  There is no evidence of gastric outlet
obstruction.  The proximal small bowel appears normal.
IMPRESSION: 1.  No evidence of leak or obstruction status post repair of para
esophageal hernia and Nissen fundoplication.
2.  Mild esophageal spasm.

## 2013-05-10 DIAGNOSIS — M810 Age-related osteoporosis without current pathological fracture: Secondary | ICD-10-CM | POA: Diagnosis not present

## 2013-08-17 DIAGNOSIS — M810 Age-related osteoporosis without current pathological fracture: Secondary | ICD-10-CM | POA: Diagnosis not present

## 2013-08-17 DIAGNOSIS — Z01419 Encounter for gynecological examination (general) (routine) without abnormal findings: Secondary | ICD-10-CM | POA: Diagnosis not present

## 2013-08-17 DIAGNOSIS — E782 Mixed hyperlipidemia: Secondary | ICD-10-CM | POA: Diagnosis not present

## 2013-08-17 DIAGNOSIS — I10 Essential (primary) hypertension: Secondary | ICD-10-CM | POA: Diagnosis not present

## 2013-08-17 DIAGNOSIS — Z0000 Encounter for general adult medical examination without abnormal findings: Secondary | ICD-10-CM | POA: Diagnosis not present

## 2013-08-17 DIAGNOSIS — R413 Other amnesia: Secondary | ICD-10-CM | POA: Diagnosis not present

## 2013-08-17 DIAGNOSIS — Z1331 Encounter for screening for depression: Secondary | ICD-10-CM | POA: Diagnosis not present

## 2013-10-04 DIAGNOSIS — Z23 Encounter for immunization: Secondary | ICD-10-CM | POA: Diagnosis not present

## 2013-10-25 DIAGNOSIS — H2510 Age-related nuclear cataract, unspecified eye: Secondary | ICD-10-CM | POA: Diagnosis not present

## 2013-10-25 DIAGNOSIS — H0230 Blepharochalasis unspecified eye, unspecified eyelid: Secondary | ICD-10-CM | POA: Diagnosis not present

## 2013-11-12 DIAGNOSIS — M810 Age-related osteoporosis without current pathological fracture: Secondary | ICD-10-CM | POA: Diagnosis not present

## 2014-04-25 ENCOUNTER — Telehealth: Payer: Self-pay | Admitting: Licensed Clinical Social Worker

## 2014-04-25 NOTE — Telephone Encounter (Deleted)
Patient's daughter Jennifer called today stating that patients pain has come back for 2 days, she states it is like it was when it first started. Daughter stated that Dr. Elsner called in Doxycyline for her last evening and she is taking it today. Patient is going to see her pcp today in [place] and wanted to know if Dr, Campbell would want to see her Mom because they are from [place], [place] and it is hard to get patient dressed and out of the house with this pain. Please advise  

## 2014-05-13 DIAGNOSIS — M810 Age-related osteoporosis without current pathological fracture: Secondary | ICD-10-CM | POA: Diagnosis not present

## 2014-06-11 ENCOUNTER — Other Ambulatory Visit: Payer: Self-pay | Admitting: Family Medicine

## 2014-06-11 DIAGNOSIS — R413 Other amnesia: Secondary | ICD-10-CM | POA: Diagnosis not present

## 2014-06-21 ENCOUNTER — Ambulatory Visit
Admission: RE | Admit: 2014-06-21 | Discharge: 2014-06-21 | Disposition: A | Discharge status: Home / Self Care | Payer: Medicare Other | Source: Ambulatory Visit | Attending: Family Medicine | Admitting: Family Medicine

## 2014-06-21 DIAGNOSIS — R413 Other amnesia: Secondary | ICD-10-CM | POA: Diagnosis not present

## 2014-06-21 DIAGNOSIS — R930 Abnormal findings on diagnostic imaging of skull and head, not elsewhere classified: Secondary | ICD-10-CM | POA: Diagnosis not present

## 2014-06-21 MED ORDER — GADOBENATE DIMEGLUMINE 529 MG/ML IV SOLN
12.0000 mL | Freq: Once | INTRAVENOUS | Status: AC | PRN
  Administered 2014-06-21: 12 mL via INTRAVENOUS

## 2014-07-09 ENCOUNTER — Ambulatory Visit: Payer: BLUE CROSS/BLUE SHIELD | Admitting: Neurology

## 2014-07-15 ENCOUNTER — Encounter: Payer: Self-pay | Admitting: Neurology

## 2014-07-15 ENCOUNTER — Encounter (INDEPENDENT_AMBULATORY_CARE_PROVIDER_SITE_OTHER): Payer: Self-pay

## 2014-07-15 ENCOUNTER — Ambulatory Visit (INDEPENDENT_AMBULATORY_CARE_PROVIDER_SITE_OTHER): Payer: Medicare Other | Admitting: Neurology

## 2014-07-15 VITALS — BP 153/81 | HR 67 | Temp 96.5°F | Ht 61.0 in | Wt 160.0 lb

## 2014-07-15 DIAGNOSIS — G3184 Mild cognitive impairment, so stated: Secondary | ICD-10-CM | POA: Diagnosis not present

## 2014-07-15 DIAGNOSIS — R930 Abnormal findings on diagnostic imaging of skull and head, not elsewhere classified: Secondary | ICD-10-CM | POA: Diagnosis not present

## 2014-07-15 DIAGNOSIS — R9082 White matter disease, unspecified: Secondary | ICD-10-CM

## 2014-07-15 NOTE — Patient Instructions (Signed)
I think overall you are doing fairly well but I do want to suggest a few things today:  Remember to drink plenty of fluid, eat healthy meals and do not skip any meals. Try to eat protein with a every meal and eat a healthy snack such as fruit or nuts in between meals. Try to keep a regular sleep-wake schedule and try to exercise daily, particularly in the form of walking, 20-30 minutes a day, if you can. Good nutrition, proper sleep and exercise can help her cognitive function.  Engage in social activities in your community and with your family and try to keep up with current events by reading the newspaper or watching the news. If you have computer and can go online, try [email or web address]. Also, you may like to do word finding puzzles or crossword puzzles.  As far as your medications are concerned, I would like to suggest    As far as diagnostic testing: we will consider neuropsychological testing next time as you would like to hold off for now. We will consider increasing your donepezil next.   I would like to see you back in 6 months, sooner if we need to. Please call us with any interim questions, concerns, problems, updates.

## 2014-07-15 NOTE — Progress Notes (Signed)
Subjective:    Patient ID: Brooke [name] is a 80 y.o. female.  HPI    Star Age, MD, PhD [facility] [address], [address] [place], [place] [address]  Dear Dr. Brigitte Pulse,  I saw your patient, Brooke [name], upon your kind request in my neurologic clinic today for initial consultation of her memory loss. The patient is accompanied by her husband today. As you know, [name] is an 80 year old right-handed woman with an underlying medical history of hypertension, reflux disease, depression, allergies, osteoporosis, status post hiatal hernia repair, who has had memory loss for the past several months to a year. Per family, this has been more progressive in the last few months. Blood work from August 2014 was reviewed, this included CMP which was unremarkable and cholesterol panel which showed LDL of 108. She was started on Aricept in August of last year at 5 mg strength. She recently had a brain MRI with and without contrast on 06/21/2014: 1. No evidence of acute intracranial abnormality or mass. 2. Moderate chronic small vessel ischemic disease. In addition, personally reviewed the images through the PACS system.  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 She feels overall improved and is not keen on trying anything new.   She has been taking perphenazine/amitriptyline 2 pills each night for sleep.  She has no VH/AH and no delusion and she drives without issues.  She denies depression. She has one son.  The patient denies prior TIA or stroke symptoms, such as sudden onset of one sided weakness, numbness, tingling, slurring of speech or droopy face, hearing loss, tinnitus, diplopia or visual field cut or monocular loss of vision, and denies recurrent headaches.  Of note, the patient reports snoring, and there is no  report of witnessed apneas or choking sensations while asleep. she has been sleeping semi-upright since her hiatal hernia surgery 2 years ago.  She drinks wine 1 glass a day, she drinks coffee 1 cup in the morning and 1 soda per day. She is a non-smoker.  She has a B12 check and RPR in 1/13. She had a sister with AD and a MA with AD.   Her Past Medical History Is Significant For: Past Medical History  Diagnosis Date  . Diverticulosis   . Osteoporosis   . Hypertension   . Iron deficiency anemia   . Cameron lesion, acute   . AVM (arteriovenous malformation)   . GERD (gastroesophageal reflux disease)   . Internal hemorrhoid   . Hiatal hernia   . Diverticulosis   . Cough   . Paraesophageal hernia   . Arthritis     hands   . History of hypokalemia   . Hyperlipidemia   . History of colon polyps   . Paraesophageal hernia     history of  . H/O: GI bleed   . H/O: hematuria   . Praesophageal hiatal hernia - giant, irreducible 10/20/2011    Her Past Surgical History Is Significant For: Past Surgical History  Procedure Laterality Date  . Colonscopy    . Polypectomy    . Cesarean section  01/1965  . Dilation and curettage of uterus    . Hernia repair  12/2011  . Laparoscopic nissen fundoplication  06/25/1600    Procedure: LAPAROSCOPIC NISSEN FUNDOPLICATION;  Surgeon: Adin Hector, MD;  Location:  [facility];  Service: General;  Laterality: N/A;  laparoscopic paraesphogeal hernia repair and fundoplication    Her Family History Is Significant For: Family History  Problem Relation Age of Onset  . Prostate cancer Brother   . Cancer Brother     prostate, and skin  . Colon cancer Neg Hx     Her Social History Is Significant For: History   Social History  . Marital Status: Married    Spouse Name: N/A    Number of Children: 1  . Years of Education: N/A   Occupational History  . Business owner     [employer]   Social History Main Topics  . Smoking status: Never Smoker   .  Smokeless tobacco: Never Used  . Alcohol Use: 10.8 oz/week    18 Glasses of wine per week     Comment: 1 glasses daily  . Drug Use: No  . Sexual Activity: No   Other Topics Concern  . None   Social History Narrative  . None    Her Allergies Are:  Allergies  Allergen Reactions  . Oxycontin [Oxycodone Hcl]   :   Her Current Medications Are:  Outpatient Encounter Prescriptions as of 07/15/2014  Medication Sig  . denosumab (PROLIA) 60 MG/ML SOLN injection Inject 60 mg into the skin every 6 (six) months. Administer in upper arm, thigh, or abdomen  . donepezil (ARICEPT) 5 MG tablet Take 1 tablet by mouth daily.  . hyoscyamine (LEVSIN) 0.125 MG/5ML ELIX Take 0.125 mg by mouth every 6 (six) hours as needed.  . metoprolol (TOPROL-XL) 50 MG 24 hr tablet Take 50 mg by mouth daily after breakfast.   . omeprazole (PRILOSEC) 20 MG capsule Take 1 capsule (20 mg total) by mouth daily.  Marland Kitchen perphenazine-amitriptyline (ETRAFON/TRIAVIL) 2-10 MG TABS Take 2 tablets by mouth at bedtime.   . potassium chloride 40 MEQ/15ML (20%) LIQD Take 14 mEq/kg by mouth 2 (two) times daily.  . simvastatin (ZOCOR) 20 MG tablet Take 20 mg by mouth at bedtime.   . furosemide (LASIX) 40 MG tablet Take 0.5 tablets (20 mg total) by mouth 2 (two) times daily.  . [DISCONTINUED] hydrochlorothiazide 25 MG tablet Take 12.5 mg by mouth daily after breakfast.   :  Review of Systems:  Out of a complete 14 point review of systems, all are reviewed and negative with the exception of these symptoms as listed below:   Review of Systems  Constitutional: Negative.   HENT: Positive for rhinorrhea.   Eyes: Negative.   Respiratory: Positive for cough.   Cardiovascular: Negative.   Gastrointestinal: Negative.   Endocrine: Negative.   Genitourinary: Negative.   Musculoskeletal: Negative.   Skin: Negative.   Allergic/Immunologic: Negative.   Neurological:       Memory loss   Hematological: Negative.   Psychiatric/Behavioral:  Positive for sleep disturbance (snoring).    Objective:  Neurologic Exam  Physical Exam  Physical Examination:   Filed Vitals:   07/15/14 0956  BP: 153/81  Pulse: 67  Temp: 96.5 F (35.8 C)    General Examination: The patient is a very pleasant 80 y.o. female in no acute distress. She is calm and cooperative with the exam. She denies Auditory Hallucinations and Visual Hallucinations. She is well groomed and situated in a chair.   HEENT: Normocephalic, atraumatic, pupils are equal, round and reactive to light and accommodation. Funduscopic exam is normal with sharp disc margins noted. Extraocular tracking shows no significant saccadic breakdown without nystagmus noted. Hearing  is intact. Tympanic membranes are clear bilaterally. Face is symmetric with no facial masking and normal facial sensation. There is no lip, neck or jaw tremor. Neck is not rigid with intact passive ROM. There are no carotid bruits on auscultation. Oropharynx exam reveals mild mouth dryness. No significant airway crowding is noted. Mallampati is class III. Tongue protrudes centrally and palate elevates symmetrically.    Chest: is clear to auscultation without wheezing, rhonchi or crackles noted.  Heart: sounds are regular and normal without murmurs, rubs or gallops noted.   Abdomen: is soft, non-tender and non-distended with normal bowel sounds appreciated on auscultation.  Extremities: There is no pitting edema in the distal lower extremities bilaterally. Pedal pulses are intact.   Skin: is warm and dry with no trophic changes noted. Age-related changes are noted on the skin.   Musculoskeletal: exam reveals no obvious joint deformities, tenderness or joint swelling or erythema.   Neurologically:  Mental status: The patient is awake and alert, paying good  attention. She is able to provide the history. Her husband provides details. She is oriented to: person, place, time/date, situation, day of week, month of  year and year. Her memory, attention, language and knowledge are very mildly. There is no aphasia, agnosia, apraxia or anomia. There is a no significant degree of bradyphrenia. Speech is not hypophonic with no dysarthria noted. Mood is congruent and affect is normal.  Her MMSE (Mini-Mental state exam) score is 26/30.  CDT (Clock Drawing Test) score is 3/4.  AFT (Animal Fluency Test) score is 6.   Cranial nerves are as described above under HEENT exam. In addition, shoulder shrug is normal with equal shoulder height noted.  Motor exam: Normal bulk, and strength for age is noted. Tone is not rigid with absence of cogwheeling in the extremities. There is overall no significant bradykinesia. There is no drift or rebound. There is no tremor.  Romberg is negative. Reflexes are 1+ in the upper extremities and 1+ in the lower extremities. Toes are downgoing bilaterally. Fine motor skills: Finger taps, hand movements, and rapid alternating patting are not impaired bilaterally. Foot taps and foot agility are not impaired bilaterally.   Cerebellar testing shows no dysmetria or intention tremor on finger to nose testing. Heel to shin is unremarkable. There is no truncal or gait ataxia.   Sensory exam is intact to light touch, pinprick, vibration, temperature sense and proprioception in the upper and lower extremities.   Gait, station and balance: She stands up from the seated position with no significant difficulty and needs no assistance. No veering to one side is noted. No leaning to one side. Posture is age-appropriate. Stance is narrow-based. She turns in 3 steps. Tandem walk is slightly difficult for her.    Assessment and Plan:  In summary, OREATHA FABRY is a very pleasant 80 y.o.-year old female with an underlying medical history of hypertension, reflux disease, depression, allergies, osteoporosis, status post hiatal hernia repair, who has had memory loss for the past several months to a year. This  became worse in the past few months. She has been on low-dose Aricept with good tolerance. She subjectively feels improved since she started it and also improved overall since she had less stress at work. She is trying to reduce her stressors. She has not been exercising regularly and does not drink enough water. She would like to continue with the current treatment. I talked to her and her husband at length today and we also talked about  her brain MRI findings. She had MMSE of 26 which would be in keeping with mild cognitive impairment but should her attention is a little low and her clock drawing also shows 1 point loss. She may have mild Alzheimer's dementia and has a family history of Alzheimer's dementia. I think it certainly justifies continuation of her donepezil but she would like to hold off on any additional testing or increases in her medications at this time. I think we can wait for another 6 months and I would like for her to consider having a formal neuropsychological evaluation at the time. She is reluctant to pursue this just yet. We will also consider increasing her donepezil at the time. She had reasonable blood work recently. I do not see a recent thyroid function test which she may have had with your office. Please check on this.  We talked about medical treatments and non-pharmacological approaches. We talked about maintaining a healthy lifestyle in general and staying active mentally and physically. I encouraged the patient to eat healthy, exercise daily and keep well hydrated, to keep a scheduled bedtime and wake time routine, to not skip any meals and eat healthy snacks in between meals and to have protein with every meal. I stressed the importance of regular exercise, within of course the patient's own mobility limitations. I encouraged the patient to keep up with current events by reading the news paper or watching the news and to do word puzzles, or if feasible, to go on [email or web address].    As far as further diagnostic testing is concerned, I suggested the following: no change. Consider formal memory testing in the form of neuropsychological evaluation.  As far as medications are concerned, I recommended the following at this time: no change. But we will consider increasing donepezil to the full dose of 10 mg next time  I answered all their questions today and the patient and her husband were in agreement with the above outlined plan. I would like to see the patient back in 6 months, sooner if the need arises and encouraged them to call with any interim questions, concerns, problems, or updates.  Thank you very much for allowing me to participate in the care of this nice patient. If I can be of any further assistance to you please do not hesitate to call me at [phone].  Sincerely,   Star Age, MD, PhD

## 2014-08-22 DIAGNOSIS — I10 Essential (primary) hypertension: Secondary | ICD-10-CM | POA: Diagnosis not present

## 2014-08-22 DIAGNOSIS — K219 Gastro-esophageal reflux disease without esophagitis: Secondary | ICD-10-CM | POA: Diagnosis not present

## 2014-08-22 DIAGNOSIS — E538 Deficiency of other specified B group vitamins: Secondary | ICD-10-CM | POA: Diagnosis not present

## 2014-08-22 DIAGNOSIS — R413 Other amnesia: Secondary | ICD-10-CM | POA: Diagnosis not present

## 2014-08-22 DIAGNOSIS — E782 Mixed hyperlipidemia: Secondary | ICD-10-CM | POA: Diagnosis not present

## 2014-08-22 DIAGNOSIS — E669 Obesity, unspecified: Secondary | ICD-10-CM | POA: Diagnosis not present

## 2014-08-22 DIAGNOSIS — M810 Age-related osteoporosis without current pathological fracture: Secondary | ICD-10-CM | POA: Diagnosis not present

## 2014-08-22 DIAGNOSIS — Z0000 Encounter for general adult medical examination without abnormal findings: Secondary | ICD-10-CM | POA: Diagnosis not present

## 2014-08-22 DIAGNOSIS — Z23 Encounter for immunization: Secondary | ICD-10-CM | POA: Diagnosis not present

## 2014-09-12 DIAGNOSIS — Z23 Encounter for immunization: Secondary | ICD-10-CM | POA: Diagnosis not present

## 2014-10-07 DIAGNOSIS — L309 Dermatitis, unspecified: Secondary | ICD-10-CM | POA: Diagnosis not present

## 2014-10-10 ENCOUNTER — Encounter: Payer: Self-pay | Admitting: Gastroenterology

## 2014-10-10 DIAGNOSIS — M810 Age-related osteoporosis without current pathological fracture: Secondary | ICD-10-CM | POA: Diagnosis not present

## 2014-11-07 DIAGNOSIS — H02831 Dermatochalasis of right upper eyelid: Secondary | ICD-10-CM | POA: Diagnosis not present

## 2014-11-07 DIAGNOSIS — H02834 Dermatochalasis of left upper eyelid: Secondary | ICD-10-CM | POA: Diagnosis not present

## 2014-11-07 DIAGNOSIS — H2513 Age-related nuclear cataract, bilateral: Secondary | ICD-10-CM | POA: Diagnosis not present

## 2014-11-14 DIAGNOSIS — M810 Age-related osteoporosis without current pathological fracture: Secondary | ICD-10-CM | POA: Diagnosis not present

## 2014-11-20 ENCOUNTER — Telehealth: Payer: Self-pay | Admitting: Neurology

## 2014-11-20 NOTE — Telephone Encounter (Signed)
Confirmed appointment time change with patient, she verbalized understanding

## 2015-01-14 ENCOUNTER — Ambulatory Visit: Payer: Self-pay | Admitting: Neurology

## 2015-01-14 ENCOUNTER — Telehealth: Payer: Self-pay | Admitting: Neurology

## 2015-01-14 NOTE — Telephone Encounter (Signed)
Patient is a no show for today's appointment (01/14/15)

## 2015-02-17 DIAGNOSIS — M810 Age-related osteoporosis without current pathological fracture: Secondary | ICD-10-CM | POA: Diagnosis not present

## 2015-02-17 DIAGNOSIS — J301 Allergic rhinitis due to pollen: Secondary | ICD-10-CM | POA: Diagnosis not present

## 2015-02-17 DIAGNOSIS — L309 Dermatitis, unspecified: Secondary | ICD-10-CM | POA: Diagnosis not present

## 2015-02-17 DIAGNOSIS — D509 Iron deficiency anemia, unspecified: Secondary | ICD-10-CM | POA: Diagnosis not present

## 2015-02-17 DIAGNOSIS — I10 Essential (primary) hypertension: Secondary | ICD-10-CM | POA: Diagnosis not present

## 2015-02-17 DIAGNOSIS — K219 Gastro-esophageal reflux disease without esophagitis: Secondary | ICD-10-CM | POA: Diagnosis not present

## 2015-02-17 DIAGNOSIS — R413 Other amnesia: Secondary | ICD-10-CM | POA: Diagnosis not present

## 2015-02-17 DIAGNOSIS — E782 Mixed hyperlipidemia: Secondary | ICD-10-CM | POA: Diagnosis not present

## 2015-05-15 ENCOUNTER — Ambulatory Visit (INDEPENDENT_AMBULATORY_CARE_PROVIDER_SITE_OTHER): Payer: Medicare Other | Admitting: Neurology

## 2015-05-15 ENCOUNTER — Encounter: Payer: Self-pay | Admitting: Neurology

## 2015-05-15 VITALS — BP 148/78 | HR 68 | Resp 16 | Ht 61.0 in | Wt 161.0 lb

## 2015-05-15 DIAGNOSIS — G309 Alzheimer's disease, unspecified: Secondary | ICD-10-CM

## 2015-05-15 DIAGNOSIS — F0280 Dementia in other diseases classified elsewhere without behavioral disturbance: Secondary | ICD-10-CM

## 2015-05-15 MED ORDER — DONEPEZIL HCL 10 MG PO TABS: 10.0000 mg | ORAL_TABLET | Freq: Every day | ORAL | Status: DC

## 2015-05-15 NOTE — Progress Notes (Signed)
Subjective:    Patient ID: Brooke [name] is a 81 y.o. female.  HPI     Interim history:   Brooke [name] is an 82 year old right-handed woman with an underlying medical history of hypertension, reflux disease, depression, allergies, osteoporosis, status post hiatal hernia repair, who presents for follow-up consultation of her memory loss. The patient is accompanied by her husband again today. I first met her on 07/15/2014 at the request of her primary care physician, at which time she reported an approximately one-year history of memory loss. She had previously had blood work and a brain MRI and was taking Aricept 5 mg once daily. I asked her to continue this. We talked about potentially pursuing formal cognitive testing with a neuropsychological evaluation but mutually decided to hold off. Her MMSE was 26 at the time. Of note, she no showed for an appointment on 01/14/2015.  Today, 05/15/2015: She reports most of her symptoms. She is able to provide a fairly good history. She feels stable as far as her memory is concerned. In fact, she feels a little better. She still on Aricept 5 mg once daily. She may not be as outgoing as she should be. She certainly does not drink enough water. She likes to drink sodas. Her husband reports that she is not as interactive and social. Nevertheless, he does agree that she reads the paper and they both agree that she is not exercising regularly. She tries to walk some. 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 They simply missed the appointment in January with me. She was drinking 2 glasses of wine daily. She has recently cut this back to 1 glass a day.   Previously:   She has had memory loss for the past year. Per family, this has been more progressive in the last few months. Blood work from August 2014 was reviewed, this included CMP which was unremarkable and cholesterol panel which showed LDL of 108. She was started on Aricept in August of last year at 5 mg strength.    She had a brain MRI with and without contrast on 06/21/2014: 1. No evidence of acute intracranial abnormality or mass. 2. Moderate chronic small vessel ischemic disease. In addition, personally reviewed the images through the PACS system.   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 She felt overall improved and was not keen on trying anything new.   She has been taking perphenazine/amitriptyline 2 pills each night for sleep.   She has no VH/AH and no delusion and she drives without issues.   She denies depression. She has one son.   The patient denies prior TIA or stroke symptoms, such as sudden onset of one sided weakness, numbness, tingling, slurring of speech or droopy face, hearing loss, tinnitus, diplopia or visual field cut or monocular loss of vision, and denies recurrent headaches.  Of note, the patient reports snoring, and there is no report of witnessed apneas or choking sensations while asleep. she has been sleeping semi-upright since her hiatal hernia surgery 2 years ago.   She drinks wine 1 glass a day, she drinks coffee 1 cup in the morning and 1 soda per day. She is a non-smoker.   She has a B12 check and RPR in 1/13. She had a sister with AD (died at age 36 or 74) and a MA with AD (  died in her early 34s).   Her Past Medical History Is Significant For: Past Medical History  Diagnosis Date  . Diverticulosis   . Osteoporosis   . Hypertension   . Iron deficiency anemia   . Cameron lesion, acute   . AVM (arteriovenous malformation)   . GERD (gastroesophageal reflux disease)   . Internal hemorrhoid   . Hiatal hernia   . Diverticulosis   . Cough   . Paraesophageal hernia   . Arthritis     hands   . History of hypokalemia   . Hyperlipidemia   . History of colon polyps   . Paraesophageal hernia     history of  . H/O: GI bleed   .  H/O: hematuria   . Praesophageal hiatal hernia - giant, irreducible 10/20/2011    Her Past Surgical History Is Significant For: Past Surgical History  Procedure Laterality Date  . Colonscopy    . Polypectomy    . Cesarean section  01/1965  . Dilation and curettage of uterus    . Hernia repair  12/2011  . Laparoscopic nissen fundoplication  1/61/0960    Procedure: LAPAROSCOPIC NISSEN FUNDOPLICATION;  Surgeon: Adin Hector, MD;  Location: [facility];  Service: General;  Laterality: N/A;  laparoscopic paraesphogeal hernia repair and fundoplication    Her Family History Is Significant For: Family History  Problem Relation Age of Onset  . Prostate cancer Brother   . Cancer Brother     prostate, and skin  . Colon cancer Neg Hx     Her Social History Is Significant For: History   Social History  . Marital Status: Married    Spouse Name: N/A  . Number of Children: 1  . Years of Education: N/A   Occupational History  . Business owner     [employer]   Social History Main Topics  . Smoking status: Never Smoker   . Smokeless tobacco: Never Used  . Alcohol Use: 10.8 oz/week    18 Glasses of wine per week     Comment: 1 glasses daily  . Drug Use: No  . Sexual Activity: No   Other Topics Concern  . None   Social History Narrative   2-3 cups of coffee a day     Her Allergies Are:  Allergies  Allergen Reactions  . Oxycontin [Oxycodone Hcl]   :   Her Current Medications Are:  Outpatient Encounter Prescriptions as of 05/15/2015  Medication Sig  . donepezil (ARICEPT) 5 MG tablet Take 1 tablet by mouth daily.  . hyoscyamine (LEVSIN) 0.125 MG/5ML ELIX Take 0.125 mg by mouth every 6 (six) hours as needed.  . metoprolol (TOPROL-XL) 50 MG 24 hr tablet Take 50 mg by mouth daily after breakfast.   . perphenazine-amitriptyline (ETRAFON/TRIAVIL) 2-10 MG TABS Take 2 tablets by mouth at bedtime.   . simvastatin (ZOCOR) 20 MG tablet Take 20 mg by mouth at bedtime.   .  [DISCONTINUED] denosumab (PROLIA) 60 MG/ML SOLN injection Inject 60 mg into the skin every 6 (six) months. Administer in upper arm, thigh, or abdomen  . [DISCONTINUED] furosemide (LASIX) 40 MG tablet Take 0.5 tablets (20 mg total) by mouth 2 (two) times daily.  . [DISCONTINUED] omeprazole (PRILOSEC) 20 MG capsule Take 1 capsule (20 mg total) by mouth daily.  . [DISCONTINUED] potassium chloride 40 MEQ/15ML (20%) LIQD Take 14 mEq/kg by mouth 2 (two) times daily.   No facility-administered encounter medications on file as of 05/15/2015.  :  Review of  Systems:  Out of a complete 14 point review of systems, all are reviewed and negative with the exception of these symptoms as listed below:   Review of Systems  All other systems reviewed and are negative.   Objective:  Neurologic Exam  Physical Exam Physical Examination:   Filed Vitals:   05/15/15 1125  BP: 148/78  Pulse: 68  Resp: 16   General Examination: The patient is a very pleasant 81 y.o. female in no acute distress. She is calm and cooperative with the exam. She denies Auditory Hallucinations and Visual Hallucinations. She is well groomed and situated in a chair.  she is in good spirits today.   HEENT: Normocephalic, atraumatic, pupils are equal, round and reactive to light and accommodation. Funduscopic exam is normal with sharp disc margins noted. Extraocular tracking shows no significant saccadic breakdown without nystagmus noted. Hearing is intact. Face is symmetric with no facial masking and normal facial sensation. There is no lip, neck or jaw tremor. Neck is not rigid with intact passive ROM. There are no carotid bruits on auscultation. Oropharynx exam reveals mild mouth dryness. No significant airway crowding is noted. Mallampati is class III. Tongue protrudes centrally and palate elevates symmetrically.    Chest: is clear to auscultation without wheezing, rhonchi or crackles noted.  Heart: sounds are regular and normal  without murmurs, rubs or gallops noted.   Abdomen: is soft, non-tender and non-distended with normal bowel sounds appreciated on auscultation.  Extremities: There is no pitting edema in the distal lower extremities bilaterally. Pedal pulses are intact.   Skin: is warm and dry with no trophic changes noted. Age-related changes are noted on the skin.   Musculoskeletal: exam reveals no obvious joint deformities, tenderness or joint swelling or erythema.   Neurologically:  Mental status: The patient is awake and alert, paying good  attention. She is able to provide the history. Her husband provides details. She is oriented to: person, place, time/date, situation, day of week, month of year and year. Her memory, attention, language and knowledge are very mildly. There is no aphasia, agnosia, apraxia or anomia. There is a no significant degree of bradyphrenia. Speech is not hypophonic with no dysarthria noted. Mood is congruent and affect is normal.   On 07/15/14: Her MMSE (Mini-Mental state exam) score is 26/30. CDT (Clock Drawing Test) score is 3/4. AFT (Animal Fluency Test) score is 6.   On 05/15/2015: MMSE: 21/30, CDT: 2/4, AFT: 6/min.  Cranial nerves are as described above under HEENT exam. In addition, shoulder shrug is normal with equal shoulder height noted.  Motor exam: Normal bulk, and strength for age is noted. Tone is not rigid with absence of cogwheeling in the extremities. There is overall no significant bradykinesia. There is no drift or rebound. There is no tremor.  Romberg is negative. Reflexes are 1+ in the upper extremities and 1+ in the lower extremities. Toes are downgoing bilaterally. Fine motor skills: Finger taps, hand movements, and rapid alternating patting are not impaired bilaterally. Foot taps and foot agility are not impaired bilaterally.   Cerebellar testing shows no dysmetria or intention tremor on finger to nose testing. Heel to shin is unremarkable. There is no truncal  or gait ataxia.   Sensory exam is intact to light touch, pinprick, vibration, temperature sense in the upper and lower extremities.   Gait, station and balance: She stands up from the seated position with no significant difficulty and needs no assistance. No veering to one side is noted.  No leaning to one side. Posture is age-appropriate. Stance is narrow-based. She turns in 3 steps. Tandem walk is slightly difficult for her, unchanged from last year.    Assessment and Plan:  In summary, Brooke [name] is  a very pleasant 81 year old female with an underlying medical history of hypertension, reflux disease, depression, allergies, osteoporosis, status post hiatal hernia repair, who has had memory loss for the past 1+ year. her history and findings are in keeping with mild Alzheimer's dementia, without behavioral disturbances. She continues to be on low-dose Aricept, 5 mg once daily. Her MMSE has declined some but subjectively she feels stable or even better. I recommended that we increase Aricept to 10 mg once daily. She was in agreement. To that end, I adjusted her prescription. We talked about her driving. She drives fairly well at this time. She has not had any instances of getting lost. Her husband adds that she drives rather slowly at times but otherwise she is a good driver. We will reevaluate as we go along. She has not been exercising regularly and does not drink enough water. She  is again advised to increase her water intake and reduce her soda intake. She is advised to limit herself to 1 glass of wine per day. She is encouraged to exercise in the form of walking. We talked about her previous brain MRI findings as well. I would like for [place] to pursue formal neuropsychological evaluation at this time. She is in agreement. We talked about maintaining a healthy lifestyle in general and staying active mentally and physically. I encouraged the patient to eat healthy, exercise daily and keep well  hydrated, to keep a scheduled bedtime and wake time routine, to not skip any meals and eat healthy snacks in between meals and to have protein with every meal. I stressed the importance of regular exercise, within of course the patient's own mobility limitations. I encouraged the patient to keep up with current events by reading the news paper or watching the news.   As far as further diagnostic testing is concerned, I suggested the following: formal memory testing in the form of neuropsychological evaluation.  As far as medications are concerned, I recommended the following at this time: increase donepezil to 10 mg once daily.   I would like to see her back in 6 months, sooner if the need arises.  I answered all their questions today and the patient and her husband were in agreement. I encouraged them to call with any interim questions, concerns, problems, or updates. I spent 25 minutes in total face-to-face time with the patient, more than 50% of which was spent in counseling and coordination of care, reviewing test results, reviewing medication and discussing or reviewing the diagnosis of dementia, its prognosis and treatment options.

## 2015-05-15 NOTE — Patient Instructions (Addendum)
We will increase your Aricept to 10 mg daily.   We will request formal cognitive testing with Dr. Valentina Shaggy, neuropsychologist. If you don't hear back from his office in the next couple of weeks, please call us back and we will investigate.   I will see you back in 6 months.   Drink more water!   Limit yourself to 1 glass of wine per day.   Drink less sodas.   Exercise in the form of walking.

## 2015-05-19 DIAGNOSIS — M810 Age-related osteoporosis without current pathological fracture: Secondary | ICD-10-CM | POA: Diagnosis not present

## 2015-08-12 ENCOUNTER — Ambulatory Visit: Payer: BLUE CROSS/BLUE SHIELD | Admitting: Psychology

## 2015-08-22 ENCOUNTER — Encounter: Payer: Self-pay | Admitting: Psychology

## 2015-08-22 ENCOUNTER — Ambulatory Visit: Payer: Medicare Other | Attending: Psychology | Admitting: Psychology

## 2015-08-22 DIAGNOSIS — G3184 Mild cognitive impairment, so stated: Secondary | ICD-10-CM | POA: Insufficient documentation

## 2015-08-22 NOTE — Progress Notes (Signed)
[facility]  [address]   Telephone [phone] [address] Fax [phone] [place], [place] [address]  Initial Contact Note  Name:  TIARA MAULTSBY Date of Birth; 1932-12-15 MRN:  [id number] Date:  08/22/2015  CASSIDIE VEIGA is an 81 y.o. female who was referred for neuropsychological evaluation by Star Age, MD.   A total of 5 hours was spent today reviewing medical records, interviewing (CPT [phone]) Philipp Deputy and administering and scoring neurocognitive tests (CPT [phone] & [phone]).  Preliminary Diagnostic Impression: Mild Cognitive Impairment, Multiple domains [G31.84]  There were no concerns expressed or behaviors displayed by Philipp Deputy that would require immediate attention.   A full report will follow once the planned testing has been completed. Her next appointment is scheduled for 08/26/15.   Jamey Ripa, Ph.D Licensed Psychologist 08/22/2015

## 2015-08-26 ENCOUNTER — Encounter: Payer: BLUE CROSS/BLUE SHIELD | Admitting: Psychology

## 2015-09-02 ENCOUNTER — Ambulatory Visit: Payer: Medicare Other | Attending: Psychology | Admitting: Psychology

## 2015-09-02 DIAGNOSIS — G3184 Mild cognitive impairment, so stated: Secondary | ICD-10-CM | POA: Diagnosis not present

## 2015-09-02 NOTE — Progress Notes (Addendum)
[facility]  [address]   Telephone [phone] [address] Fax [phone] [place], [place] [address]   Ricardo* This report should not be released without the consent of the client  Name:   Brooke [name]. Craw Date of Birth:  12/08/2032 [facility] MR#:  [id number] Dates of Evaluation: 08/22/15 & 09/02/15  Reason for Referral Brooke [name] is an 81 year-old right-handed woman who was referred for neuropsychological evaluation by Brooke Age, MD of [facility] as prompted by family report of her three year history of gradual memory loss. A brain MRI on 06/21/2014 revealed moderate chronic small vessel ischemic disease. She had been taking donepezil since August 2014 with report of subsequent subjective cognitive improvement.   Sources of Information Electronic medical records from the [facility] were reviewed. Brooke [name], her husband, Mr. Brooke [name], and her son, Mr. Brooke [name], were interviewed.    Chief Complaints Brooke [name], her husband and son agreed that she first began to display changes in cognitive functioning shortly after she underwent paraesophageal hiatal hernia surgery in January 2013. She reported that afterwards she noticed difficulty with word finding, recalling the names of some of her neighbors, reduced energy, decreased handwriting legibility and "loss of confidence".  She reported no other problems with language except for a longtime history of being a poor speller. She denied being inattentive or forgetful. Her husband agreed with Brooke [name] self-report and added that she has displayed a small-step, shuffling gait over the past three years. Her son has also noticed this gait change as well as a decline in his mother's short-term memory after the hernia surgery. He reported that she has had trouble accurately taking down names and numbers over the phone. All agreed that her  symptoms gradually worsened until she began taking donepezil in 2014, at which time her cognitive functioning appeared to stabilize and possibly improve slightly.   She and her husband described her as independently performing her basic and instrumental activities of daily living, the latter including home management, bill paying and driving. 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 Her son, who manages the business, reported that he no longer allows his mother to deal with financial tasks.    She reported no recent health problems or ongoing symptoms. She disputed several of the medical diagnoses listed in her chart. She did not cite any problems with gait, balance, pain, limb weakness, vision, hearing, sleep (history of longtime insomnia), daytime alertness, speech, appetite or swallowing.   There was no report of problems with mood, personality changes or incidences of poor judgment. She described herself as usually in good spirits. She did not report any recent life stressors. Her husband reported that she has been less interested in social activities within the past two years. She attributed her lack of desire to socialize to worry about being embarrassed should she struggle to find words during conversation. She denied experiencing sad mood, apathy, mood instability, loss of pleasure, undue anxiety, suicidal thoughts, vegetative disturbances, hallucinations or delusions.  Background Her past medical history as listed in her medical chart was notable for allergies (she disputed), arteriovenous malformation (she was not aware of this), arthritis in her hands, gastroesophageal reflux disease (she reported no symptoms for at least the past year), hypertension (she disputed) and osteoporosis (she disputed). She underwent surgery for a paraesophageal hiatal hernia repair in January 2013. Her post-operative course was complicated  by  respiratory failure, pneumonia and pleural effusion that necessitated a four day re-hospitalization.  She reported no history of developmental delays, unusual childhood illnesses or injuries, head injury, seizure activity, stroke-like symptoms, neurological infection or exposure to neurotoxic substances. She denied use of illicit drugs, tobacco products or abuse of alcohol.      Family medical history was notable for presumed Alzheimer's disease on her maternal side. She reported that her sister and maternal aunt showed signs of memory loss in their early 83. She reported no family history of psychiatric disorder.   She reported no history of serious emotional difficulties, use of psychiatric medication or mental health contacts.   Her current medications include donepezil, hyoscyamine, metoprolol, perphenazine-amitriptyline and simvastatin.   She lives with her husband of 40 years. They have an adult son.   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 Past jobs had typically included similar responsibilities.   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   Observations She appeared as an appropriately groomed and dressed woman in no apparent physical distress. She interacted in a pleasant and cooperative manner. Her speech was well articulated, fluent and of normal prosody. Her word usage, word finding and verbal organization seemed normal. She had no apparent problems understanding what was said to her. Her affect appeared within a wide range and was congruent with her thoughts. She did not display signs of emotional distress. Her thought processes were coherent and organized without loose associations, verbal perseverations or flight of ideas. There were no indications of unusual thought content or delusional thinking.  Evaluation  Procedures In addition to a review of medical records as well as clinical interview with Brooke [name] and her husband, the following tests or questionnaires were administered: Medical sales representative Naming Test Complex Ideational Material subtest of the Bowers Aphasia Examination Controlled Oral Word Association Test Drawings Geriatric Depression Scale (short version) Rey Complex Figure: copy Trail Making A & B Wechsler Adult Intelligence Scale-IV: Music therapist, Coding,  Digit Span, Matrix Reasoning & Similarities       Wechsler Memory Scale-IV Older Adult Battery Wide Range Achievement Test- 4: Word Reading [facility] Card Soring Test  Assessment Results Test Validity & Interpretative Considerations Test results were deemed to represent a valid measure of her cognitive functioning. She appeared to consistently maintain alertness, sustain attention and persist to task. She did not exhibit signs of physical or emotional discomfort.  She did not report or display problems with vision (she wore her eyeglasses), hearing or motor control. She seemed to comprehend task instructions without difficulty. There were no indications of careless or impulsive responding. She seemed to try her best.   Her premorbid intellectual potential was estimated to fall within the Average range based on her educational/vocational background coupled with a measure of word reading skill (Wide Range Achievement Test-4: Word Reading) that represents an over-learned verbal skill that is relatively resistant to the effects of neurological injury or illness.   A listing of her test scores can be found at the end of this report.   Attention & Executive Function  Her performances on tests of processing speed were variable. On the one hand, she performed within the Average range on a test that measured her speed to transcribe numbers to match symbols using a key (Wechsler Adult Intelligence Scale-IV (WAIS-IV) Coding). In  contrast, her speed to connect numbers  randomly arrayed on a page in numerical sequence (Trails A) was well within the impaired range.   Her immediate auditory attention span, as measured by her ability to repeat strings of digits forward (WAIS-IV Digit Span), was within the Average range. In contrast, her performances on tests of auditory working memory that required her to mentally rearrange randomized digit sequences either in reverse or ascending order (WAIS-IV Digit Span) were erratic. Her immediate recall of a series of symbols in order from left to right (Wechsler Memory Scale-IV (WMS-IV) Symbol Span), a measure of visual working memory, was within the Low Average range.   She struggled on tests that required cognitive flexibility. She was essentially unable to perform a test of set shifting that required maintaining an alternating and ascending sequence of  numbers and letters (Trails B). Her fluent verbal production of words to designated letters (Controlled Oral Word Association Test) or members of a category (Animal Naming Test), measures of efficiency of retrieval from semantic lexicon while tracking prior responses, was impaired.  Her ability to form and express abstract verbal ideas (WAIS-IV Similarities) was well-preserved within the Average range. She performed within the Low Average range on a perceptual reasoning task that required the identification of the missing element that would complete an abstract sequence or matrix of designs (WAIS-IV Matrix Reasoning).  Her ability to apply her reasoning ability to solve novel problems was deficient. Administration of a problem-solving task [employer]) was discontinued after she was unable to complete the initial category after multiple trials. While she could verbalize abstract sorting principles she could not apply them in a systematic or consistent manner.    Learning & Memory On the WMS-IV Older Adult Battery, a measure of  her ability to learn and retain orally-presented information (WMS-IV Auditory Memory Index) fell at the midpoint of the Average range. Both her auditory immediate and delayed subtest scores were uniformly within the Average range. In contrast, a measure of her ability to learn and retain visual information (WMS-IV Visual Memory Index) fell within the impaired range at the 1st percentile. Her deficiency for visual memory was most evident at the stage of acquisition as her delayed recall was as expected given her initial level of encoding. Her ability to recognize visual information from multiple choices was normal, which suggested that she can learn and retain much more visual information than she can freely recall. Overall, a composite measure of her delayed memory for both auditory and visual information (WMS-IV Delayed Memory Index), as assessed after an approximate thirty minute delay interval, fell within the Average range at the 27th percentile.  Language Her ability to name drawings of common objects [facility] [employer]) was below expectations within the lower end of the Low Average range. For example she could not name drawings of a camel, snail, dart or globe. Her phonemic fluency (Controlled Oral Word Association Test) was within the mildly impaired range. Her semantic fluency (Animal Naming Test) was seriously impaired as she could generate only four animals within a one minute period. Her score on a test of comprehension of complex spoken language (Complex Ideational Subtest) was within the mildly impaired range as she made errors while answering yes/no questions about short stories read to her. Her word reading skill (Wide Range Achievement Test-4) was within the Average range.  Visual Spatial Constructional There were no signs of visual-spatial inattention or visual perceptual defect. Her ability to assemble two-dimensional block designs from models Product manager) fell within the  borderline/mildly impaired range. She demonstrated impairment of constructional praxis based on her deficient drawings of a geometric design with recurrent features and of a spatially complex design (Rey Complex Figure). She was able to accurately draw simple geometric figures.  Emotional Functioning  On the Geriatric Depression Scale (short version), her score of 2/15 was not suggestive of depression. The two items she endorsed were not feeling full of energy and preferring to stay at home rather than going out.    Summary & Conclusions Brooke [name] is an 81 year-old woman who displayed changes in cognitive functioning and gait shortly after she underwent paraesophageal hiatal hernia surgery in January 2013. She cited her chief problems as difficulty with word finding, reduced energy, decreased handwriting legibility and "loss of confidence". Her husband agreed with her report and added that she has displayed a small-step, shuffling gait over the past three years. Her son has also noticed this change in her gait as well as a decline in her short-term memory after the hernia surgery. All agreed that her symptoms gradually worsened until she began taking donepezil in 2014, at which time her cognitive functioning appeared to stabilize and possibly improve slightly. She was currently described as independently performing her basic and instrumental activities of daily living.   Neuropsychological testing identified deficits in multiple cognitive areas. She demonstrated prominent deficits on measures of executive function (especially cognitive flexibility and problem-solving), visual memory (especially acquisition and retrieval) and visual-constructional abilities. Mild deficits were demonstrated on tests of auditory working memory, phonemic fluency and comprehension of complex spoken language. Measures of visual working memory span, naming to confrontation and nonverbal reasoning fell within the Low  Average range. Her well-preserved abilities, which fell within the Average range, were auditory-verbal memory and verbal reasoning.   There was no report or display of mood disorder, behavioral disturbance, problems with psychosocial adjustment or concerns about judgment. Her recent tendency to avoid social activities likely reflects her effort to avoid feeling embarrassed secondary to her awareness of word finding difficulties.   In conclusion, her reported well-preserved everyday living abilities coupled with indications of multiple neuropsychological deficits would be consistent with a classification of Mild Cognitive Impairment-Multiple Domains. The extent and degree of her neuropsychological deficits, however, would predict that she would have more difficulty with everyday functioning than was reported by herself and family. It is likely that her intact verbal memory and social skills somewhat conceal her degree of underlying cognitive impairment to most observers.   Diagnostic Impression Mild Cognitive Impairment, Multiple Domains [G31.84]  Recommendations 1. 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 It is advisable that she not be involved with financial transactions, either at home or at work.   2. She was encouraged to regularly exercise including aerobic activities and to maintain her participation in social activities as ways to possibly forestall further cognitive decline.  3. Her cognitive functioning should be monitored. A repeat neuropsychological evaluation, perhaps in one year to eighteen months (or sooner if clinically indicated), is recommended as a means to track her cognitive functioning.  The results and recommendations from this evaluation were discussed with Brooke [name], her husband and her son on 09/02/15.   I have appreciated the opportunity to evaluate Brooke [name]. Please  feel free to contact me with any comments or questions.    __________________ Antionette Poles, Ph.D Licensed Psychologist      ADDENDUM-NEUROPSYCHOLOGICAL  TEST RESULTS  Her test scores were corrected to reflect norms for her [name] and, whenever possible, her gender and educational level (i.e., 12 years).   Animal Naming Test Score=  4 <1st (adjusted for [name], gender and educational level)    Boston Naming Test Score=34 /60 10th (adjusted for [name], gender and educational level)    Complex Ideational Material subtest of the Boston Aphasia Examination Score= 10/12  7th  (adjusted for [name], gender, race and educational level)    Controlled Oral Word Association Test Score=  20 words/0 repetitions  5th (adjusted for [name], gender and educational level)    Rey Complex Figure: copy       Score= 30/36  Impaired     Trails A Score=   90s  0e   1st  (adjusted for [name], gender and educational level)  Trails B Score= discontinued after 300s and 5 errors N/A    Wechsler Adult Intelligence Scale-IV   Subtest Scaled Score Percentile  Block Design   5   5th    Similarities   9 37th   Digit Span  Forward               Backward               Sequencing   5   9   7   1   5th       37th   16th   <1st     Matrix Reasoning   7 16th     Coding     9 37th      Wechsler Memory Scale-IV Older Adult Battery  Index Index Score Percentile  Immediate Memory   83 13th    Auditory Memory 100 50th    Visual Memory   66   1st     Delayed Memory   91 27th     Symbol Span Scaled score= 7 16th       Wide Range Achievement Test-4 Subtest  Raw score Standard score Percentile  Word Reading 48/70 92 30th      [employer] Test- discontinued

## 2015-09-09 DIAGNOSIS — J301 Allergic rhinitis due to pollen: Secondary | ICD-10-CM | POA: Diagnosis not present

## 2015-09-09 DIAGNOSIS — G3184 Mild cognitive impairment, so stated: Secondary | ICD-10-CM | POA: Diagnosis not present

## 2015-09-09 DIAGNOSIS — Z0000 Encounter for general adult medical examination without abnormal findings: Secondary | ICD-10-CM | POA: Diagnosis not present

## 2015-09-09 DIAGNOSIS — M810 Age-related osteoporosis without current pathological fracture: Secondary | ICD-10-CM | POA: Diagnosis not present

## 2015-09-09 DIAGNOSIS — D509 Iron deficiency anemia, unspecified: Secondary | ICD-10-CM | POA: Diagnosis not present

## 2015-09-09 DIAGNOSIS — L309 Dermatitis, unspecified: Secondary | ICD-10-CM | POA: Diagnosis not present

## 2015-09-09 DIAGNOSIS — I10 Essential (primary) hypertension: Secondary | ICD-10-CM | POA: Diagnosis not present

## 2015-09-09 DIAGNOSIS — K219 Gastro-esophageal reflux disease without esophagitis: Secondary | ICD-10-CM | POA: Diagnosis not present

## 2015-09-09 DIAGNOSIS — J069 Acute upper respiratory infection, unspecified: Secondary | ICD-10-CM | POA: Diagnosis not present

## 2015-09-09 DIAGNOSIS — R809 Proteinuria, unspecified: Secondary | ICD-10-CM | POA: Diagnosis not present

## 2015-09-09 DIAGNOSIS — E782 Mixed hyperlipidemia: Secondary | ICD-10-CM | POA: Diagnosis not present

## 2015-10-08 DIAGNOSIS — Z23 Encounter for immunization: Secondary | ICD-10-CM | POA: Diagnosis not present

## 2015-11-03 ENCOUNTER — Other Ambulatory Visit: Payer: Self-pay | Admitting: Neurology

## 2015-11-18 ENCOUNTER — Ambulatory Visit (INDEPENDENT_AMBULATORY_CARE_PROVIDER_SITE_OTHER): Payer: Medicare Other | Admitting: Neurology

## 2015-11-18 ENCOUNTER — Encounter: Payer: Self-pay | Admitting: Neurology

## 2015-11-18 VITALS — BP 126/62 | HR 66 | Resp 16 | Ht 61.0 in | Wt 160.0 lb

## 2015-11-18 DIAGNOSIS — R9082 White matter disease, unspecified: Secondary | ICD-10-CM

## 2015-11-18 DIAGNOSIS — F0390 Unspecified dementia without behavioral disturbance: Secondary | ICD-10-CM

## 2015-11-18 DIAGNOSIS — R930 Abnormal findings on diagnostic imaging of skull and head, not elsewhere classified: Secondary | ICD-10-CM

## 2015-11-18 MED ORDER — DONEPEZIL HCL 10 MG PO TABS: 10.0000 mg | ORAL_TABLET | Freq: Every day | ORAL | Status: DC

## 2015-11-18 NOTE — Progress Notes (Signed)
Subjective:    Patient ID: Brooke [name] is a 81 y.o. female.  HPI     Interim history:   Ms. [name] is an 81 year old right-handed woman with an underlying medical history of hypertension, reflux disease, depression, allergies, osteoporosis, status post hiatal hernia repair, who presents for follow-up consultation of her memory loss. The patient is accompanied by her husband again today. I last saw her on 05/15/2015, at which time the patient was able to give a fairly good history and reported doing a little better as far as her memory. She was on Aricept 5 mg daily generic. She was not drinking enough water. Her husband felt that she was not as interactive and as. She is not exercising regularly. She had reduced her wine to 1 glass per day. Her MMSE was 21 out of 30, clock drawing was 2 out of 4, animal fluency was 6/m at her last visit. I suggested we increase her Aricept to 10 mg once daily and proceed with formal cognitive testing through neuropsychology. She saw Dr. Valentina Shaggy on 08/22/2015 as well as 09/02/2015 and I reviewed the neuropsychological evaluation report:  Impression: Mild Cognitive Impairment, Multiple Domains [G31.84]  Recommendations 1. 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 It is advisable that she not be involved with financial transactions, either at home or at work.    2. She was encouraged to regularly exercise including aerobic activities and to maintain her participation in social activities as ways to possibly forestall further cognitive decline.    3. Her cognitive functioning should be monitored. A repeat neuropsychological evaluation, perhaps in one year to eighteen months (or sooner if clinically indicated), is recommended as a means to track her cognitive functioning. Today, 11/18/2015: She reports doing quite well. She has been able to tolerate Aricept 10 mg  once daily. She has been driving, usually only during daylight times and local roads only. She [place] not [place]. She does not always drink enough water and has not been exercising very much at all. She eats quite well she feels. She drinks wine, 1-1/2 glass per day. She does like to drink sodas still. Her husband and son additional information. He feels that she's doing rather well.  Previously:   I first met her on 07/15/2014 at the request of her primary care physician, at which time she reported an approximately one-year history of memory loss. She had previously had blood work and a brain MRI and was taking Aricept 5 mg once daily. I asked her to continue this. We talked about potentially pursuing formal cognitive testing with a neuropsychological evaluation but mutually decided to hold off. Her MMSE was 26 at the time. Of note, she no showed for an appointment on 01/14/2015.  She has had memory loss for the past year. Per family, this has been more progressive in the last few months. Blood work from August 2014 was reviewed, this included CMP which was unremarkable and cholesterol panel which showed LDL of 108. She was started on Aricept in August of last year at 5 mg strength.   She had a brain MRI with and without contrast on 06/21/2014: 1. No evidence of acute intracranial abnormality or mass. 2. Moderate chronic small vessel ischemic disease. In addition, personally reviewed the images through the PACS system.   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 She felt overall improved and was not keen on trying anything new.   She has been taking perphenazine/amitriptyline 2 pills each night for sleep.   She has no VH/AH and no delusion and she drives without issues.   She denies depression. She has one son.   The patient denies  prior TIA or stroke symptoms, such as sudden onset of one sided weakness, numbness, tingling, slurring of speech or droopy face, hearing loss, tinnitus, diplopia or visual field cut or monocular loss of vision, and denies recurrent headaches.   Of note, the patient reports snoring, and there is no report of witnessed apneas or choking sensations while asleep. she has been sleeping semi-upright since her hiatal hernia surgery 2 years ago.   She drinks wine 1 glass a day, she drinks coffee 1 cup in the morning and 1 soda per day. She is a non-smoker.   She has a B12 check and RPR in 1/13. She had a sister with AD (died at age 9 or 55) and a MA with AD (died in her early 77s).   Her Past Medical History Is Significant For: Past Medical History  Diagnosis Date  . Diverticulosis   . Osteoporosis   . Hypertension   . Iron deficiency anemia   . Cameron lesion, acute   . AVM (arteriovenous malformation)   . GERD (gastroesophageal reflux disease)   . Internal hemorrhoid   . Hiatal hernia   . Diverticulosis   . Cough   . Paraesophageal hernia   . Arthritis     hands   . History of hypokalemia   . Hyperlipidemia   . History of colon polyps   . Paraesophageal hernia     history of  . H/O: GI bleed   . H/O: hematuria   . Praesophageal hiatal hernia - giant, irreducible 10/20/2011    Her Past Surgical History Is Significant For: Past Surgical History  Procedure Laterality Date  . Colonscopy    . Polypectomy    . Cesarean section  01/1965  . Dilation and curettage of uterus    . Hernia repair  12/2011  . Laparoscopic nissen fundoplication  9/52/8413    Procedure: LAPAROSCOPIC NISSEN FUNDOPLICATION;  Surgeon: Adin Hector, MD;  Location: [facility];  Service: General;  Laterality: N/A;  laparoscopic paraesphogeal hernia repair and fundoplication    Her Family History Is Significant For: Family History  Problem Relation Age of Onset  . Prostate cancer Brother   . Cancer Brother      prostate, and skin  . Colon cancer Neg Hx     Her Social History Is Significant For: Social History   Social History  . Marital Status: Married    Spouse Name: N/A  . Number of Children: 1  . Years of Education: N/A   Occupational History  . Business owner     [employer]   Social History Main Topics  . Smoking status: Never Smoker   . Smokeless tobacco: Never Used  . Alcohol Use: 10.8 oz/week    18 Glasses of wine per week     Comment: 1 glasses daily  . Drug Use: No  . Sexual Activity: No   Other Topics Concern  . None   Social History Narrative   2-3 cups of coffee a day     Her Allergies Are:  Allergies  Allergen Reactions  . Oxycontin [Oxycodone Hcl]   :   Her Current Medications Are:  Outpatient Encounter Prescriptions as of 11/18/2015  Medication Sig  . donepezil (ARICEPT) 10 MG tablet TAKE 1 TABLET (10 MG TOTAL) BY MOUTH AT BEDTIME.  . hyoscyamine (LEVSIN) 0.125 MG/5ML ELIX Take 0.125 mg by mouth every 6 (six) hours as needed.  . metoprolol (TOPROL-XL) 50 MG 24 hr tablet Take 50 mg by mouth daily after breakfast.   . perphenazine-amitriptyline (ETRAFON/TRIAVIL) 2-10 MG TABS Take 2 tablets by mouth at bedtime.   . simvastatin (ZOCOR) 20 MG tablet Take 20 mg by mouth at bedtime.    No facility-administered encounter medications on file as of 11/18/2015.  :  Review of Systems:  Out of a complete 14 point review of systems, all are reviewed and negative with the exception of these symptoms as listed below:   Review of Systems  Neurological:       Patient is here for f/u, no new concerns.     Objective:  Neurologic Exam  Physical Exam Physical Examination:   Filed Vitals:   11/18/15 1145  BP: 126/62  Pulse: 66  Resp: 16   General Examination: The patient is a very pleasant 81 y.o. female in no acute distress. She is calm and cooperative with the exam. She is in good spirits today.   HEENT: Normocephalic, atraumatic, pupils are equal,  round and reactive to light and accommodation. She has mild bilateral cataracts. Extraocular tracking shows no significant saccadic breakdown without nystagmus noted. Hearing is intact. Face is symmetric with no facial masking and normal facial sensation. There is no lip, neck or jaw tremor. Neck is not rigid with intact passive ROM. There are no carotid bruits on auscultation. Oropharynx exam reveals mild mouth dryness. No significant airway crowding is noted. Mallampati is class III. Tongue protrudes centrally and palate elevates symmetrically.    Chest: is clear to auscultation without wheezing, rhonchi or crackles noted.  Heart: sounds are regular and normal without murmurs, rubs or gallops noted.   Abdomen: is soft, non-tender and non-distended with normal bowel sounds appreciated on auscultation.  Extremities: There is no pitting edema in the distal lower extremities bilaterally. Pedal pulses are intact.   Skin: is warm and dry with no trophic changes noted. Age-related changes are noted on the skin.   Musculoskeletal: exam reveals no obvious joint deformities, tenderness or joint swelling or erythema.   Neurologically:  Mental status: The patient is awake and alert, paying good  attention. She is able to provide the history. Her husband provides details. She is oriented to: person, place, time/date, situation, day of week, month of year and year. Her memory, attention, language and knowledge are very mildly. There is no aphasia, agnosia, apraxia or anomia. There is a no significant degree of bradyphrenia. Speech is not hypophonic with no dysarthria noted. Mood is congruent and affect is normal.   On 07/15/14: Her MMSE (Mini-Mental state exam) score is 26/30. CDT (Clock Drawing Test) score is 3/4. AFT (Animal Fluency Test) score is 6.   On 05/15/2015: MMSE: 21/30, CDT: 2/4, AFT: 6/min.  On 11/18/2015: MMSE: 23/30, CDT: 2/4, AFT: 5/min.  Cranial nerves are as described above under HEENT  exam. In addition, shoulder shrug is normal with equal shoulder height noted.  Motor exam: Normal bulk, and strength for age is noted. Tone is not rigid with absence of cogwheeling in the extremities. There is overall no significant bradykinesia. There is no drift or rebound.  There is no tremor.  Romberg is negative. Reflexes are 1+ in the upper extremities and 1+ in the lower extremities. Toes are downgoing bilaterally. Fine motor skills: Finger taps, hand movements, and rapid alternating patting are not impaired bilaterally. Foot taps and foot agility are not impaired bilaterally.   Cerebellar testing shows no dysmetria or intention tremor on finger to nose testing. Heel to shin is unremarkable. There is no truncal or gait ataxia.   Sensory exam is intact to light touch in the upper and lower extremities.   Gait, station and balance: She stands up from the seated position with no significant difficulty and needs no assistance. No veering to one side is noted. No leaning to one side. Posture is age-appropriate. Stance is narrow-based. She turns in 3 steps. Tandem walk is slightly difficult for her, better than last time.     Assessment and Plan:  In summary, SAMAYRA HEBEL is  a very pleasant 81 year old female with an underlying medical history of hypertension, reflux disease, depression, allergies, osteoporosis, status post hiatal hernia repair, who has had memory loss for the past 18 + months. Her history and findings are in keeping with mild Alzheimer's dementia, without behavioral disturbances. Her recent neuropsychological evaluation was in keeping with mild cognitive impairment. We may consider repeat testing 12-18 months after her exam from September 2016. She has been on Aricept generic 10 mg once daily and has been tolerating this well. Her memory scores have been largely stable which is reassuring. Her physical exam is stable. We again talked about her driving. She drives fairly well at  this time. She drives locally and during daylight. We will reevaluate as we go along. She has not been exercising regularly and does not drink enough water. She  is again advised to increase her water intake and limit her soda intake and to limit herself to 1 glass of wine per day. She is encouraged to exercise in the form of walking. She had a brain MRI with and without contrast on 06/21/2014, which showed no evidence of acute intracranial abnormality or mass and moderate chronic small vessel ischemic disease.  We talked about maintaining a healthy lifestyle in general and staying active mentally and physically. I encouraged the patient to eat healthy, exercise daily and keep well hydrated, to keep a scheduled bedtime and wake time routine, to not skip any meals and eat healthy snacks in between meals and to have protein with every meal. I stressed the importance of regular exercise, within of course the patient's own mobility limitations. I encouraged the patient to keep up with current events by reading the news paper or watching the news.   As far as further diagnostic testing is concerned, I suggested the following: no new test at this time.  As far as medications are concerned, I recommended the following at this time: continue donepezil 10 mg once daily; I renewed her prescription.   I would like to see her back in 6 months, sooner if the need arises.  I answered all their questions today and the patient and her husband were in agreement. I encouraged them to call with any interim questions, concerns, problems, or updates. I spent 25 minutes in total face-to-face time with the patient, more than 50% of which was spent in counseling and coordination of care, reviewing test results, reviewing medication and discussing or reviewing the diagnosis of dementia, its prognosis and treatment options.

## 2015-11-18 NOTE — Patient Instructions (Signed)
I think overall you are doing fairly well but I do want to suggest a few things today:  Remember to drink plenty of fluid, eat healthy meals and do not skip any meals. Try to eat protein with a every meal and eat a healthy snack such as fruit or nuts in between meals. Try to keep a regular sleep-wake schedule and try to exercise daily, particularly in the form of walking, 20-30 minutes a day, if you can. Good nutrition, proper sleep and exercise can help her cognitive function.  Engage in social activities in your community and with your family and try to keep up with current events by reading the newspaper or watching the news. If you have computer and can go online, try [email or web address]. Also, you may like to do word finding puzzles or crossword puzzles.  As far as your medications are concerned, I would like to suggest no new changes.    I would like to see you back in 6 months, sooner if we need to. Please call us with any interim questions, concerns, problems, updates or refill requests.  Please also call us for any test results so we can go over those with you on the phone. Richardson Landry is my clinical assistant and will answer any of your questions and relay your messages to me and also relay most of my messages to you.  Our phone number is [phone]. We also have an after hours call service for urgent matters and there is a physician on-call for urgent questions. For any emergencies you know to call 911 or go to the nearest emergency room.

## 2015-11-24 DIAGNOSIS — M810 Age-related osteoporosis without current pathological fracture: Secondary | ICD-10-CM | POA: Diagnosis not present

## 2015-12-18 DIAGNOSIS — H2513 Age-related nuclear cataract, bilateral: Secondary | ICD-10-CM | POA: Diagnosis not present

## 2015-12-18 DIAGNOSIS — H02834 Dermatochalasis of left upper eyelid: Secondary | ICD-10-CM | POA: Diagnosis not present

## 2015-12-18 DIAGNOSIS — H02831 Dermatochalasis of right upper eyelid: Secondary | ICD-10-CM | POA: Diagnosis not present

## 2016-02-03 ENCOUNTER — Encounter: Payer: Self-pay | Admitting: Gastroenterology

## 2016-03-11 DIAGNOSIS — I10 Essential (primary) hypertension: Secondary | ICD-10-CM | POA: Diagnosis not present

## 2016-03-11 DIAGNOSIS — G3184 Mild cognitive impairment, so stated: Secondary | ICD-10-CM | POA: Diagnosis not present

## 2016-03-11 DIAGNOSIS — E782 Mixed hyperlipidemia: Secondary | ICD-10-CM | POA: Diagnosis not present

## 2016-04-20 DIAGNOSIS — H02834 Dermatochalasis of left upper eyelid: Secondary | ICD-10-CM | POA: Diagnosis not present

## 2016-04-20 DIAGNOSIS — H2513 Age-related nuclear cataract, bilateral: Secondary | ICD-10-CM | POA: Diagnosis not present

## 2016-04-20 DIAGNOSIS — H02831 Dermatochalasis of right upper eyelid: Secondary | ICD-10-CM | POA: Diagnosis not present

## 2016-05-04 ENCOUNTER — Encounter: Payer: Self-pay | Admitting: Neurology

## 2016-05-18 ENCOUNTER — Ambulatory Visit: Payer: Medicare Other | Admitting: Neurology

## 2016-05-24 ENCOUNTER — Other Ambulatory Visit: Payer: Self-pay | Admitting: Neurology

## 2016-05-25 DIAGNOSIS — M810 Age-related osteoporosis without current pathological fracture: Secondary | ICD-10-CM | POA: Diagnosis not present

## 2016-05-26 ENCOUNTER — Other Ambulatory Visit: Payer: Self-pay | Admitting: Neurology

## 2016-06-30 ENCOUNTER — Ambulatory Visit (INDEPENDENT_AMBULATORY_CARE_PROVIDER_SITE_OTHER): Payer: Medicare Other | Admitting: Neurology

## 2016-06-30 ENCOUNTER — Encounter: Payer: Self-pay | Admitting: Neurology

## 2016-06-30 ENCOUNTER — Telehealth: Payer: Self-pay | Admitting: Neurology

## 2016-06-30 VITALS — BP 124/78 | HR 70 | Resp 16 | Ht 61.0 in | Wt 154.0 lb

## 2016-06-30 DIAGNOSIS — F0280 Dementia in other diseases classified elsewhere without behavioral disturbance: Secondary | ICD-10-CM

## 2016-06-30 DIAGNOSIS — G301 Alzheimer's disease with late onset: Secondary | ICD-10-CM | POA: Diagnosis not present

## 2016-06-30 MED ORDER — MEMANTINE HCL 10 MG PO TABS: 5.0000 mg | ORAL_TABLET | Freq: Two times a day (BID) | ORAL | Status: DC

## 2016-06-30 MED ORDER — MEMANTINE HCL ER 7 MG PO CP24: 7.0000 mg | ORAL_CAPSULE | Freq: Every day | ORAL | Status: DC

## 2016-06-30 NOTE — Patient Instructions (Signed)
Your memory scores are for the most part stable.  I do agree, that you would benefit from exercise. We will provide a letter of support for your insurance as requested. We will mail the letter to your home address.   I would like to suggest starting you on a second memory medication, Namenda XR, starting at 7 mg once daily with gradual build up. Side effects include: nausea, confusion, hallucination, personality changes. If you are having mild side effects, try to stick with the treatment as these initial side effects may go away after the first 10-14 days.     Let's do a follow-up in a couple of months with one of our nurse practitioners, that way we can make sure that you are  tolerating to medications for your memory well and we can increase gradually to the next dose of 14 mg for the new medication at the time. I will see you back after that.

## 2016-06-30 NOTE — Telephone Encounter (Signed)
Generic Namenda immediate release 10 mg strength, take half pill twice daily until next appointment with nurse practitioner. Rx sent to pharm.

## 2016-06-30 NOTE — Telephone Encounter (Signed)
How would you like to proceed? 

## 2016-06-30 NOTE — Telephone Encounter (Signed)
[place] [phone] called sts memantine (NAMENDA XR) 7 MG CP24 24 hr capsule is not on formulary with pt's insurance. She said medication may not be covered even with PA.

## 2016-06-30 NOTE — Progress Notes (Signed)
Subjective:    Patient ID: Brooke [name] is a 82 y.o. female.  HPI     Interim history:   Brooke [name] is an 82 year old right-handed woman with an underlying medical history of hypertension, reflux disease, depression, allergies, osteoporosis, status post hiatal hernia repair, who presents for follow-up consultation of her memory loss. The patient is accompanied by her husband again today. I last saw her on 11/18/2015, at which time she reported doing quite well. She was able to tolerate Aricept 10 mg daily. She was driving, only during daylight times and local roads only. She was no longer driving on the eye ways to enter states. She was not always drink enough water. She is not exercising very much at all. Her MMSE was 23/30, CDT: 2/4, AFT: 5/min. I suggested we continue with generic Aricept 10 mg daily.  Today, 06/30/2016: She reports doing well. She drives locally only. Her husband takes her to her son's office 3 times a week and helps in the office, answering phone calls. 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 She would like to participate in home exercises, which would be sponsored by her insurance. She has not fallen, thankfully.   Previously:  I saw her on 05/15/2015, at which time the patient was able to give a fairly good history and reported doing a little better as far as her memory. She was on Aricept 5 mg daily generic. She was not drinking enough water. Her husband felt that she was not as interactive and as. She is not exercising regularly. She had reduced her wine to 1 glass per day. Her MMSE was 21 out of 30, clock drawing was 2 out of 4, animal fluency was 6/m at her last visit. I suggested we increase her Aricept to 10 mg once daily and proceed with formal cognitive testing through neuropsychology.  She saw Dr. Valentina Shaggy on 08/22/2015 as well as 09/02/2015 and I reviewed the neuropsychological evaluation report:   Impression: Mild Cognitive Impairment, Multiple  Domains [G31.84]  Recommendations 1.   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 It is advisable that she not be involved with financial transactions, either at home or at work.    2.   She was encouraged to regularly exercise including aerobic activities and to maintain her participation in social activities as ways to possibly forestall further cognitive decline.    3.         Her cognitive functioning should be monitored. A repeat neuropsychological evaluation, perhaps in one year to eighteen months (or sooner if clinically indicated), is recommended as a means to track her cognitive functioning.  I first met her on 07/15/2014 at the request of her primary care physician, at which time she reported an approximately one-year history of memory loss. She had previously had blood work and a brain MRI and was taking Aricept 5 mg once daily. I asked her to continue this. We talked about potentially pursuing formal cognitive testing with a neuropsychological evaluation but mutually decided to hold off. Her MMSE was 26 at the time. Of note, she no showed for an appointment on 01/14/2015.  She has had memory loss for the past year. Per family, this has been more progressive in the last few months. Blood work from August 2014 was reviewed, this included CMP which was unremarkable and cholesterol panel which showed LDL of 108. She  was started on Aricept in August of last year at 5 mg strength.   She had a brain MRI with and without contrast on 06/21/2014: 1. No evidence of acute intracranial abnormality or mass. 2. Moderate chronic small vessel ischemic disease. In addition, personally reviewed the images through the PACS system.   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  She felt overall improved and was not keen on trying anything new.   She has been taking perphenazine/amitriptyline 2 pills each night for sleep.   She has no VH/AH and no delusion and she drives without issues.   She denies depression. She has one son.   The patient denies prior TIA or stroke symptoms, such as sudden onset of one sided weakness, numbness, tingling, slurring of speech or droopy face, hearing loss, tinnitus, diplopia or visual field cut or monocular loss of vision, and denies recurrent headaches.   Of note, the patient reports snoring, and there is no report of witnessed apneas or choking sensations while asleep. she has been sleeping semi-upright since her hiatal hernia surgery 2 years ago.   She drinks wine 1 glass a day, she drinks coffee 1 cup in the morning and 1 soda per day. She is a non-smoker.   She has a B12 check and RPR in 1/13. She had a sister with AD (died at age 63 or 71) and a MA with AD (died in her early 29s).    Her Past Medical History Is Significant For: Past Medical History  Diagnosis Date  . Diverticulosis   . Osteoporosis   . Hypertension   . Iron deficiency anemia   . Cameron lesion, acute   . AVM (arteriovenous malformation)   . GERD (gastroesophageal reflux disease)   . Internal hemorrhoid   . Hiatal hernia   . Diverticulosis   . Cough   . Paraesophageal hernia   . Arthritis     hands   . History of hypokalemia   . Hyperlipidemia   . History of colon polyps   . Paraesophageal hernia     history of  . H/O: GI bleed   . H/O: hematuria   . Praesophageal hiatal hernia - giant, irreducible 10/20/2011    Her Past Surgical History Is Significant For: Past Surgical History  Procedure Laterality Date  . Colonscopy    . Polypectomy    . Cesarean section  01/1965  . Dilation and curettage of uterus    . Hernia repair  12/2011  . Laparoscopic nissen fundoplication  3/71/0626    Procedure: LAPAROSCOPIC NISSEN FUNDOPLICATION;  Surgeon: Adin Hector, MD;  Location: [facility];  Service: General;  Laterality: N/A;  laparoscopic paraesphogeal hernia repair and fundoplication    Her Family History Is Significant For: Family History  Problem Relation Age of Onset  . Prostate cancer Brother   . Cancer Brother     prostate, and skin  . Colon cancer Neg Hx     Her Social History Is Significant For: Social History   Social History  . Marital Status: Married    Spouse Name: N/A  . Number of Children: 1  . Years of Education: N/A   Occupational History  . Business owner     [employer]   Social History Main Topics  . Smoking status: Never Smoker   . Smokeless tobacco:  Never Used  . Alcohol Use: 10.8 oz/week    18 Glasses of wine per week     Comment: 1 glasses daily  . Drug Use: No  . Sexual Activity: No   Other Topics Concern  . None   Social History Narrative   2-3 cups of coffee a day     Her Allergies Are:  Allergies  Allergen Reactions  . Oxycontin [Oxycodone Hcl]   :   Her Current Medications Are:  Outpatient Encounter Prescriptions as of 06/30/2016  Medication Sig  . donepezil (ARICEPT) 10 MG tablet TAKE 1 TABLET (10 MG TOTAL) BY MOUTH AT BEDTIME.  . hyoscyamine (LEVSIN) 0.125 MG/5ML ELIX Take 0.125 mg by mouth every 6 (six) hours as needed.  . metoprolol (TOPROL-XL) 50 MG 24 hr tablet Take 50 mg by mouth daily after breakfast.   . perphenazine-amitriptyline (ETRAFON/TRIAVIL) 2-10 MG TABS Take 2 tablets by mouth at bedtime.   . simvastatin (ZOCOR) 20 MG tablet Take 20 mg by mouth at bedtime.    No facility-administered encounter medications on file as of 06/30/2016.  :  Review of Systems:  Out of a complete 14 point review of systems, all are reviewed and negative with the exception of these symptoms as listed below:   Review of Systems  Neurological:       No new concerns per patient.   Husband states that insurance will pay for in home exercise program, they just need a note from the doctor.      Objective:  Neurologic Exam  Physical Exam Physical Examination:   Filed Vitals:   06/30/16 0918  BP: 124/78  Pulse: 70  Resp: 16   General Examination: The patient is a very pleasant 82 y.o. female in no acute distress. She is calm and cooperative with the exam. She is in good spirits today.    HEENT: Normocephalic, atraumatic, pupils are equal, round and reactive to light and accommodation. She has mild bilateral cataracts. Extraocular tracking shows no significant saccadic breakdown without nystagmus noted. Hearing is intact. Face is symmetric with no facial masking and normal facial sensation. There is no lip, neck or jaw tremor. Neck is not rigid with intact passive ROM. There are no carotid bruits on auscultation. Oropharynx exam reveals mild mouth dryness. No significant airway crowding is noted. Mallampati is class III. Tongue protrudes centrally and palate elevates symmetrically.    Chest: is clear to auscultation without wheezing, rhonchi or crackles noted.  Heart: sounds are regular and normal without murmurs, rubs or gallops noted.   Abdomen: is soft, non-tender and non-distended with normal bowel sounds appreciated on auscultation.  Extremities: There is no pitting edema in the distal lower extremities bilaterally. Pedal pulses are intact.   Skin: is warm and dry with no trophic changes noted. Age-related changes are noted on the skin.   Musculoskeletal: exam reveals no obvious joint deformities, tenderness or joint swelling or erythema, except for L foot flat footed and pointing out.   Neurologically:  Mental status: The patient is awake and alert, paying good  attention. She is able to provide the history. Her husband provides details. She is oriented to: person, place, time/date, situation, day of week, month of year and year. Her memory, attention, language and knowledge are very mildly. There is no aphasia, agnosia, apraxia or anomia. There is a no significant  degree of bradyphrenia. Speech is not hypophonic with no dysarthria noted. Mood is congruent and affect is normal.   On 07/15/14: Her MMSE (  Mini-Mental state exam) score is 26/30. CDT (Clock Drawing Test) score is 3/4. AFT (Animal Fluency Test) score is 6.   On 05/15/2015: MMSE: 21/30, CDT: 2/4, AFT: 6/min.  On 11/18/2015: MMSE: 23/30, CDT: 2/4, AFT: 5/min.  06/30/2016: MMSE: 21/30, CDT: 3/4, AFT: 6/min.  Cranial nerves are as described above under HEENT exam. In addition, shoulder shrug is normal with equal shoulder height noted.  Motor exam: Normal bulk, and strength for age is noted. Tone is not rigid with absence of cogwheeling in the extremities. There is overall no significant bradykinesia. There is no drift or rebound. There is no tremor.  Romberg is negative. Reflexes are 1+ in the upper extremities and 1+ in the lower extremities. Toes are downgoing bilaterally. Fine motor skills: mildly slow in the UEs and LEs.   Cerebellar testing shows no dysmetria or intention tremor on finger to nose testing. Heel to shin is unremarkable. There is no truncal or gait ataxia.   Sensory exam is intact to light touch in the upper and lower extremities.   Gait, station and balance: She stands up from the seated position with no significant difficulty and needs no assistance. No veering to one side is noted. No leaning to one side. Posture is age-appropriate. Stance is narrow-based. She turns in 3 steps. Abnormal L foot position.   Assessment and Plan:   In summary, Brooke [name] is a very pleasant 82 year old female with an underlying medical history of hypertension, reflux disease, depression, allergies, osteoporosis, status post hiatal hernia repair, who has had memory loss for the past 2 years. Her history and findings are in keeping with Alzheimer's dementia, without behavioral disturbances. Her neuropsychological evaluation in the fall of 2016 was in keeping with mild cognitive impairment. We may  consider repeat testing 12-18 months after her exam from September 2016. She has been on Aricept generic 10 mg once daily and has been tolerating this well. Her memory scores have been largely stable which is reassuring. Her physical exam is stable with the exception of worsening left foot deformity and ankle position. She is advised to seek consultation with a podiatrist. She may benefit from a orthotic. She tries infrequently and locally only, routine trips to the hairdresser etc. Her husband does the groceries and he also takes her to their son's office where she helps out attending phone calls. I would like for her to get started on the second memory medication, Namenda XR 7 mg once daily. We talked about potential side effects and expectations. We will reevaluate in a couple months with one of her nurse practitioners to make sure she tolerates the new medication and consider increasing it at the time to 14 mg daily. She will continue with generic Aricept once daily. I provided a new prescription and written instructions. I also provided a letter of support so she can start a home exercises. She would benefit from physical activity. She is reminded to stay well-hydrated and they are reminded to monitor her driving. She had a brain MRI with and without contrast on 06/21/2014 which showed no evidence of acute intracranial abnormality or mass and moderate chronic small vessel ischemic disease. I would like to see her back in 6 months, sooner if the need arises. She will see the NP in the interim.  I answered all their questions today and the patient and her husband were in agreement. I encouraged them to call with any interim questions, concerns, problems, or updates. I spent 25 minutes in total  face-to-face time with the patient, more than 50% of which was spent in counseling and coordination of care, reviewing test results, reviewing medication and discussing or reviewing the diagnosis of dementia, its  prognosis and treatment options.

## 2016-08-31 ENCOUNTER — Ambulatory Visit: Payer: Medicare Other | Admitting: Nurse Practitioner

## 2016-09-09 ENCOUNTER — Ambulatory Visit (INDEPENDENT_AMBULATORY_CARE_PROVIDER_SITE_OTHER): Payer: Medicare Other | Admitting: Nurse Practitioner

## 2016-09-09 ENCOUNTER — Encounter: Payer: Self-pay | Admitting: Nurse Practitioner

## 2016-09-09 VITALS — BP 138/72 | HR 63 | Ht 61.0 in | Wt 155.6 lb

## 2016-09-09 DIAGNOSIS — G301 Alzheimer's disease with late onset: Secondary | ICD-10-CM

## 2016-09-09 DIAGNOSIS — I10 Essential (primary) hypertension: Secondary | ICD-10-CM

## 2016-09-09 DIAGNOSIS — G3184 Mild cognitive impairment, so stated: Secondary | ICD-10-CM | POA: Diagnosis not present

## 2016-09-09 DIAGNOSIS — F0280 Dementia in other diseases classified elsewhere without behavioral disturbance: Secondary | ICD-10-CM

## 2016-09-09 MED ORDER — DONEPEZIL HCL 10 MG PO TABS: ORAL_TABLET | ORAL | 6 refills | Status: DC

## 2016-09-09 MED ORDER — MEMANTINE HCL 10 MG PO TABS: 10.0000 mg | ORAL_TABLET | Freq: Two times a day (BID) | ORAL | 6 refills | Status: DC

## 2016-09-09 NOTE — Progress Notes (Addendum)
[facility]  PATIENT: Brooke [name] DOB: Jan 08, 1932   REASON FOR VISIT: Follow-up for memory loss HISTORY FROM: patient and husband    HISTORY OF PRESENT ILLNESS: HISTORY SAMs. Sponaugle is an 82 year old right-handed woman with an underlying medical history of hypertension, reflux disease, depression, allergies, osteoporosis, status post hiatal hernia repair, who presents for follow-up consultation of her memory loss. The patient is accompanied by her husband again today. I last saw her on 11/18/2015, at which time she reported doing quite well. She was able to tolerate Aricept 10 mg daily. She was driving, only during daylight times and local roads only. She was no longer driving on the eye ways to enter states. She was not always drink enough water. She is not exercising very much at all. Her MMSE was 23/30, CDT: 2/4, AFT: 5/min. I suggested we continue with generic Aricept 10 mg daily. , 07/05/2017SA: She reports doing well. She drives locally only. Her husband takes her to her son's office 3 times a week and helps in the office, answering phone calls. 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 She would like to participate in home exercises, which would be sponsored by her insurance. She has not fallen, thankfully.  UPDATE 09/09/2016 [name], 82 year old female returns for follow-up with her husband. She has history of memory loss and is currently on Aricept 10 mg daily and was placed on Namenda 5 mg twice a day at her last visit in July. She denies any side effects to either. Her memory score is stable. She has had no falls. 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 She continues to drive in familiar areas, she has not gotten lost. No safety issues identified She returns for reevaluation  REVIEW OF SYSTEMS: Full 14 system review of systems performed and notable only for those listed, all others are neg:  Constitutional: neg    Cardiovascular: neg Ear/Nose/Throat: neg  Skin: neg Eyes: neg Respiratory: neg Gastroitestinal: neg  Hematology/Lymphatic: neg  Endocrine: neg Musculoskeletal:neg Allergy/Immunology: neg Neurological: neg Psychiatric: neg Sleep : neg   ALLERGIES: Allergies  Allergen Reactions  . Oxycontin [Oxycodone Hcl]     HOME MEDICATIONS: Outpatient Medications Prior to Visit  Medication Sig Dispense Refill  . donepezil (ARICEPT) 10 MG tablet TAKE 1 TABLET (10 MG TOTAL) BY MOUTH AT BEDTIME. 30 tablet 5  . memantine (NAMENDA) 10 MG tablet Take 0.5 tablets (5 mg total) by mouth 2 (two) times daily. 30 tablet 5  . metoprolol (TOPROL-XL) 50 MG 24 hr tablet Take 50 mg by mouth daily after breakfast.     . perphenazine-amitriptyline (ETRAFON/TRIAVIL) 2-10 MG TABS Take 2 tablets by mouth at bedtime.     . simvastatin (ZOCOR) 20 MG tablet Take 20 mg by mouth at bedtime.     . hyoscyamine (LEVSIN) 0.125 MG/5ML ELIX Take 0.125 mg by mouth every 6 (six) hours as needed.     No facility-administered medications prior to visit.     PAST MEDICAL HISTORY: Past Medical History:  Diagnosis Date  . Arthritis    hands   . AVM (arteriovenous malformation)   . Cameron lesion, acute   . Cough   . Diverticulosis   . Diverticulosis   . GERD (gastroesophageal reflux disease)   . H/O: GI bleed   . H/O: hematuria   . Hiatal hernia   . History of colon polyps   . History of hypokalemia   . Hyperlipidemia   .  Hypertension   . Internal hemorrhoid   . Iron deficiency anemia   . Osteoporosis   . Paraesophageal hernia   . Paraesophageal hernia    history of  . Praesophageal hiatal hernia - giant, irreducible 10/20/2011    PAST SURGICAL HISTORY: Past Surgical History:  Procedure Laterality Date  . CESAREAN SECTION  01/1965  . colonscopy    . DILATION AND CURETTAGE OF UTERUS    . HERNIA REPAIR  12/2011  . LAPAROSCOPIC NISSEN FUNDOPLICATION  [id number]   Procedure: LAPAROSCOPIC NISSEN  FUNDOPLICATION;  Surgeon: Adin Hector, MD;  Location: [facility];  Service: General;  Laterality: N/A;  laparoscopic paraesphogeal hernia repair and fundoplication  . POLYPECTOMY      FAMILY HISTORY: Family History  Problem Relation Age of Onset  . Prostate cancer Brother   . Cancer Brother     prostate, and skin  . Colon cancer Neg Hx     SOCIAL HISTORY: Social History   Social History  . Marital status: Married    Spouse name: N/A  . Number of children: 1  . Years of education: N/A   Occupational History  . Business owner [employer]   Social History Main Topics  . Smoking status: Never Smoker  . Smokeless tobacco: Never Used  . Alcohol use 10.8 oz/week    18 Glasses of wine per week     Comment: 1 glasses daily  . Drug use: No  . Sexual activity: No   Other Topics Concern  . Not on file   Social History Narrative   2-3 cups of coffee a day      PHYSICAL EXAM  Vitals:   09/09/16 0800  BP: 138/72  Pulse: 63  Weight: 155 lb 9.6 oz (70.6 kg)  Height: 5\' 1"  (1.549 m)   Body mass index is 29.4 kg/m.  Generalized: Well developed, in no acute distress ,Well-groomed Head: normocephalic and atraumatic,. Oropharynx benign  Neck: Supple, no carotid bruits  Cardiac: Regular rate rhythm, no murmur  Musculoskeletal: No deformity  Skin no peripheral edema Neurological examination   Mentation: Alert oriented to time, place, history taking. She is oriented to person place not date or year. MMSE 20/30 Last 21/30. Clock drawing 3/4. AFT 2in 30 sec.   Follows all commands speech and language fluent.  On 07/15/14: Her MMSE (Mini-Mental state exam) score is 26/30. CDT (Clock Drawing Test) score is 3/4. AFT (Animal Fluency Test) score is 6.  On 05/15/2015: MMSE: 21/30, CDT: 2/4, AFT: 6/min. On 11/18/2015: MMSE: 23/30, CDT: 2/4, AFT: 5/min. 06/30/2016: MMSE: 21/30, CDT: 3/4, AFT: 6/min. Cranial nerve II-XII: Pupils were equal round reactive to light  extraocular movements were full, visual field were full on confrontational test. Facial sensation and strength were normal. hearing was intact to finger rubbing bilaterally. Uvula tongue midline. head turning and shoulder shrug were normal and symmetric.Tongue protrusion into cheek strength was normal. Motor: normal bulk and tone, full strength in the BUE, BLE, fine finger movements normal, no pronator drift. No focal weakness Sensory: normal and symmetric to light touch,   Coordination: finger-nose-finger, heel-to-shin bilaterally, no dysmetria Reflexes: Brachioradialis 2/2, biceps 2/2, triceps 2/2, patellar 2/2, Achilles 2/2, plantar responses were flexor bilaterally. Gait and Station: Rising up from seated position without assistance, narrow stance,  moderate stride, good arm swing, smooth turning, able to perform tiptoe, and heel walking without difficulty. Tandem gait is steady. Left foot position is abnormal  DIAGNOSTIC DATA (LABS, IMAGING, TESTING) -  ASSESSMENT AND PLAN  82 y.o. year old female  has a past medical history of memory loss for 2 years. Her history and findings are consistent with Alzheimer's dementia without behavioral disturbances. Her neuropsychological evaluation in the fall of 2016 was in keeping with mild cognitive impairment. We may consider repeat testing in 6 months   PLAN: Continue Aricept at current dose Increase Namenda to 10 mg twice daily Follow-up in 6 months Consider repeating neuropsych evaluation at that time Dennie Bible, [facility], [facility], [facility] [address], [place], [place] [address] [phone]  I reviewed the above note and documentation by the Nurse Practitioner and agree with the history, physical exam, assessment and plan as outlined above. I was immediately available for face-to-face consultation. Star Age, MD, PhD [facility])

## 2016-09-09 NOTE — Patient Instructions (Addendum)
Continue Aricept at current dose Increase Namenda to 10 mg twice daily will refill Follow-up in 6 months May repeat neuropsychological eval at that time

## 2016-10-01 DIAGNOSIS — Z23 Encounter for immunization: Secondary | ICD-10-CM | POA: Diagnosis not present

## 2016-10-18 DIAGNOSIS — M24875 Other specific joint derangements left foot, not elsewhere classified: Secondary | ICD-10-CM | POA: Diagnosis not present

## 2016-10-18 DIAGNOSIS — M65872 Other synovitis and tenosynovitis, left ankle and foot: Secondary | ICD-10-CM | POA: Diagnosis not present

## 2016-10-18 DIAGNOSIS — M79671 Pain in right foot: Secondary | ICD-10-CM | POA: Diagnosis not present

## 2016-10-18 DIAGNOSIS — M79672 Pain in left foot: Secondary | ICD-10-CM | POA: Diagnosis not present

## 2016-11-16 DIAGNOSIS — G3184 Mild cognitive impairment, so stated: Secondary | ICD-10-CM | POA: Diagnosis not present

## 2016-11-16 DIAGNOSIS — R270 Ataxia, unspecified: Secondary | ICD-10-CM | POA: Diagnosis not present

## 2016-11-16 DIAGNOSIS — K219 Gastro-esophageal reflux disease without esophagitis: Secondary | ICD-10-CM | POA: Diagnosis not present

## 2016-11-16 DIAGNOSIS — E782 Mixed hyperlipidemia: Secondary | ICD-10-CM | POA: Diagnosis not present

## 2016-11-16 DIAGNOSIS — I10 Essential (primary) hypertension: Secondary | ICD-10-CM | POA: Diagnosis not present

## 2016-11-16 DIAGNOSIS — D509 Iron deficiency anemia, unspecified: Secondary | ICD-10-CM | POA: Diagnosis not present

## 2016-11-16 DIAGNOSIS — Z0000 Encounter for general adult medical examination without abnormal findings: Secondary | ICD-10-CM | POA: Diagnosis not present

## 2016-11-16 DIAGNOSIS — M810 Age-related osteoporosis without current pathological fracture: Secondary | ICD-10-CM | POA: Diagnosis not present

## 2016-11-16 DIAGNOSIS — J301 Allergic rhinitis due to pollen: Secondary | ICD-10-CM | POA: Diagnosis not present

## 2016-12-02 DIAGNOSIS — M810 Age-related osteoporosis without current pathological fracture: Secondary | ICD-10-CM | POA: Diagnosis not present

## 2016-12-31 DIAGNOSIS — M818 Other osteoporosis without current pathological fracture: Secondary | ICD-10-CM | POA: Diagnosis not present

## 2016-12-31 DIAGNOSIS — M810 Age-related osteoporosis without current pathological fracture: Secondary | ICD-10-CM | POA: Diagnosis not present

## 2016-12-31 DIAGNOSIS — G3184 Mild cognitive impairment, so stated: Secondary | ICD-10-CM | POA: Diagnosis not present

## 2016-12-31 DIAGNOSIS — D509 Iron deficiency anemia, unspecified: Secondary | ICD-10-CM | POA: Diagnosis not present

## 2016-12-31 DIAGNOSIS — I10 Essential (primary) hypertension: Secondary | ICD-10-CM | POA: Diagnosis not present

## 2016-12-31 DIAGNOSIS — R270 Ataxia, unspecified: Secondary | ICD-10-CM | POA: Diagnosis not present

## 2017-01-04 ENCOUNTER — Ambulatory Visit: Payer: Medicare Other | Admitting: Neurology

## 2017-01-05 DIAGNOSIS — G3184 Mild cognitive impairment, so stated: Secondary | ICD-10-CM | POA: Diagnosis not present

## 2017-01-05 DIAGNOSIS — I10 Essential (primary) hypertension: Secondary | ICD-10-CM | POA: Diagnosis not present

## 2017-01-05 DIAGNOSIS — M810 Age-related osteoporosis without current pathological fracture: Secondary | ICD-10-CM | POA: Diagnosis not present

## 2017-01-05 DIAGNOSIS — D509 Iron deficiency anemia, unspecified: Secondary | ICD-10-CM | POA: Diagnosis not present

## 2017-01-05 DIAGNOSIS — R270 Ataxia, unspecified: Secondary | ICD-10-CM | POA: Diagnosis not present

## 2017-01-06 DIAGNOSIS — I10 Essential (primary) hypertension: Secondary | ICD-10-CM | POA: Diagnosis not present

## 2017-01-06 DIAGNOSIS — D509 Iron deficiency anemia, unspecified: Secondary | ICD-10-CM | POA: Diagnosis not present

## 2017-01-06 DIAGNOSIS — G3184 Mild cognitive impairment, so stated: Secondary | ICD-10-CM | POA: Diagnosis not present

## 2017-01-06 DIAGNOSIS — M810 Age-related osteoporosis without current pathological fracture: Secondary | ICD-10-CM | POA: Diagnosis not present

## 2017-01-06 DIAGNOSIS — R270 Ataxia, unspecified: Secondary | ICD-10-CM | POA: Diagnosis not present

## 2017-01-11 DIAGNOSIS — D509 Iron deficiency anemia, unspecified: Secondary | ICD-10-CM | POA: Diagnosis not present

## 2017-01-11 DIAGNOSIS — I10 Essential (primary) hypertension: Secondary | ICD-10-CM | POA: Diagnosis not present

## 2017-01-11 DIAGNOSIS — M810 Age-related osteoporosis without current pathological fracture: Secondary | ICD-10-CM | POA: Diagnosis not present

## 2017-01-11 DIAGNOSIS — R270 Ataxia, unspecified: Secondary | ICD-10-CM | POA: Diagnosis not present

## 2017-01-11 DIAGNOSIS — G3184 Mild cognitive impairment, so stated: Secondary | ICD-10-CM | POA: Diagnosis not present

## 2017-01-18 DIAGNOSIS — G3184 Mild cognitive impairment, so stated: Secondary | ICD-10-CM | POA: Diagnosis not present

## 2017-01-18 DIAGNOSIS — D509 Iron deficiency anemia, unspecified: Secondary | ICD-10-CM | POA: Diagnosis not present

## 2017-01-18 DIAGNOSIS — I10 Essential (primary) hypertension: Secondary | ICD-10-CM | POA: Diagnosis not present

## 2017-01-18 DIAGNOSIS — M810 Age-related osteoporosis without current pathological fracture: Secondary | ICD-10-CM | POA: Diagnosis not present

## 2017-01-18 DIAGNOSIS — R270 Ataxia, unspecified: Secondary | ICD-10-CM | POA: Diagnosis not present

## 2017-01-20 DIAGNOSIS — I10 Essential (primary) hypertension: Secondary | ICD-10-CM | POA: Diagnosis not present

## 2017-01-20 DIAGNOSIS — M810 Age-related osteoporosis without current pathological fracture: Secondary | ICD-10-CM | POA: Diagnosis not present

## 2017-01-20 DIAGNOSIS — D509 Iron deficiency anemia, unspecified: Secondary | ICD-10-CM | POA: Diagnosis not present

## 2017-01-20 DIAGNOSIS — R270 Ataxia, unspecified: Secondary | ICD-10-CM | POA: Diagnosis not present

## 2017-01-20 DIAGNOSIS — G3184 Mild cognitive impairment, so stated: Secondary | ICD-10-CM | POA: Diagnosis not present

## 2017-01-25 DIAGNOSIS — R270 Ataxia, unspecified: Secondary | ICD-10-CM | POA: Diagnosis not present

## 2017-01-25 DIAGNOSIS — I10 Essential (primary) hypertension: Secondary | ICD-10-CM | POA: Diagnosis not present

## 2017-01-25 DIAGNOSIS — G3184 Mild cognitive impairment, so stated: Secondary | ICD-10-CM | POA: Diagnosis not present

## 2017-01-25 DIAGNOSIS — D509 Iron deficiency anemia, unspecified: Secondary | ICD-10-CM | POA: Diagnosis not present

## 2017-01-25 DIAGNOSIS — M810 Age-related osteoporosis without current pathological fracture: Secondary | ICD-10-CM | POA: Diagnosis not present

## 2017-01-27 DIAGNOSIS — D509 Iron deficiency anemia, unspecified: Secondary | ICD-10-CM | POA: Diagnosis not present

## 2017-01-27 DIAGNOSIS — M810 Age-related osteoporosis without current pathological fracture: Secondary | ICD-10-CM | POA: Diagnosis not present

## 2017-01-27 DIAGNOSIS — I10 Essential (primary) hypertension: Secondary | ICD-10-CM | POA: Diagnosis not present

## 2017-01-27 DIAGNOSIS — R270 Ataxia, unspecified: Secondary | ICD-10-CM | POA: Diagnosis not present

## 2017-01-27 DIAGNOSIS — G3184 Mild cognitive impairment, so stated: Secondary | ICD-10-CM | POA: Diagnosis not present

## 2017-02-07 DIAGNOSIS — M810 Age-related osteoporosis without current pathological fracture: Secondary | ICD-10-CM | POA: Diagnosis not present

## 2017-03-10 ENCOUNTER — Encounter: Payer: Self-pay | Admitting: Nurse Practitioner

## 2017-03-10 ENCOUNTER — Encounter (INDEPENDENT_AMBULATORY_CARE_PROVIDER_SITE_OTHER): Payer: Self-pay

## 2017-03-10 ENCOUNTER — Ambulatory Visit (INDEPENDENT_AMBULATORY_CARE_PROVIDER_SITE_OTHER): Payer: Medicare Other | Admitting: Nurse Practitioner

## 2017-03-10 VITALS — BP 126/80 | HR 56 | Ht 61.0 in | Wt 150.0 lb

## 2017-03-10 DIAGNOSIS — F0280 Dementia in other diseases classified elsewhere without behavioral disturbance: Secondary | ICD-10-CM | POA: Diagnosis not present

## 2017-03-10 DIAGNOSIS — G309 Alzheimer's disease, unspecified: Secondary | ICD-10-CM | POA: Diagnosis not present

## 2017-03-10 NOTE — Progress Notes (Addendum)
[facility]  PATIENT: Brooke [name] DOB: 12-Oct-1932   REASON FOR VISIT: Follow-up for memory loss HISTORY FROM: patient and husband    HISTORY OF PRESENT ILLNESS: HISTORY Brooke. [name] is an 83 year old right-handed woman with an underlying medical history of hypertension, reflux disease, depression, allergies, osteoporosis, status post hiatal hernia repair, who presents for follow-up consultation of her memory loss. The patient is accompanied by her husband again today. I last saw her on 11/18/2015, at which time she reported doing quite well. She was able to tolerate Aricept 10 mg daily. She was driving, only during daylight times and local roads only. She was no longer driving on the eye ways to enter states. She was not always drink enough water. She is not exercising very much at all. Her MMSE was 23/30, CDT: 2/4, AFT: 5/min. I suggested we continue with generic Aricept 10 mg daily. , 07/05/2017SA: She reports doing well. She drives locally only. Her husband takes her to her son's office 3 times a week and helps in the office, answering phone calls. 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 She would like to participate in home exercises, which would be sponsored by her insurance. She has not fallen, thankfully.  UPDATE 09/09/2016 CM Brooke [name], 83 year old female returns for follow-up with her husband. She has history of memory loss and is currently on Aricept 10 mg daily and was placed on Namenda 5 mg twice a day at her last visit in July. She denies any side effects to either. Her memory score is stable. She has had no falls. 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 She continues to drive in familiar areas, she has not gotten lost. No safety issues identified She returns for reevaluation UPDATE 03/15/2018CM Brooke [name], 83 year old female returns for follow-up with her husband she has a history of memory loss and is currently  on Aricept 10 mg daily and Namenda 10 mg twice daily she thinks her memory is stable. She denies any recent falls she continues to go to the office every day. She no longer drives. No safety issues have been identified. It was suggested that she have repeat neuro psych testing however patient wants to wait another 6 months she returns for reevaluation REVIEW OF SYSTEMS: Full 14 system review of systems performed and notable only for those listed, all others are neg:  Constitutional: neg  Cardiovascular: neg Ear/Nose/Throat: neg  Skin: neg Eyes: neg Respiratory: neg Gastroitestinal: neg  Hematology/Lymphatic: neg  Endocrine: neg Musculoskeletal:neg Allergy/Immunology: neg Neurological: Memory loss, weakness Psychiatric: neg Sleep : neg   ALLERGIES: Allergies  Allergen Reactions  . Oxycontin [Oxycodone Hcl]     HOME MEDICATIONS: Outpatient Medications Prior to Visit  Medication Sig Dispense Refill  . donepezil (ARICEPT) 10 MG tablet TAKE 1 TABLET (10 MG TOTAL) BY MOUTH AT BEDTIME. 30 tablet 6  . memantine (NAMENDA) 10 MG tablet Take 1 tablet (10 mg total) by mouth 2 (two) times daily. 60 tablet 6  . metoprolol (TOPROL-XL) 50 MG 24 hr tablet Take 50 mg by mouth daily after breakfast.     . perphenazine-amitriptyline (ETRAFON/TRIAVIL) 2-10 MG TABS Take 2 tablets by mouth at bedtime.     . simvastatin (ZOCOR) 20 MG tablet Take 20 mg by mouth at bedtime.      No facility-administered medications prior to visit.     PAST MEDICAL HISTORY: Past Medical History:  Diagnosis Date  . Arthritis  hands   . AVM (arteriovenous malformation)   . Cameron lesion, acute   . Cough   . Diverticulosis   . Diverticulosis   . GERD (gastroesophageal reflux disease)   . H/O: GI bleed   . H/O: hematuria   . Hiatal hernia   . History of colon polyps   . History of hypokalemia   . Hyperlipidemia   . Hypertension   . Internal hemorrhoid   . Iron deficiency anemia   . Osteoporosis   .  Paraesophageal hernia   . Paraesophageal hernia    history of  . Praesophageal hiatal hernia - giant, irreducible 10/20/2011    PAST SURGICAL HISTORY: Past Surgical History:  Procedure Laterality Date  . CESAREAN SECTION  01/1965  . colonscopy    . DILATION AND CURETTAGE OF UTERUS    . HERNIA REPAIR  12/2011  . LAPAROSCOPIC NISSEN FUNDOPLICATION  3/81/8299   Procedure: LAPAROSCOPIC NISSEN FUNDOPLICATION;  Surgeon: Adin Hector, MD;  Location: [facility];  Service: General;  Laterality: N/A;  laparoscopic paraesphogeal hernia repair and fundoplication  . POLYPECTOMY      FAMILY HISTORY: Family History  Problem Relation Age of Onset  . Prostate cancer Brother   . Cancer Brother     prostate, and skin  . Colon cancer Neg Hx     SOCIAL HISTORY: Social History   Social History  . Marital status: Married    Spouse name: N/A  . Number of children: 1  . Years of education: N/A   Occupational History  . Business owner [employer]   Social History Main Topics  . Smoking status: Never Smoker  . Smokeless tobacco: Never Used  . Alcohol use 10.8 oz/week    18 Glasses of wine per week     Comment: 1 glasses daily  . Drug use: No  . Sexual activity: No   Other Topics Concern  . Not on file   Social History Narrative   2-3 cups of coffee a day      PHYSICAL EXAM  Vitals:   03/10/17 0930  BP: 126/80  Pulse: (!) 56  Weight: 150 lb (68 kg)  Height: 5\' 1"  (1.549 m)   Body mass index is 28.34 kg/m.  Generalized: Well developed, in no acute distress ,Well-groomed Head: normocephalic and atraumatic,. Oropharynx benign  Neck: Supple, no carotid bruits  Cardiac: Regular rate rhythm, no murmur  Musculoskeletal: No deformity  Skin no peripheral edema Neurological examination   Mentation:  MMSE - Mini Mental State Exam 03/10/2017 09/09/2016 06/30/2016  Orientation to time 3 2 4   Orientation to Place 4 3 4   Registration 3 3 3   Attention/ Calculation  0 0 0  Recall 1 1 1   Language- name 2 objects 2 2 2   Language- repeat 1 1 1   Language- follow 3 step command 3 3 3   Language- read & follow direction 1 1 1   Write a sentence 0 1 1  Copy design 0 1 1  Total score 18 18 21   AFT 4. Clock drawing 2/4.   Cranial nerve II-XII: Pupils were equal round reactive to light extraocular movements were full, visual field were full on confrontational test. Facial sensation and strength were normal. hearing was intact to finger rubbing bilaterally. Uvula tongue midline. head turning and shoulder shrug were normal and symmetric.Tongue protrusion into cheek strength was normal. Motor: normal bulk and tone, full strength in the BUE, BLE, fine finger movements normal, no pronator  drift. No focal weakness Sensory: normal and symmetric to light touch on the face arms and legs,   Coordination: finger-nose-finger, heel-to-shin bilaterally, no dysmetria, tremor Reflexes: Brachioradialis 2/2, biceps 2/2, triceps 2/2, patellar 2/2, Achilles 2/2, plantar responses were flexor bilaterally. Gait and Station: Rising up from seated position without assistance, narrow stance,  moderate stride, good arm swing, smooth turning, able to perform tiptoe, and heel walking without difficulty. Tandem gait is steady. Left foot position is abnormal. No assistive device  DIAGNOSTIC DATA (LABS, IMAGING, TESTING) - ASSESSMENT AND PLAN  83 y.o. year old female  has a past medical history of memory loss for 2.5  years. Her history and findings are consistent with Alzheimer's dementia without behavioral disturbances. Her neuropsychological evaluation in the fall of 2016 was in keeping with mild cognitive impairment. We discussed repeating at this visit however patient and husband preferred to wait another six months   PLAN: Continue Aricept at current dose Increase Namenda to 10 mg twice daily Follow-up in 6 months next with Dr.Athar Consider repeating neuropsych evaluation at that  time Keep  a safe environment,  Reduce confusion, keep familiar objects and people around, stick to a routine Use effective communication such as simple words and short sentences Reduce nighttime restlessness, a consistent nighttime routine,  avoid napping during the day Encourage good nutrition and hydration Seek medical care for acute worsening confusion or fever, this usually indicates infection Greater than 50% of time during this 25 minute visit was spent on counseling,explanation of diagnosis, planning of further management, discussion with patient and husband regarding repeating neuropsychological evaluation which helps [place] manage her memory loss . Dennie Bible, [facility], [facility], APRN  [facility] [address], [place], [place] [address] [phone]  I reviewed the above note and documentation by the Nurse Practitioner and agree with the history, physical exam, assessment and plan as outlined above. I was immediately available for face-to-face consultation. Star Age, MD, PhD [facility])

## 2017-03-10 NOTE — Patient Instructions (Signed)
Continue Aricept at current dose Increase Namenda to 10 mg twice daily Follow-up in 6 months next with Dr.Athar Consider repeating neuropsych evaluation at that time

## 2017-05-03 ENCOUNTER — Other Ambulatory Visit: Payer: Self-pay | Admitting: Nurse Practitioner

## 2017-05-03 DIAGNOSIS — F0280 Dementia in other diseases classified elsewhere without behavioral disturbance: Secondary | ICD-10-CM

## 2017-05-03 DIAGNOSIS — G301 Alzheimer's disease with late onset: Principal | ICD-10-CM

## 2017-05-16 DIAGNOSIS — E782 Mixed hyperlipidemia: Secondary | ICD-10-CM | POA: Diagnosis not present

## 2017-05-16 DIAGNOSIS — G3184 Mild cognitive impairment, so stated: Secondary | ICD-10-CM | POA: Diagnosis not present

## 2017-05-16 DIAGNOSIS — R270 Ataxia, unspecified: Secondary | ICD-10-CM | POA: Diagnosis not present

## 2017-05-16 DIAGNOSIS — I10 Essential (primary) hypertension: Secondary | ICD-10-CM | POA: Diagnosis not present

## 2017-05-16 DIAGNOSIS — K589 Irritable bowel syndrome without diarrhea: Secondary | ICD-10-CM | POA: Diagnosis not present

## 2017-05-20 DIAGNOSIS — G3184 Mild cognitive impairment, so stated: Secondary | ICD-10-CM | POA: Diagnosis not present

## 2017-05-20 DIAGNOSIS — R270 Ataxia, unspecified: Secondary | ICD-10-CM | POA: Diagnosis not present

## 2017-05-20 DIAGNOSIS — Z9181 History of falling: Secondary | ICD-10-CM | POA: Diagnosis not present

## 2017-05-20 DIAGNOSIS — M810 Age-related osteoporosis without current pathological fracture: Secondary | ICD-10-CM | POA: Diagnosis not present

## 2017-05-20 DIAGNOSIS — I10 Essential (primary) hypertension: Secondary | ICD-10-CM | POA: Diagnosis not present

## 2017-05-24 DIAGNOSIS — Z9181 History of falling: Secondary | ICD-10-CM | POA: Diagnosis not present

## 2017-05-24 DIAGNOSIS — I10 Essential (primary) hypertension: Secondary | ICD-10-CM | POA: Diagnosis not present

## 2017-05-24 DIAGNOSIS — R270 Ataxia, unspecified: Secondary | ICD-10-CM | POA: Diagnosis not present

## 2017-05-24 DIAGNOSIS — M810 Age-related osteoporosis without current pathological fracture: Secondary | ICD-10-CM | POA: Diagnosis not present

## 2017-05-24 DIAGNOSIS — G3184 Mild cognitive impairment, so stated: Secondary | ICD-10-CM | POA: Diagnosis not present

## 2017-05-26 DIAGNOSIS — G3184 Mild cognitive impairment, so stated: Secondary | ICD-10-CM | POA: Diagnosis not present

## 2017-05-26 DIAGNOSIS — I10 Essential (primary) hypertension: Secondary | ICD-10-CM | POA: Diagnosis not present

## 2017-05-26 DIAGNOSIS — Z9181 History of falling: Secondary | ICD-10-CM | POA: Diagnosis not present

## 2017-05-26 DIAGNOSIS — R270 Ataxia, unspecified: Secondary | ICD-10-CM | POA: Diagnosis not present

## 2017-05-26 DIAGNOSIS — M810 Age-related osteoporosis without current pathological fracture: Secondary | ICD-10-CM | POA: Diagnosis not present

## 2017-05-31 DIAGNOSIS — Z9181 History of falling: Secondary | ICD-10-CM | POA: Diagnosis not present

## 2017-05-31 DIAGNOSIS — I10 Essential (primary) hypertension: Secondary | ICD-10-CM | POA: Diagnosis not present

## 2017-05-31 DIAGNOSIS — M810 Age-related osteoporosis without current pathological fracture: Secondary | ICD-10-CM | POA: Diagnosis not present

## 2017-05-31 DIAGNOSIS — G3184 Mild cognitive impairment, so stated: Secondary | ICD-10-CM | POA: Diagnosis not present

## 2017-05-31 DIAGNOSIS — R270 Ataxia, unspecified: Secondary | ICD-10-CM | POA: Diagnosis not present

## 2017-06-01 DIAGNOSIS — Z9181 History of falling: Secondary | ICD-10-CM | POA: Diagnosis not present

## 2017-06-01 DIAGNOSIS — R270 Ataxia, unspecified: Secondary | ICD-10-CM | POA: Diagnosis not present

## 2017-06-01 DIAGNOSIS — G3184 Mild cognitive impairment, so stated: Secondary | ICD-10-CM | POA: Diagnosis not present

## 2017-06-01 DIAGNOSIS — I10 Essential (primary) hypertension: Secondary | ICD-10-CM | POA: Diagnosis not present

## 2017-06-01 DIAGNOSIS — M810 Age-related osteoporosis without current pathological fracture: Secondary | ICD-10-CM | POA: Diagnosis not present

## 2017-06-07 DIAGNOSIS — G3184 Mild cognitive impairment, so stated: Secondary | ICD-10-CM | POA: Diagnosis not present

## 2017-06-07 DIAGNOSIS — M810 Age-related osteoporosis without current pathological fracture: Secondary | ICD-10-CM | POA: Diagnosis not present

## 2017-06-07 DIAGNOSIS — R270 Ataxia, unspecified: Secondary | ICD-10-CM | POA: Diagnosis not present

## 2017-06-07 DIAGNOSIS — Z9181 History of falling: Secondary | ICD-10-CM | POA: Diagnosis not present

## 2017-06-07 DIAGNOSIS — I10 Essential (primary) hypertension: Secondary | ICD-10-CM | POA: Diagnosis not present

## 2017-06-09 DIAGNOSIS — G3184 Mild cognitive impairment, so stated: Secondary | ICD-10-CM | POA: Diagnosis not present

## 2017-06-09 DIAGNOSIS — I10 Essential (primary) hypertension: Secondary | ICD-10-CM | POA: Diagnosis not present

## 2017-06-09 DIAGNOSIS — R270 Ataxia, unspecified: Secondary | ICD-10-CM | POA: Diagnosis not present

## 2017-06-09 DIAGNOSIS — Z9181 History of falling: Secondary | ICD-10-CM | POA: Diagnosis not present

## 2017-06-09 DIAGNOSIS — M810 Age-related osteoporosis without current pathological fracture: Secondary | ICD-10-CM | POA: Diagnosis not present

## 2017-06-13 ENCOUNTER — Other Ambulatory Visit: Payer: Self-pay | Admitting: Neurology

## 2017-06-13 DIAGNOSIS — R270 Ataxia, unspecified: Secondary | ICD-10-CM | POA: Diagnosis not present

## 2017-06-13 DIAGNOSIS — M810 Age-related osteoporosis without current pathological fracture: Secondary | ICD-10-CM | POA: Diagnosis not present

## 2017-06-13 DIAGNOSIS — G301 Alzheimer's disease with late onset: Principal | ICD-10-CM

## 2017-06-13 DIAGNOSIS — G3184 Mild cognitive impairment, so stated: Secondary | ICD-10-CM | POA: Diagnosis not present

## 2017-06-13 DIAGNOSIS — I10 Essential (primary) hypertension: Secondary | ICD-10-CM | POA: Diagnosis not present

## 2017-06-13 DIAGNOSIS — Z9181 History of falling: Secondary | ICD-10-CM | POA: Diagnosis not present

## 2017-06-13 DIAGNOSIS — F0280 Dementia in other diseases classified elsewhere without behavioral disturbance: Secondary | ICD-10-CM

## 2017-06-13 MED ORDER — MEMANTINE HCL 10 MG PO TABS: 10.0000 mg | ORAL_TABLET | Freq: Two times a day (BID) | ORAL | 1 refills | Status: DC

## 2017-06-15 DIAGNOSIS — Z9181 History of falling: Secondary | ICD-10-CM | POA: Diagnosis not present

## 2017-06-15 DIAGNOSIS — G3184 Mild cognitive impairment, so stated: Secondary | ICD-10-CM | POA: Diagnosis not present

## 2017-06-15 DIAGNOSIS — I10 Essential (primary) hypertension: Secondary | ICD-10-CM | POA: Diagnosis not present

## 2017-06-15 DIAGNOSIS — R270 Ataxia, unspecified: Secondary | ICD-10-CM | POA: Diagnosis not present

## 2017-06-15 DIAGNOSIS — M810 Age-related osteoporosis without current pathological fracture: Secondary | ICD-10-CM | POA: Diagnosis not present

## 2017-06-20 DIAGNOSIS — R270 Ataxia, unspecified: Secondary | ICD-10-CM | POA: Diagnosis not present

## 2017-06-20 DIAGNOSIS — I10 Essential (primary) hypertension: Secondary | ICD-10-CM | POA: Diagnosis not present

## 2017-06-20 DIAGNOSIS — Z9181 History of falling: Secondary | ICD-10-CM | POA: Diagnosis not present

## 2017-06-20 DIAGNOSIS — M810 Age-related osteoporosis without current pathological fracture: Secondary | ICD-10-CM | POA: Diagnosis not present

## 2017-06-20 DIAGNOSIS — G3184 Mild cognitive impairment, so stated: Secondary | ICD-10-CM | POA: Diagnosis not present

## 2017-06-27 DIAGNOSIS — M810 Age-related osteoporosis without current pathological fracture: Secondary | ICD-10-CM | POA: Diagnosis not present

## 2017-06-27 DIAGNOSIS — R270 Ataxia, unspecified: Secondary | ICD-10-CM | POA: Diagnosis not present

## 2017-06-27 DIAGNOSIS — Z9181 History of falling: Secondary | ICD-10-CM | POA: Diagnosis not present

## 2017-06-27 DIAGNOSIS — I10 Essential (primary) hypertension: Secondary | ICD-10-CM | POA: Diagnosis not present

## 2017-06-27 DIAGNOSIS — G3184 Mild cognitive impairment, so stated: Secondary | ICD-10-CM | POA: Diagnosis not present

## 2017-07-01 DIAGNOSIS — M810 Age-related osteoporosis without current pathological fracture: Secondary | ICD-10-CM | POA: Diagnosis not present

## 2017-07-01 DIAGNOSIS — I10 Essential (primary) hypertension: Secondary | ICD-10-CM | POA: Diagnosis not present

## 2017-07-01 DIAGNOSIS — G3184 Mild cognitive impairment, so stated: Secondary | ICD-10-CM | POA: Diagnosis not present

## 2017-07-01 DIAGNOSIS — R270 Ataxia, unspecified: Secondary | ICD-10-CM | POA: Diagnosis not present

## 2017-07-01 DIAGNOSIS — Z9181 History of falling: Secondary | ICD-10-CM | POA: Diagnosis not present

## 2017-07-06 DIAGNOSIS — H2513 Age-related nuclear cataract, bilateral: Secondary | ICD-10-CM | POA: Diagnosis not present

## 2017-08-22 ENCOUNTER — Other Ambulatory Visit: Payer: Self-pay | Admitting: Nurse Practitioner

## 2017-09-14 ENCOUNTER — Ambulatory Visit (INDEPENDENT_AMBULATORY_CARE_PROVIDER_SITE_OTHER): Payer: Medicare Other | Admitting: Neurology

## 2017-09-14 ENCOUNTER — Encounter: Payer: Self-pay | Admitting: Neurology

## 2017-09-14 VITALS — BP 154/76 | HR 55 | Ht 61.0 in | Wt 144.0 lb

## 2017-09-14 DIAGNOSIS — G301 Alzheimer's disease with late onset: Secondary | ICD-10-CM | POA: Diagnosis not present

## 2017-09-14 DIAGNOSIS — F0280 Dementia in other diseases classified elsewhere without behavioral disturbance: Secondary | ICD-10-CM | POA: Diagnosis not present

## 2017-09-14 MED ORDER — DONEPEZIL HCL 10 MG PO TABS: 10.0000 mg | ORAL_TABLET | Freq: Every day | ORAL | 3 refills | Status: DC

## 2017-09-14 MED ORDER — MEMANTINE HCL 10 MG PO TABS: 10.0000 mg | ORAL_TABLET | Freq: Two times a day (BID) | ORAL | 3 refills | Status: DC

## 2017-09-14 NOTE — Patient Instructions (Addendum)
As far as the Cerefolin, let me do some more investigation and get back to you about it.   Let's continue your 2 memory medications at the current doses.   Please follow up with Jinny Blossom, NP, in 6 months.

## 2017-09-14 NOTE — Progress Notes (Signed)
Subjective:    Patient ID: Brooke [name] is a 83 y.o. female.  HPI     Interim history:   Brooke [name] is an 83 year old right-handed woman with an underlying medical history of hypertension, reflux disease, depression, allergies, osteoporosis, status post hiatal hernia repair, who presents for follow-up consultation of her memory loss. The patient is accompanied by her husband again today. I last saw her on 06/30/2016, at which time she was fairly stable.  She saw Cecille Rubin in the interim in September 2017 and also March 2018. She was maintained on Aricept and Namenda was increased to 10 mg twice a day.   Today, 09/14/2017 (all dictated new, as well as above notes, some dictation done in note pad or Word, outside of chart, may appear as copied):   She reports feeling stable. He is concerned about her decline. She is less motivated to do things. She has essentially stopped reading the paper. She watches TV a lot and is more sedentary. He tries to push her for exercise and oral fluid and water intake. He is wondering if she may benefit from taking Cerefolin. She has had one fall, trying the, out of a Architect trailer. She fell onto a step backwards, thankfully did not hurt herself. No recent acute illness.  The patient's allergies, current medications, family history, past medical history, past social history, past surgical history and problem list were reviewed and updated as appropriate.   Previously (copied from previous notes for reference):   I saw her on 11/18/2015, at which time she reported doing quite well. She was able to tolerate Aricept 10 mg daily. She was driving, only during daylight times and local roads only. She was no longer driving on the eye ways to enter states. She was not always drink enough water. She is not exercising very much at all. Her MMSE was 23/30, CDT: 2/4, AFT: 5/min. I suggested we continue with generic Aricept 10 mg daily.   I saw her on  05/15/2015, at which time the patient was able to give a fairly good history and reported doing a little better as far as her memory. She was on Aricept 5 mg daily generic. She was not drinking enough water. Her husband felt that she was not as interactive and as. She is not exercising regularly. She had reduced her wine to 1 glass per day. Her MMSE was 21 out of 30, clock drawing was 2 out of 4, animal fluency was 6/m at her last visit. I suggested we increase her Aricept to 10 mg once daily and proceed with formal cognitive testing through neuropsychology.  She saw Dr. Valentina Shaggy on 08/22/2015 as well as 09/02/2015 and I reviewed the neuropsychological evaluation report:   Impression: Mild Cognitive Impairment, Multiple Domains [G31.84]   Recommendations 1.   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 It is advisable that she not be involved with financial transactions, either at home or at work.   2.   She was encouraged to regularly exercise including aerobic activities and to maintain her participation in social activities as ways to possibly forestall further cognitive decline.   3.         Her cognitive functioning should be monitored. A repeat neuropsychological evaluation, perhaps in one year to eighteen months (or sooner if clinically indicated), is recommended as a means to track her cognitive functioning.  I first met her on 07/15/2014 at the request of her primary care physician, at which time she reported an approximately one-year history of memory loss. She had previously had blood work and a brain MRI and was taking Aricept 5 mg once daily. I asked her to continue this. We talked about potentially pursuing formal cognitive testing with a neuropsychological evaluation but mutually decided to hold off. Her MMSE was 26 at the time. Of note, she no showed for an appointment on 01/14/2015.   She  has had memory loss for the past year. Per family, this has been more progressive in the last few months. Blood work from August 2014 was reviewed, this included CMP which was unremarkable and cholesterol panel which showed LDL of 108. She was started on Aricept in August of last year at 5 mg strength.    She had a brain MRI with and without contrast on 06/21/2014: 1. No evidence of acute intracranial abnormality or mass. 2. Moderate chronic small vessel ischemic disease. In addition, personally reviewed the images through the PACS system.   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 She felt overall improved and was not keen on trying anything new.    She has been taking perphenazine/amitriptyline 2 pills each night for sleep.   She has no VH/AH and no delusion and she drives without issues.   She denies depression. She has one son.   The patient denies prior TIA or stroke symptoms, such as sudden onset of one sided weakness, numbness, tingling, slurring of speech or droopy face, hearing loss, tinnitus, diplopia or visual field cut or monocular loss of vision, and denies recurrent headaches.   Of note, the patient reports snoring, and there is no report of witnessed apneas or choking sensations while asleep. she has been sleeping semi-upright since her hiatal hernia surgery 2 years ago.   She drinks wine 1 glass a day, she drinks coffee 1 cup in the morning and 1 soda per day. She is a non-smoker.   She has a B12 check and RPR in 1/13. She had a sister with AD (died at age 72 or 65) and a MA with AD (died in her early 58s).   Her Past Medical History Is Significant For: Past Medical History:  Diagnosis Date  . Arthritis    hands   . AVM (arteriovenous malformation)   . Cameron lesion, acute   . Cough   . Diverticulosis   . Diverticulosis   . GERD  (gastroesophageal reflux disease)   . H/O: GI bleed   . H/O: hematuria   . Hiatal hernia   . History of colon polyps   . History of hypokalemia   . Hyperlipidemia   . Hypertension   . Internal hemorrhoid   . Iron deficiency anemia   . Osteoporosis   . Paraesophageal hernia   . Paraesophageal hernia    history of  . Praesophageal hiatal hernia - giant, irreducible 10/20/2011    Her Past Surgical History Is Significant For: Past Surgical History:  Procedure Laterality Date  . CESAREAN SECTION  01/1965  . colonscopy    . DILATION AND CURETTAGE OF UTERUS    . HERNIA REPAIR  12/2011  . LAPAROSCOPIC NISSEN FUNDOPLICATION  6/76/7209   Procedure: LAPAROSCOPIC NISSEN FUNDOPLICATION;  Surgeon: Adin Hector, MD;  Location: [facility];  Service: General;  Laterality: N/A;  laparoscopic paraesphogeal hernia repair and fundoplication  . POLYPECTOMY      Her Family History Is Significant For: Family History  Problem Relation Age of Onset  . Prostate cancer Brother   . Cancer Brother        prostate, and skin  . Colon cancer Neg Hx     Her Social History Is Significant For: Social History   Social History  . Marital status: Married    Spouse name: N/A  . Number of children: 1  . Years of education: N/A   Occupational History  . Business owner [employer]   Social History Main Topics  . Smoking status: Never Smoker  . Smokeless tobacco: Never Used  . Alcohol use 10.8 oz/week    18 Glasses of wine per week     Comment: 1 glasses daily  . Drug use: No  . Sexual activity: No   Other Topics Concern  . None   Social History Narrative   2-3 cups of coffee a day     Her Allergies Are:  Allergies  Allergen Reactions  . Oxycontin [Oxycodone Hcl]   :   Her Current Medications Are:  Outpatient Encounter Prescriptions as of 09/14/2017  Medication Sig  . donepezil (ARICEPT) 10 MG tablet Take 1 tablet (10 mg total) by mouth at bedtime.  . memantine  (NAMENDA) 10 MG tablet Take 1 tablet (10 mg total) by mouth 2 (two) times daily.  . metoprolol (TOPROL-XL) 50 MG 24 hr tablet Take 50 mg by mouth daily after breakfast.   . perphenazine-amitriptyline (ETRAFON/TRIAVIL) 2-10 MG TABS Take 2 tablets by mouth at bedtime.   . simvastatin (ZOCOR) 20 MG tablet Take 20 mg by mouth at bedtime.   . [DISCONTINUED] donepezil (ARICEPT) 10 MG tablet TAKE 1 TABLET (10 MG TOTAL) BY MOUTH AT BEDTIME.  . [DISCONTINUED] memantine (NAMENDA) 10 MG tablet Take 1 tablet (10 mg total) by mouth 2 (two) times daily.   No facility-administered encounter medications on file as of 09/14/2017.   :  Review of Systems:  Out of a complete 14 point review of systems, all are reviewed and negative with the exception of these symptoms as listed below: Review of Systems  Neurological:       Patient reports that she is doing fine. Pt's husband wants to discuss cerefolin as a potential medication.    Objective:  Neurological Exam  Physical Exam Physical Examination:   Vitals:   09/14/17 1037  BP: (!) 154/76  Pulse: (!) 55   General Examination: The patient is a very pleasant 83 y.o. female in no acute distress. She appears well-developed and well-nourished and well groomed.   HEENT: Normocephalic, atraumatic, pupils are equal, round and reactive to light and accommodation. She has mild bilateral cataracts. Corrective eye glasses. Extraocular tracking shows no significant saccadic breakdown without nystagmus noted. Hearing is intact. Face is symmetric with no facial masking and normal facial sensation. There is no lip, neck or jaw tremor. Neck is not rigid with intact passive ROM. There are no carotid bruits on auscultation. Oropharynx exam reveals mild mouth dryness. No significant airway crowding is noted. Mallampati is class III. Tongue protrudes centrally and palate elevates symmetrically.    Chest: is clear to auscultation without wheezing, rhonchi or crackles  noted.  Heart: sounds are regular and normal without murmurs, rubs or gallops noted.   Abdomen: is soft, non-tender and non-distended with normal bowel sounds  appreciated on auscultation.  Extremities: There is no pitting edema in the distal lower extremities bilaterally. Pedal pulses are intact.   Skin: is warm and dry with no trophic changes noted. Age-related changes are noted on the skin.   Musculoskeletal: exam reveals no obvious joint deformities, tenderness or joint swelling or erythema, except for L foot flat footed and pointing out.   Neurologically:  Mental status: The patient is awake and alert, paying good  attention. She is able to provide the history. Her husband provides details. She is oriented to: person, place, time/date, situation, day of week, month of year and year. Her memory, attention, language and knowledge are very mildly. There is no aphasia, agnosia, apraxia or anomia. There is a no significant degree of bradyphrenia. Speech is not hypophonic with no dysarthria noted. Mood is congruent and affect is normal.   On 07/15/14: Her MMSE (Mini-Mental state exam) score is 26/30. CDT (Clock Drawing Test) score is 3/4. AFT (Animal Fluency Test) score is 6.   On 05/15/2015: MMSE: 21/30, CDT: 2/4, AFT: 6/min.  On 11/18/2015: MMSE: 23/30, CDT: 2/4, AFT: 5/min.  06/30/2016: MMSE: 21/30, CDT: 3/4, AFT: 6/min.  On 03/10/17: MMSE: 18/30.  On 09/14/2017: MMSE: 14/30., CDT: 1/4, AFT: 3/min.   Cranial nerves are as described above under HEENT exam. In addition, shoulder shrug is normal with equal shoulder height noted.  Motor exam: Normal bulk, and strength for age is noted. Tone is not rigid with absence of cogwheeling in the extremities. There is overall no significant bradykinesia. There is no drift or rebound. There is no tremor.  Romberg is negative. Reflexes are 1+ in the upper extremities and 1+ in the lower extremities. Toes are downgoing bilaterally. Fine motor  skills: mildly slow in the UEs and LEs.   Cerebellar testing shows no dysmetria or intention tremor on finger to nose testing. Heel to shin is unremarkable. There is no truncal or gait ataxia.   Sensory exam is intact to light touch in the upper and lower extremities.   Gait, station and balance: She stands up from the seated position with no significant difficulty and needs no assistance. No veering to one side is noted. No leaning to one side. Posture is age-appropriate. Stance is narrow-based. She turns in 3 steps. Abnormal L foot position.    Assessment and Plan:   In summary, WYNNIE PACETTI is a very pleasant 83 year old female with an underlying medical history of hypertension, reflux disease, depression, allergies, osteoporosis, status post hiatal hernia repair, who presents for follow up consultation of her Alzheimer's dementia, without evidence of behavioral disturbance. Her neuropsychological evaluation in the fall of 2016 was in keeping with mild cognitive impairment. We may consider repeat testing at the next visit. She has been on Aricept generic 10 mg once daily and has been tolerating this well. Her memory scores have declined in the recent past and we started Namenda XR 7 mg once daily last year, but had to switch to generic in the interim, now at 10 mg bid. She will continue with generic Aricept once daily. I will look into potentially adding cerefolin, medical supplement. She would benefit from more physical activity. She is reminded to stay well-hydrated. She had a brain MRI with and without contrast on 06/21/2014 which showed no evidence of acute intracranial abnormality or mass and moderate chronic small vessel ischemic disease. I suggested a 6 month FU with NP. I answered all their questions today and the patient and her husband were  in agreement. I encouraged them to call with any interim questions, concerns, problems, or updates. I spent 25 minutes in total face-to-face  time with the patient, more than 50% of which was spent in counseling and coordination of care, reviewing test results, reviewing medication and discussing or reviewing the diagnosis of OSA, its prognosis and treatment options. Pertinent laboratory and imaging test results that were available during this visit with the patient were reviewed by me and considered in my medical decision making (see chart for details).

## 2017-09-20 ENCOUNTER — Encounter: Payer: Self-pay | Admitting: Neurology

## 2017-09-29 DIAGNOSIS — Z23 Encounter for immunization: Secondary | ICD-10-CM | POA: Diagnosis not present

## 2017-10-02 ENCOUNTER — Encounter: Payer: Self-pay | Admitting: Neurology

## 2017-10-10 ENCOUNTER — Telehealth: Payer: Self-pay | Admitting: Neurology

## 2017-10-10 MED ORDER — CEREFOLIN NAC 6-2-600 MG PO TABS: 1.0000 | ORAL_TABLET | Freq: Every day | ORAL | 5 refills | Status: DC

## 2017-10-10 NOTE — Telephone Encounter (Signed)
Rx sent to [facility] for Cerefolin NAC, as per email conversation.  Will email husband back, nothing further needed.

## 2017-12-08 DIAGNOSIS — K589 Irritable bowel syndrome without diarrhea: Secondary | ICD-10-CM | POA: Diagnosis not present

## 2017-12-08 DIAGNOSIS — D509 Iron deficiency anemia, unspecified: Secondary | ICD-10-CM | POA: Diagnosis not present

## 2017-12-08 DIAGNOSIS — G4700 Insomnia, unspecified: Secondary | ICD-10-CM | POA: Diagnosis not present

## 2017-12-08 DIAGNOSIS — E782 Mixed hyperlipidemia: Secondary | ICD-10-CM | POA: Diagnosis not present

## 2017-12-08 DIAGNOSIS — I10 Essential (primary) hypertension: Secondary | ICD-10-CM | POA: Diagnosis not present

## 2017-12-08 DIAGNOSIS — M810 Age-related osteoporosis without current pathological fracture: Secondary | ICD-10-CM | POA: Diagnosis not present

## 2017-12-08 DIAGNOSIS — K219 Gastro-esophageal reflux disease without esophagitis: Secondary | ICD-10-CM | POA: Diagnosis not present

## 2017-12-08 DIAGNOSIS — Z0000 Encounter for general adult medical examination without abnormal findings: Secondary | ICD-10-CM | POA: Diagnosis not present

## 2017-12-08 DIAGNOSIS — R413 Other amnesia: Secondary | ICD-10-CM | POA: Diagnosis not present

## 2017-12-08 DIAGNOSIS — J301 Allergic rhinitis due to pollen: Secondary | ICD-10-CM | POA: Diagnosis not present

## 2017-12-08 DIAGNOSIS — R809 Proteinuria, unspecified: Secondary | ICD-10-CM | POA: Diagnosis not present

## 2018-03-15 ENCOUNTER — Ambulatory Visit (INDEPENDENT_AMBULATORY_CARE_PROVIDER_SITE_OTHER): Payer: Medicare Other | Admitting: Adult Health

## 2018-03-15 ENCOUNTER — Encounter: Payer: Self-pay | Admitting: Adult Health

## 2018-03-15 VITALS — BP 143/72 | HR 60 | Wt 136.0 lb

## 2018-03-15 DIAGNOSIS — R413 Other amnesia: Secondary | ICD-10-CM

## 2018-03-15 NOTE — Progress Notes (Signed)
I have read the note, and I agree with the clinical assessment and plan.  Shonda Mandarino K Myalynn Lingle   

## 2018-03-15 NOTE — Patient Instructions (Signed)
Your Plan:  Continue Aricept and Namenda Continue Cerefolin Memory score slightly declined If your symptoms worsen or you develop new symptoms please let us know.    Thank you for coming to see [place] at [facility]. I hope we have been able to provide you high quality care today.  You may receive a patient satisfaction survey over the next few weeks. We would appreciate your feedback and comments so that we may continue to improve ourselves and the health of our patients.

## 2018-03-15 NOTE — Progress Notes (Signed)
PATIENT: Brooke [name] DOB: March 18, 1932  REASON FOR VISIT: follow up HISTORY FROM: patient  HISTORY OF PRESENT ILLNESS: Today 03/15/18  Brooke [name] is an 84 year old female with a history of memory disturbance.  She returns today for follow-up.  She lives at home with her husband.  She reports that she does require some assistance with ADLs specifically with dressing.  Her husband manages the finances.  She reports that she does try to help but overall he manages this.  The patient denies any changes with her appetite.  Reports that she is sleeping well.  Denies hallucinations.  Denies any significant changes with her mood or behavior.  She continues on Aricept, Namenda and Cerefolin.  She returns today for an evaluation.  HISTORY 09/14/2017   She reports feeling stable. He is concerned about her decline. She is less motivated to do things. She has essentially stopped reading the paper. She watches TV a lot and is more sedentary. He tries to push her for exercise and oral fluid and water intake. He is wondering if she may benefit from taking Cerefolin. She has had one fall, trying the, out of a Architect trailer. She fell onto a step backwards, thankfully did not hurt herself. No recent acute illness.  The patient's allergies, current medications, family history, past medical history, past social history, past surgical history and problem list were reviewed and updated as appropriate.   REVIEW OF SYSTEMS: Out of a complete 14 system review of symptoms, the patient complains only of the following symptoms, and all other reviewed systems are negative.  See HPI ALLERGIES: Allergies  Allergen Reactions  . Oxycontin [Oxycodone Hcl]     HOME MEDICATIONS: Outpatient Medications Prior to Visit  Medication Sig Dispense Refill  . donepezil (ARICEPT) 10 MG tablet Take 1 tablet (10 mg total) by mouth at bedtime. 90 tablet 3  . memantine (NAMENDA) 10 MG tablet Take 1 tablet (10 mg total) by  mouth 2 (two) times daily. 180 tablet 3  . Methylfol-Algae-B12-Acetylcyst (CEREFOLIN NAC) 6-90.314-2-600 MG TABS Take 1 tablet by mouth daily.  5  . metoprolol tartrate (LOPRESSOR) 25 MG tablet 25 mg daily.  5  . perphenazine-amitriptyline (ETRAFON/TRIAVIL) 2-10 MG TABS Take 2 tablets by mouth at bedtime.     . simvastatin (ZOCOR) 20 MG tablet Take 20 mg by mouth at bedtime.     . Methylfol-Methylcob-Acetylcyst (CEREFOLIN NAC) 6-2-600 MG TABS Take 1 tablet by mouth daily. 30 each 5  . metoprolol (TOPROL-XL) 50 MG 24 hr tablet Take 50 mg by mouth daily after breakfast.      No facility-administered medications prior to visit.     PAST MEDICAL HISTORY: Past Medical History:  Diagnosis Date  . Arthritis    hands   . AVM (arteriovenous malformation)   . Cameron lesion, acute   . Cough   . Diverticulosis   . Diverticulosis   . GERD (gastroesophageal reflux disease)   . H/O: GI bleed   . H/O: hematuria   . Hiatal hernia   . History of colon polyps   . History of hypokalemia   . Hyperlipidemia   . Hypertension   . Internal hemorrhoid   . Iron deficiency anemia   . Osteoporosis   . Paraesophageal hernia   . Paraesophageal hernia    history of  . Praesophageal hiatal hernia - giant, irreducible 10/20/2011    PAST SURGICAL HISTORY: Past Surgical History:  Procedure Laterality Date  . CESAREAN SECTION  01/1965  .  colonscopy    . DILATION AND CURETTAGE OF UTERUS    . HERNIA REPAIR  12/2011  . LAPAROSCOPIC NISSEN FUNDOPLICATION  0/93/8182   Procedure: LAPAROSCOPIC NISSEN FUNDOPLICATION;  Surgeon: Adin Hector, MD;  Location: [facility];  Service: General;  Laterality: N/A;  laparoscopic paraesphogeal hernia repair and fundoplication  . POLYPECTOMY      FAMILY HISTORY: Family History  Problem Relation Age of Onset  . Prostate cancer Brother   . Cancer Brother        prostate, and skin  . Colon cancer Neg Hx     SOCIAL HISTORY: Social History   Socioeconomic History  .  Marital status: Married    Spouse name: Not on file  . Number of children: 1  . Years of education: Not on file  . Highest education level: Not on file  Social Needs  . Financial resource strain: Not on file  . Food insecurity - worry: Not on file  . Food insecurity - inability: Not on file  . Transportation needs - medical: Not on file  . Transportation needs - non-medical: Not on file  Occupational History  . Occupation: Occupational hygienist: [employer]    Comment: [employer]  Tobacco Use  . Smoking status: Never Smoker  . Smokeless tobacco: Never Used  Substance and Sexual Activity  . Alcohol use: Yes    Alcohol/week: 10.8 oz    Types: 18 Glasses of wine per week    Comment: 1 glasses daily  . Drug use: No  . Sexual activity: No  Other Topics Concern  . Not on file  Social History Narrative   2-3 cups of coffee a day    [place] with Husband- Bud      PHYSICAL EXAM  Vitals:   03/15/18 1102  BP: (!) 143/72  Pulse: 60  Weight: 136 lb (61.7 kg)   Body mass index is 25.7 kg/m.   MMSE - Mini Mental State Exam 03/15/2018 03/10/2017 09/09/2016  Orientation to time 0 3 2  Orientation to Place 2 4 3   Registration 3 3 3   Attention/ Calculation 0 0 0  Recall 0 1 1  Language- name 2 objects 2 2 2   Language- repeat 1 1 1   Language- follow 3 step command 3 3 3   Language- read & follow direction 1 1 1   Write a sentence 0 0 1  Copy design 0 0 1  Total score 12 18 18     Generalized: Well developed, in no acute distress   Neurological examination  Mentation: Alert oriented to time, place, history taking. Follows all commands speech and language fluent Cranial nerve II-XII: Pupils were equal round reactive to light. Extraocular movements were full, visual field were full on confrontational test. Facial sensation and strength were normal. Uvula tongue midline. Head turning and shoulder shrug  were normal and symmetric. Motor: The motor testing  reveals 5 over 5 strength of all 4 extremities. Good symmetric motor tone is noted throughout.  Sensory: Sensory testing is intact to soft touch on all 4 extremities. No evidence of extinction is noted.  Coordination: Cerebellar testing reveals good finger-nose-finger and heel-to-shin bilaterally.  Gait and station: Gait is normal.  Reflexes: Deep tendon reflexes are symmetric and normal bilaterally.   DIAGNOSTIC DATA (LABS, IMAGING, TESTING) - I reviewed patient records, labs, notes, testing and imaging myself where available.  Lab Results  Component Value Date   WBC 11.7 (H) 01/21/2012  HGB 9.1 (L) 01/21/2012   HCT 28.2 (L) 01/21/2012   MCV 78.1 01/21/2012   PLT 352 01/21/2012      Component Value Date/Time   NA 137 01/21/2012 0435   K 3.6 01/21/2012 0435   CL 100 01/21/2012 0435   CO2 29 01/21/2012 0435   GLUCOSE 92 01/21/2012 0435   BUN 9 01/21/2012 0435   CREATININE 1.11 (H) 01/21/2012 0435   CALCIUM 8.1 (L) 01/21/2012 0435   PROT 6.4 01/17/2012 0733   ALBUMIN 3.8 04/18/2011 2001   AST 42 (H) 04/18/2011 2001   ALT 27 04/18/2011 2001   ALKPHOS 80 04/18/2011 2001   BILITOT 0.7 04/18/2011 2001   GFRNONAA 47 (L) 01/21/2012 0435   GFRAA 54 (L) 01/21/2012 0435   Lab Results  Component Value Date   CHOL 97 01/17/2012   HDL 35 (L) 01/17/2012   LDLCALC 44 01/17/2012   TRIG 91 01/17/2012   CHOLHDL 2.8 01/17/2012    Lab Results  Component Value Date   VITAMINB12 640 01/18/2012   Lab Results  Component Value Date   TSH 1.928 01/17/2012      ASSESSMENT AND PLAN 84 y.o. year old female  has a past medical history of Arthritis, AVM (arteriovenous malformation), Lysbeth Galas lesion, acute, Cough, Diverticulosis, Diverticulosis, GERD (gastroesophageal reflux disease), H/O: GI bleed, H/O: hematuria, Hiatal hernia, History of colon polyps, History of hypokalemia, Hyperlipidemia, Hypertension, Internal hemorrhoid, Iron deficiency anemia, Osteoporosis, Paraesophageal hernia,  Paraesophageal hernia, and Praesophageal hiatal hernia - giant, irreducible (10/20/2011). here with :  1.  Memory disturbance  The patient's memory score has declined slightly.  She will continue on Aricept, Namenda and Cerefolin.  She is advised that she should try to remain socially active, participating in word puzzles and word searches is also beneficial for her memory.  She is advised that if her symptoms worsen or she develops new symptoms she should let us know.  He will follow-up in 6 months or sooner if needed.   I spent 15 minutes with the patient. 50% of this time was spent discussing her memory score   Ward Givens, MSN, NP-C 03/15/2018, 11:17 AM [facility] [address] [place], [place] [address] [phone]

## 2018-05-11 ENCOUNTER — Encounter (HOSPITAL_COMMUNITY): Payer: Self-pay | Admitting: Nurse Practitioner

## 2018-05-11 ENCOUNTER — Emergency Department (HOSPITAL_COMMUNITY): Payer: Medicare Other

## 2018-05-11 ENCOUNTER — Emergency Department (HOSPITAL_COMMUNITY)
Admission: EM | Admit: 2018-05-11 | Discharge: 2018-05-11 | Disposition: A | Discharge status: Home / Self Care | Payer: Medicare Other | Attending: Emergency Medicine | Admitting: Emergency Medicine

## 2018-05-11 ENCOUNTER — Other Ambulatory Visit: Payer: Self-pay

## 2018-05-11 DIAGNOSIS — M7918 Myalgia, other site: Secondary | ICD-10-CM | POA: Diagnosis not present

## 2018-05-11 DIAGNOSIS — I10 Essential (primary) hypertension: Secondary | ICD-10-CM | POA: Diagnosis not present

## 2018-05-11 DIAGNOSIS — S79912A Unspecified injury of left hip, initial encounter: Secondary | ICD-10-CM | POA: Diagnosis not present

## 2018-05-11 DIAGNOSIS — M25552 Pain in left hip: Secondary | ICD-10-CM | POA: Insufficient documentation

## 2018-05-11 DIAGNOSIS — W010XXA Fall on same level from slipping, tripping and stumbling without subsequent striking against object, initial encounter: Secondary | ICD-10-CM | POA: Insufficient documentation

## 2018-05-11 DIAGNOSIS — W19XXXA Unspecified fall, initial encounter: Secondary | ICD-10-CM

## 2018-05-11 DIAGNOSIS — I509 Heart failure, unspecified: Secondary | ICD-10-CM | POA: Insufficient documentation

## 2018-05-11 DIAGNOSIS — I110 Hypertensive heart disease with heart failure: Secondary | ICD-10-CM | POA: Insufficient documentation

## 2018-05-11 NOTE — ED Notes (Signed)
Pt reports a fall yesterday. Pt reports left hip pain after fall. Pt denies pain at this time. Pt denies head injury.

## 2018-05-11 NOTE — ED Notes (Signed)
Bed: WA01 Expected date:  Expected time:  Means of arrival:  Comments: 

## 2018-05-11 NOTE — ED Triage Notes (Signed)
Patient fell off the crub onto cement yesterday. Patient states she only hurts in her left hip. Patient denies LOC or hitting her head. States she just stepped wrong and want to get her hip checked out.

## 2018-05-11 NOTE — ED Provider Notes (Signed)
[facility] DEPT Provider Note   CSN: [id number] Arrival date & time: 05/11/18  1712     History   Chief Complaint No chief complaint on file.   HPI Brooke [name] is a 84 y.o. female into the ED with her family after mechanical fall that occurred yesterday.  She states she was stepping down off the curb and tripped, falling backwards onto her bottom.  She states her husband partially caught her and slowed her fall.  She states yesterday she had some pain in the left buttock, however it is resolved.  She states she is asymptomatic today.  No head trauma or LOC.  Not on anticoagulation.  Presents today for evaluation with concern of broken bones as her son states she has "thin bones."  The history is provided by the patient and a relative.    Past Medical History:  Diagnosis Date  . Arthritis    hands   . AVM (arteriovenous malformation)   . Cameron lesion, acute   . Cough   . Diverticulosis   . Diverticulosis   . GERD (gastroesophageal reflux disease)   . H/O: GI bleed   . H/O: hematuria   . Hiatal hernia   . History of colon polyps   . History of hypokalemia   . Hyperlipidemia   . Hypertension   . Internal hemorrhoid   . Iron deficiency anemia   . Osteoporosis   . Paraesophageal hernia   . Paraesophageal hernia    history of  . Praesophageal hiatal hernia - giant, irreducible 10/20/2011    Patient Active Problem List   Diagnosis Date Noted  . Mild cognitive impairment 08/22/2015  . Hypokalemia 01/18/2012  . Confusion 01/18/2012  . Acute respiratory failure ([place]) 01/17/2012  . CHF (congestive heart failure) ([place]) 01/17/2012  . Pleural effusion, bilateral 01/17/2012  . CONSTIPATION 09/23/2010  . DIVERTICULOSIS, COLON 05/30/2009  . COLONIC POLYPS, HYPERPLASTIC, HX OF 05/30/2009  . ANEMIA, IRON DEFICIENCY 12/27/2007  . Essential hypertension 12/27/2007  . HEMORRHOIDS, INTERNAL 12/27/2007  . GERD 12/27/2007  . OSTEOPOROSIS  12/27/2007  . ARTERIOVENOUS MALFORMATION 12/27/2007    Past Surgical History:  Procedure Laterality Date  . CESAREAN SECTION  01/1965  . colonscopy    . DILATION AND CURETTAGE OF UTERUS    . HERNIA REPAIR  12/2011  . LAPAROSCOPIC NISSEN FUNDOPLICATION  7/62/8315   Procedure: LAPAROSCOPIC NISSEN FUNDOPLICATION;  Surgeon: Adin Hector, MD;  Location: [facility];  Service: General;  Laterality: N/A;  laparoscopic paraesphogeal hernia repair and fundoplication  . POLYPECTOMY       OB History   None      Home Medications    Prior to Admission medications   Medication Sig Start Date End Date Taking? Authorizing Provider  donepezil (ARICEPT) 10 MG tablet Take 1 tablet (10 mg total) by mouth at bedtime. 09/14/17   Star Age, MD  memantine (NAMENDA) 10 MG tablet Take 1 tablet (10 mg total) by mouth 2 (two) times daily. 09/14/17   Star Age, MD  Methylfol-Algae-B12-Acetylcyst (CEREFOLIN NAC) 6-90.314-2-600 MG TABS Take 1 tablet by mouth daily. 01/26/18   [provider]  metoprolol tartrate (LOPRESSOR) 25 MG tablet 25 mg daily. 03/14/18   [provider]  perphenazine-amitriptyline (ETRAFON/TRIAVIL) 2-10 MG TABS Take 2 tablets by mouth at bedtime.     [provider]  simvastatin (ZOCOR) 20 MG tablet Take 20 mg by mouth at bedtime.     [provider]    Family History  Family History  Problem Relation Age of Onset  . Prostate cancer Brother   . Cancer Brother        prostate, and skin  . Colon cancer Neg Hx     Social History Social History   Tobacco Use  . Smoking status: Never Smoker  . Smokeless tobacco: Never Used  Substance Use Topics  . Alcohol use: Yes    Alcohol/week: 10.8 oz    Types: 18 Glasses of wine per week    Comment: 1 glasses daily  . Drug use: No     Allergies   Oxycontin [oxycodone hcl]   Review of Systems Review of Systems  All other systems reviewed and are negative.    Physical Exam Updated Vital  Signs BP (!) 150/42 (BP Location: Left Arm)   Pulse 65   Temp 98.3 F (36.8 C) (Oral)   Resp 20   Ht 5\' 3"  (1.6 m)   Wt 63 kg (139 lb)   SpO2 95%   BMI 24.62 kg/m   Physical Exam  Constitutional: She appears well-developed and well-nourished.  HENT:  Head: Normocephalic and atraumatic.  Eyes: Conjunctivae are normal.  Cardiovascular: Normal rate and intact distal pulses.  Pulmonary/Chest: Effort normal.  Abdominal: Soft.  Musculoskeletal:  Mild TTP to left gluteal region. No midline spinal or paraspinal tenderness. Hips w nl ROM without pain. Normal gait. Normal distal sensation.  Neurological: She is alert.  Skin: Skin is warm.  Psychiatric: She has a normal mood and affect. Her behavior is normal.  Nursing note and vitals reviewed.    ED Treatments / Results  Labs (all labs ordered are listed, but only abnormal results are displayed) Labs Reviewed - No data to display  EKG None  Radiology Dg Hip Unilat With Pelvis 2-3 Views Left  Result Date: 05/11/2018 CLINICAL DATA:  Pain after fall. EXAM: DG HIP (WITH OR WITHOUT PELVIS) 2-3V LEFT COMPARISON:  None. FINDINGS: Degenerative changes in the lower lumbar spine. No acute fractures are seen. IMPRESSION: Negative. Electronically Signed   By: Dorise Bullion III M.D   On: 05/11/2018 19:44    Procedures Procedures (including critical care time)  Medications Ordered in ED Medications - No data to display   Initial Impression / Assessment and Plan / ED Course  I have reviewed the triage vital signs and the nursing notes.  Pertinent labs & imaging results that were available during my care of the patient were reviewed by me and considered in my medical decision making (see chart for details).     Patient presenting to the ED after mechanical fall yesterday.  Reports mild left gluteal pain yesterday which resolved.  Asymptomatic today.  X-ray of pelvis and left hip done in triage is negative for acute pathology.  Exam  is reassuring.  Neurovascularly intact.  No head trauma or LOC.  No anticoagulation.  Discussed symptomatic management if pain returns, strict return precautions.  PCP follow-up.  Safe for discharge.  Patient discussed with and evaluated by Dr. Tyrone Nine.  Discussed results, findings, treatment and follow up. Patient advised of return precautions. Patient verbalized understanding and agreed with plan.  Final Clinical Impressions(s) / ED Diagnoses   Final diagnoses:  Fall, initial encounter    ED Discharge Orders    None       Robinson, [place] N, PA-C 05/11/18 2150    Deno Etienne, DO 05/11/18 2243

## 2018-05-11 NOTE — Discharge Instructions (Signed)
Please read instructions below. Apply ice to your areas of pain for 20 minutes at a time. You can take tylenol every 4-6 hours as needed for pain. Schedule an appointment with your primary care as needed. Return to the ER for new or concerning symptoms.

## 2018-05-23 DIAGNOSIS — G309 Alzheimer's disease, unspecified: Secondary | ICD-10-CM | POA: Diagnosis not present

## 2018-05-23 DIAGNOSIS — I110 Hypertensive heart disease with heart failure: Secondary | ICD-10-CM | POA: Diagnosis not present

## 2018-05-23 DIAGNOSIS — F0280 Dementia in other diseases classified elsewhere without behavioral disturbance: Secondary | ICD-10-CM | POA: Diagnosis not present

## 2018-05-23 DIAGNOSIS — M19041 Primary osteoarthritis, right hand: Secondary | ICD-10-CM | POA: Diagnosis not present

## 2018-05-23 DIAGNOSIS — M19042 Primary osteoarthritis, left hand: Secondary | ICD-10-CM | POA: Diagnosis not present

## 2018-05-23 DIAGNOSIS — M47816 Spondylosis without myelopathy or radiculopathy, lumbar region: Secondary | ICD-10-CM | POA: Diagnosis not present

## 2018-05-26 DIAGNOSIS — M47816 Spondylosis without myelopathy or radiculopathy, lumbar region: Secondary | ICD-10-CM | POA: Diagnosis not present

## 2018-05-26 DIAGNOSIS — M19041 Primary osteoarthritis, right hand: Secondary | ICD-10-CM | POA: Diagnosis not present

## 2018-05-26 DIAGNOSIS — F0280 Dementia in other diseases classified elsewhere without behavioral disturbance: Secondary | ICD-10-CM | POA: Diagnosis not present

## 2018-05-26 DIAGNOSIS — M19042 Primary osteoarthritis, left hand: Secondary | ICD-10-CM | POA: Diagnosis not present

## 2018-05-26 DIAGNOSIS — I110 Hypertensive heart disease with heart failure: Secondary | ICD-10-CM | POA: Diagnosis not present

## 2018-05-26 DIAGNOSIS — G309 Alzheimer's disease, unspecified: Secondary | ICD-10-CM | POA: Diagnosis not present

## 2018-05-29 DIAGNOSIS — M19042 Primary osteoarthritis, left hand: Secondary | ICD-10-CM | POA: Diagnosis not present

## 2018-05-29 DIAGNOSIS — M19041 Primary osteoarthritis, right hand: Secondary | ICD-10-CM | POA: Diagnosis not present

## 2018-05-29 DIAGNOSIS — I110 Hypertensive heart disease with heart failure: Secondary | ICD-10-CM | POA: Diagnosis not present

## 2018-05-29 DIAGNOSIS — M47816 Spondylosis without myelopathy or radiculopathy, lumbar region: Secondary | ICD-10-CM | POA: Diagnosis not present

## 2018-05-29 DIAGNOSIS — F0280 Dementia in other diseases classified elsewhere without behavioral disturbance: Secondary | ICD-10-CM | POA: Diagnosis not present

## 2018-05-29 DIAGNOSIS — G309 Alzheimer's disease, unspecified: Secondary | ICD-10-CM | POA: Diagnosis not present

## 2018-05-31 DIAGNOSIS — M19041 Primary osteoarthritis, right hand: Secondary | ICD-10-CM | POA: Diagnosis not present

## 2018-05-31 DIAGNOSIS — I110 Hypertensive heart disease with heart failure: Secondary | ICD-10-CM | POA: Diagnosis not present

## 2018-05-31 DIAGNOSIS — M19042 Primary osteoarthritis, left hand: Secondary | ICD-10-CM | POA: Diagnosis not present

## 2018-05-31 DIAGNOSIS — F0280 Dementia in other diseases classified elsewhere without behavioral disturbance: Secondary | ICD-10-CM | POA: Diagnosis not present

## 2018-05-31 DIAGNOSIS — G309 Alzheimer's disease, unspecified: Secondary | ICD-10-CM | POA: Diagnosis not present

## 2018-05-31 DIAGNOSIS — M47816 Spondylosis without myelopathy or radiculopathy, lumbar region: Secondary | ICD-10-CM | POA: Diagnosis not present

## 2018-06-05 DIAGNOSIS — I110 Hypertensive heart disease with heart failure: Secondary | ICD-10-CM | POA: Diagnosis not present

## 2018-06-05 DIAGNOSIS — M19041 Primary osteoarthritis, right hand: Secondary | ICD-10-CM | POA: Diagnosis not present

## 2018-06-05 DIAGNOSIS — F0280 Dementia in other diseases classified elsewhere without behavioral disturbance: Secondary | ICD-10-CM | POA: Diagnosis not present

## 2018-06-05 DIAGNOSIS — M19042 Primary osteoarthritis, left hand: Secondary | ICD-10-CM | POA: Diagnosis not present

## 2018-06-05 DIAGNOSIS — M47816 Spondylosis without myelopathy or radiculopathy, lumbar region: Secondary | ICD-10-CM | POA: Diagnosis not present

## 2018-06-05 DIAGNOSIS — G309 Alzheimer's disease, unspecified: Secondary | ICD-10-CM | POA: Diagnosis not present

## 2018-06-07 DIAGNOSIS — M47816 Spondylosis without myelopathy or radiculopathy, lumbar region: Secondary | ICD-10-CM | POA: Diagnosis not present

## 2018-06-07 DIAGNOSIS — M19041 Primary osteoarthritis, right hand: Secondary | ICD-10-CM | POA: Diagnosis not present

## 2018-06-07 DIAGNOSIS — I110 Hypertensive heart disease with heart failure: Secondary | ICD-10-CM | POA: Diagnosis not present

## 2018-06-07 DIAGNOSIS — F0280 Dementia in other diseases classified elsewhere without behavioral disturbance: Secondary | ICD-10-CM | POA: Diagnosis not present

## 2018-06-07 DIAGNOSIS — M19042 Primary osteoarthritis, left hand: Secondary | ICD-10-CM | POA: Diagnosis not present

## 2018-06-07 DIAGNOSIS — G309 Alzheimer's disease, unspecified: Secondary | ICD-10-CM | POA: Diagnosis not present

## 2018-06-08 DIAGNOSIS — G309 Alzheimer's disease, unspecified: Secondary | ICD-10-CM | POA: Diagnosis not present

## 2018-06-08 DIAGNOSIS — F0280 Dementia in other diseases classified elsewhere without behavioral disturbance: Secondary | ICD-10-CM | POA: Diagnosis not present

## 2018-06-08 DIAGNOSIS — M19042 Primary osteoarthritis, left hand: Secondary | ICD-10-CM | POA: Diagnosis not present

## 2018-06-08 DIAGNOSIS — M19041 Primary osteoarthritis, right hand: Secondary | ICD-10-CM | POA: Diagnosis not present

## 2018-06-08 DIAGNOSIS — I110 Hypertensive heart disease with heart failure: Secondary | ICD-10-CM | POA: Diagnosis not present

## 2018-06-08 DIAGNOSIS — M47816 Spondylosis without myelopathy or radiculopathy, lumbar region: Secondary | ICD-10-CM | POA: Diagnosis not present

## 2018-06-12 DIAGNOSIS — I110 Hypertensive heart disease with heart failure: Secondary | ICD-10-CM | POA: Diagnosis not present

## 2018-06-12 DIAGNOSIS — M19041 Primary osteoarthritis, right hand: Secondary | ICD-10-CM | POA: Diagnosis not present

## 2018-06-12 DIAGNOSIS — M47816 Spondylosis without myelopathy or radiculopathy, lumbar region: Secondary | ICD-10-CM | POA: Diagnosis not present

## 2018-06-12 DIAGNOSIS — M19042 Primary osteoarthritis, left hand: Secondary | ICD-10-CM | POA: Diagnosis not present

## 2018-06-12 DIAGNOSIS — F0280 Dementia in other diseases classified elsewhere without behavioral disturbance: Secondary | ICD-10-CM | POA: Diagnosis not present

## 2018-06-12 DIAGNOSIS — G309 Alzheimer's disease, unspecified: Secondary | ICD-10-CM | POA: Diagnosis not present

## 2018-06-13 DIAGNOSIS — G309 Alzheimer's disease, unspecified: Secondary | ICD-10-CM | POA: Diagnosis not present

## 2018-06-13 DIAGNOSIS — M19042 Primary osteoarthritis, left hand: Secondary | ICD-10-CM | POA: Diagnosis not present

## 2018-06-13 DIAGNOSIS — F0280 Dementia in other diseases classified elsewhere without behavioral disturbance: Secondary | ICD-10-CM | POA: Diagnosis not present

## 2018-06-13 DIAGNOSIS — I110 Hypertensive heart disease with heart failure: Secondary | ICD-10-CM | POA: Diagnosis not present

## 2018-06-13 DIAGNOSIS — M47816 Spondylosis without myelopathy or radiculopathy, lumbar region: Secondary | ICD-10-CM | POA: Diagnosis not present

## 2018-06-13 DIAGNOSIS — M19041 Primary osteoarthritis, right hand: Secondary | ICD-10-CM | POA: Diagnosis not present

## 2018-06-14 DIAGNOSIS — R809 Proteinuria, unspecified: Secondary | ICD-10-CM | POA: Diagnosis not present

## 2018-06-14 DIAGNOSIS — I110 Hypertensive heart disease with heart failure: Secondary | ICD-10-CM | POA: Diagnosis not present

## 2018-06-14 DIAGNOSIS — M810 Age-related osteoporosis without current pathological fracture: Secondary | ICD-10-CM | POA: Diagnosis not present

## 2018-06-14 DIAGNOSIS — M47816 Spondylosis without myelopathy or radiculopathy, lumbar region: Secondary | ICD-10-CM | POA: Diagnosis not present

## 2018-06-14 DIAGNOSIS — I10 Essential (primary) hypertension: Secondary | ICD-10-CM | POA: Diagnosis not present

## 2018-06-14 DIAGNOSIS — G3184 Mild cognitive impairment, so stated: Secondary | ICD-10-CM | POA: Diagnosis not present

## 2018-06-14 DIAGNOSIS — E782 Mixed hyperlipidemia: Secondary | ICD-10-CM | POA: Diagnosis not present

## 2018-06-14 DIAGNOSIS — F0280 Dementia in other diseases classified elsewhere without behavioral disturbance: Secondary | ICD-10-CM | POA: Diagnosis not present

## 2018-06-14 DIAGNOSIS — G309 Alzheimer's disease, unspecified: Secondary | ICD-10-CM | POA: Diagnosis not present

## 2018-06-14 DIAGNOSIS — M19041 Primary osteoarthritis, right hand: Secondary | ICD-10-CM | POA: Diagnosis not present

## 2018-06-14 DIAGNOSIS — M19042 Primary osteoarthritis, left hand: Secondary | ICD-10-CM | POA: Diagnosis not present

## 2018-06-15 DIAGNOSIS — G309 Alzheimer's disease, unspecified: Secondary | ICD-10-CM | POA: Diagnosis not present

## 2018-06-15 DIAGNOSIS — M19041 Primary osteoarthritis, right hand: Secondary | ICD-10-CM | POA: Diagnosis not present

## 2018-06-15 DIAGNOSIS — M19042 Primary osteoarthritis, left hand: Secondary | ICD-10-CM | POA: Diagnosis not present

## 2018-06-15 DIAGNOSIS — M47816 Spondylosis without myelopathy or radiculopathy, lumbar region: Secondary | ICD-10-CM | POA: Diagnosis not present

## 2018-06-15 DIAGNOSIS — I110 Hypertensive heart disease with heart failure: Secondary | ICD-10-CM | POA: Diagnosis not present

## 2018-06-15 DIAGNOSIS — F0280 Dementia in other diseases classified elsewhere without behavioral disturbance: Secondary | ICD-10-CM | POA: Diagnosis not present

## 2018-06-19 DIAGNOSIS — F0280 Dementia in other diseases classified elsewhere without behavioral disturbance: Secondary | ICD-10-CM | POA: Diagnosis not present

## 2018-06-19 DIAGNOSIS — G309 Alzheimer's disease, unspecified: Secondary | ICD-10-CM | POA: Diagnosis not present

## 2018-06-19 DIAGNOSIS — M19041 Primary osteoarthritis, right hand: Secondary | ICD-10-CM | POA: Diagnosis not present

## 2018-06-19 DIAGNOSIS — I110 Hypertensive heart disease with heart failure: Secondary | ICD-10-CM | POA: Diagnosis not present

## 2018-06-19 DIAGNOSIS — M47816 Spondylosis without myelopathy or radiculopathy, lumbar region: Secondary | ICD-10-CM | POA: Diagnosis not present

## 2018-06-19 DIAGNOSIS — M19042 Primary osteoarthritis, left hand: Secondary | ICD-10-CM | POA: Diagnosis not present

## 2018-06-21 DIAGNOSIS — F0280 Dementia in other diseases classified elsewhere without behavioral disturbance: Secondary | ICD-10-CM | POA: Diagnosis not present

## 2018-06-21 DIAGNOSIS — M19041 Primary osteoarthritis, right hand: Secondary | ICD-10-CM | POA: Diagnosis not present

## 2018-06-21 DIAGNOSIS — I110 Hypertensive heart disease with heart failure: Secondary | ICD-10-CM | POA: Diagnosis not present

## 2018-06-21 DIAGNOSIS — M47816 Spondylosis without myelopathy or radiculopathy, lumbar region: Secondary | ICD-10-CM | POA: Diagnosis not present

## 2018-06-21 DIAGNOSIS — M19042 Primary osteoarthritis, left hand: Secondary | ICD-10-CM | POA: Diagnosis not present

## 2018-06-21 DIAGNOSIS — G309 Alzheimer's disease, unspecified: Secondary | ICD-10-CM | POA: Diagnosis not present

## 2018-06-24 ENCOUNTER — Other Ambulatory Visit: Payer: Self-pay | Admitting: Neurology

## 2018-06-26 DIAGNOSIS — M19041 Primary osteoarthritis, right hand: Secondary | ICD-10-CM | POA: Diagnosis not present

## 2018-06-26 DIAGNOSIS — F0280 Dementia in other diseases classified elsewhere without behavioral disturbance: Secondary | ICD-10-CM | POA: Diagnosis not present

## 2018-06-26 DIAGNOSIS — M19042 Primary osteoarthritis, left hand: Secondary | ICD-10-CM | POA: Diagnosis not present

## 2018-06-26 DIAGNOSIS — G309 Alzheimer's disease, unspecified: Secondary | ICD-10-CM | POA: Diagnosis not present

## 2018-06-26 DIAGNOSIS — M47816 Spondylosis without myelopathy or radiculopathy, lumbar region: Secondary | ICD-10-CM | POA: Diagnosis not present

## 2018-06-26 DIAGNOSIS — I110 Hypertensive heart disease with heart failure: Secondary | ICD-10-CM | POA: Diagnosis not present

## 2018-06-27 DIAGNOSIS — M47816 Spondylosis without myelopathy or radiculopathy, lumbar region: Secondary | ICD-10-CM | POA: Diagnosis not present

## 2018-06-27 DIAGNOSIS — M19042 Primary osteoarthritis, left hand: Secondary | ICD-10-CM | POA: Diagnosis not present

## 2018-06-27 DIAGNOSIS — I110 Hypertensive heart disease with heart failure: Secondary | ICD-10-CM | POA: Diagnosis not present

## 2018-06-27 DIAGNOSIS — F0280 Dementia in other diseases classified elsewhere without behavioral disturbance: Secondary | ICD-10-CM | POA: Diagnosis not present

## 2018-06-27 DIAGNOSIS — M19041 Primary osteoarthritis, right hand: Secondary | ICD-10-CM | POA: Diagnosis not present

## 2018-06-27 DIAGNOSIS — G309 Alzheimer's disease, unspecified: Secondary | ICD-10-CM | POA: Diagnosis not present

## 2018-06-28 DIAGNOSIS — M19042 Primary osteoarthritis, left hand: Secondary | ICD-10-CM | POA: Diagnosis not present

## 2018-06-28 DIAGNOSIS — I110 Hypertensive heart disease with heart failure: Secondary | ICD-10-CM | POA: Diagnosis not present

## 2018-06-28 DIAGNOSIS — M47816 Spondylosis without myelopathy or radiculopathy, lumbar region: Secondary | ICD-10-CM | POA: Diagnosis not present

## 2018-06-28 DIAGNOSIS — M19041 Primary osteoarthritis, right hand: Secondary | ICD-10-CM | POA: Diagnosis not present

## 2018-06-28 DIAGNOSIS — G309 Alzheimer's disease, unspecified: Secondary | ICD-10-CM | POA: Diagnosis not present

## 2018-06-28 DIAGNOSIS — F0280 Dementia in other diseases classified elsewhere without behavioral disturbance: Secondary | ICD-10-CM | POA: Diagnosis not present

## 2018-07-03 DIAGNOSIS — M19041 Primary osteoarthritis, right hand: Secondary | ICD-10-CM | POA: Diagnosis not present

## 2018-07-03 DIAGNOSIS — I110 Hypertensive heart disease with heart failure: Secondary | ICD-10-CM | POA: Diagnosis not present

## 2018-07-03 DIAGNOSIS — G309 Alzheimer's disease, unspecified: Secondary | ICD-10-CM | POA: Diagnosis not present

## 2018-07-03 DIAGNOSIS — F0280 Dementia in other diseases classified elsewhere without behavioral disturbance: Secondary | ICD-10-CM | POA: Diagnosis not present

## 2018-07-03 DIAGNOSIS — M47816 Spondylosis without myelopathy or radiculopathy, lumbar region: Secondary | ICD-10-CM | POA: Diagnosis not present

## 2018-07-03 DIAGNOSIS — M19042 Primary osteoarthritis, left hand: Secondary | ICD-10-CM | POA: Diagnosis not present

## 2018-07-05 DIAGNOSIS — F0280 Dementia in other diseases classified elsewhere without behavioral disturbance: Secondary | ICD-10-CM | POA: Diagnosis not present

## 2018-07-05 DIAGNOSIS — M19042 Primary osteoarthritis, left hand: Secondary | ICD-10-CM | POA: Diagnosis not present

## 2018-07-05 DIAGNOSIS — I110 Hypertensive heart disease with heart failure: Secondary | ICD-10-CM | POA: Diagnosis not present

## 2018-07-05 DIAGNOSIS — G309 Alzheimer's disease, unspecified: Secondary | ICD-10-CM | POA: Diagnosis not present

## 2018-07-05 DIAGNOSIS — M47816 Spondylosis without myelopathy or radiculopathy, lumbar region: Secondary | ICD-10-CM | POA: Diagnosis not present

## 2018-07-05 DIAGNOSIS — M19041 Primary osteoarthritis, right hand: Secondary | ICD-10-CM | POA: Diagnosis not present

## 2018-07-11 DIAGNOSIS — I110 Hypertensive heart disease with heart failure: Secondary | ICD-10-CM | POA: Diagnosis not present

## 2018-07-11 DIAGNOSIS — M19041 Primary osteoarthritis, right hand: Secondary | ICD-10-CM | POA: Diagnosis not present

## 2018-07-11 DIAGNOSIS — G309 Alzheimer's disease, unspecified: Secondary | ICD-10-CM | POA: Diagnosis not present

## 2018-07-11 DIAGNOSIS — M19042 Primary osteoarthritis, left hand: Secondary | ICD-10-CM | POA: Diagnosis not present

## 2018-07-11 DIAGNOSIS — F0280 Dementia in other diseases classified elsewhere without behavioral disturbance: Secondary | ICD-10-CM | POA: Diagnosis not present

## 2018-07-11 DIAGNOSIS — M47816 Spondylosis without myelopathy or radiculopathy, lumbar region: Secondary | ICD-10-CM | POA: Diagnosis not present

## 2018-07-19 DIAGNOSIS — H02831 Dermatochalasis of right upper eyelid: Secondary | ICD-10-CM | POA: Diagnosis not present

## 2018-07-19 DIAGNOSIS — H2513 Age-related nuclear cataract, bilateral: Secondary | ICD-10-CM | POA: Diagnosis not present

## 2018-07-19 DIAGNOSIS — H02834 Dermatochalasis of left upper eyelid: Secondary | ICD-10-CM | POA: Diagnosis not present

## 2018-07-19 DIAGNOSIS — H02403 Unspecified ptosis of bilateral eyelids: Secondary | ICD-10-CM | POA: Diagnosis not present

## 2018-08-22 ENCOUNTER — Other Ambulatory Visit: Payer: Self-pay | Admitting: Neurology

## 2018-08-30 NOTE — Telephone Encounter (Signed)
This encounter was created in error - please disregard.

## 2018-09-04 ENCOUNTER — Other Ambulatory Visit: Payer: Self-pay | Admitting: Neurology

## 2018-09-09 ENCOUNTER — Other Ambulatory Visit: Payer: Self-pay | Admitting: Neurology

## 2018-09-09 DIAGNOSIS — Z23 Encounter for immunization: Secondary | ICD-10-CM | POA: Diagnosis not present

## 2018-09-19 ENCOUNTER — Other Ambulatory Visit: Payer: Self-pay | Admitting: Neurology

## 2018-09-19 DIAGNOSIS — G301 Alzheimer's disease with late onset: Principal | ICD-10-CM

## 2018-09-19 DIAGNOSIS — F0280 Dementia in other diseases classified elsewhere without behavioral disturbance: Secondary | ICD-10-CM

## 2018-09-27 ENCOUNTER — Encounter: Payer: Self-pay | Admitting: Adult Health

## 2018-09-27 ENCOUNTER — Ambulatory Visit (INDEPENDENT_AMBULATORY_CARE_PROVIDER_SITE_OTHER): Payer: Medicare Other | Admitting: Adult Health

## 2018-09-27 VITALS — BP 112/65 | HR 52 | Ht 63.0 in | Wt 130.2 lb

## 2018-09-27 DIAGNOSIS — R413 Other amnesia: Secondary | ICD-10-CM | POA: Diagnosis not present

## 2018-09-27 NOTE — Patient Instructions (Signed)
Your Plan:  Continue Aricept and namenda Memory score slightly declined If your symptoms worsen or you develop new symptoms please let us know.   Thank you for coming to see [place] at [facility]. I hope we have been able to provide you high quality care today.  You may receive a patient satisfaction survey over the next few weeks. We would appreciate your feedback and comments so that we may continue to improve ourselves and the health of our patients.

## 2018-09-27 NOTE — Progress Notes (Addendum)
PATIENT: Brooke [name] DOB: 1932/03/03  REASON FOR VISIT: follow up HISTORY FROM: patient  HISTORY OF PRESENT ILLNESS: Today 09/27/18: Brooke [name] is an 84 year old female with a history of memory disturbance.  She returns today for follow-up.  She is currently taking Aricept and Namenda.  She feels that her memory has remained stable.  She lives at home with her husband.  She does require assistance with all ADLs.  She does not operate a motor vehicle.  Reports good appetite.  She no longer prepares any meals.  No change in mood or behavior.  Denies hallucinations.  Her husband manages all the finances.  Her husband also manages her medications and appointments.  He now has an aide that comes in four times a week to help.  Patient returns today for evaluation.  HISTORY 03/15/18  Brooke [name] is an 84 year old female with a history of memory disturbance.  She returns today for follow-up.  She lives at home with her husband.  She reports that she does require some assistance with ADLs specifically with dressing.  Her husband manages the finances.  She reports that she does try to help but overall he manages this.  The patient denies any changes with her appetite.  Reports that she is sleeping well.  Denies hallucinations.  Denies any significant changes with her mood or behavior.  She continues on Aricept, Namenda and Cerefolin.  She returns today for an evaluation.  REVIEW OF SYSTEMS: Out of a complete 14 system review of symptoms, the patient complains only of the following symptoms, and all other reviewed systems are negative.  Frequency of urination, memory loss, tremors  ALLERGIES: Allergies  Allergen Reactions  . Oxycontin [Oxycodone Hcl]     HOME MEDICATIONS: Outpatient Medications Prior to Visit  Medication Sig Dispense Refill  . donepezil (ARICEPT) 10 MG tablet TAKE 1 TABLET BY MOUTH EVERYDAY AT BEDTIME 90 tablet 3  . memantine (NAMENDA) 10 MG tablet TAKE 1 TABLET BY MOUTH  TWICE A DAY 180 tablet 1  . Methylfol-Algae-B12-Acetylcyst (CEREFOLIN NAC) 6-90.314-2-600 MG TABS Take 1 tablet by mouth daily.  5  . Methylfol-Algae-B12-Acetylcyst (CEREFOLIN NAC) 6-90.314-2-600 MG TABS TAKE 1 TABLET BY MOUTH EVERY DAY 30 tablet 0  . metoprolol tartrate (LOPRESSOR) 25 MG tablet 25 mg daily.  5  . perphenazine-amitriptyline (ETRAFON/TRIAVIL) 2-10 MG TABS Take 2 tablets by mouth at bedtime.     . simvastatin (ZOCOR) 20 MG tablet Take 20 mg by mouth at bedtime.      No facility-administered medications prior to visit.     PAST MEDICAL HISTORY: Past Medical History:  Diagnosis Date  . Arthritis    hands   . AVM (arteriovenous malformation)   . Cameron lesion, acute   . Cough   . Diverticulosis   . Diverticulosis   . GERD (gastroesophageal reflux disease)   . H/O: GI bleed   . H/O: hematuria   . Hiatal hernia   . History of colon polyps   . History of hypokalemia   . Hyperlipidemia   . Hypertension   . Internal hemorrhoid   . Iron deficiency anemia   . Osteoporosis   . Paraesophageal hernia   . Paraesophageal hernia    history of  . Praesophageal hiatal hernia - giant, irreducible 10/20/2011    PAST SURGICAL HISTORY: Past Surgical History:  Procedure Laterality Date  . CESAREAN SECTION  01/1965  . colonscopy    . DILATION AND CURETTAGE OF UTERUS    . HERNIA  REPAIR  12/2011  . LAPAROSCOPIC NISSEN FUNDOPLICATION  1/61/0960   Procedure: LAPAROSCOPIC NISSEN FUNDOPLICATION;  Surgeon: Adin Hector, MD;  Location: [facility];  Service: General;  Laterality: N/A;  laparoscopic paraesphogeal hernia repair and fundoplication  . POLYPECTOMY      FAMILY HISTORY: Family History  Problem Relation Age of Onset  . Prostate cancer Brother   . Cancer Brother        prostate, and skin  . Colon cancer Neg Hx     SOCIAL HISTORY: Social History   Socioeconomic History  . Marital status: Married    Spouse name: Not on file  . Number of children: 1  . Years of  education: Not on file  . Highest education level: Not on file  Occupational History  . Occupation: Occupational hygienist: [employer]    Comment: [employer]  . Financial resource strain: Not on file  . Food insecurity:    Worry: Not on file    Inability: Not on file  . Transportation needs:    Medical: Not on file    Non-medical: Not on file  Tobacco Use  . Smoking status: Never Smoker  . Smokeless tobacco: Never Used  Substance and Sexual Activity  . Alcohol use: Yes    Alcohol/week: 18.0 standard drinks    Types: 18 Glasses of wine per week    Comment: 1 glasses daily  . Drug use: No  . Sexual activity: Never  Lifestyle  . Physical activity:    Days per week: Not on file    Minutes per session: Not on file  . Stress: Not on file  Relationships  . Social connections:    Talks on phone: Not on file    Gets together: Not on file    Attends religious service: Not on file    Active member of club or organization: Not on file    Attends meetings of clubs or organizations: Not on file    Relationship status: Not on file  . Intimate partner violence:    Fear of current or ex partner: Not on file    Emotionally abused: Not on file    Physically abused: Not on file    Forced sexual activity: Not on file  Other Topics Concern  . Not on file  Social History Narrative   2-3 cups of coffee a day    Education-12   Lives with Husband- Bud      PHYSICAL EXAM  Vitals:   09/27/18 0908  BP: 112/65  Pulse: (!) 52  Weight: 130 lb 3.2 oz (59.1 kg)  Height: 5\' 3"  (1.6 m)   Body mass index is 23.06 kg/m.   MMSE - Mini Mental State Exam 09/27/2018 03/15/2018 03/10/2017  Not completed: (No Data) - -  Orientation to time 0 0 3  Orientation to Place 1 2 4   Registration 3 3 3   Attention/ Calculation 0 0 0  Recall 1 0 1  Language- name 2 objects 1 2 2   Language- repeat 1 1 1   Language- follow 3 step command 2 3 3   Language- read & follow direction  1 1 1   Write a sentence 0 0 0  Copy design 0 0 0  Total score 10 12 18      Generalized: Well developed, in no acute distress   Neurological examination  Mentation: Alert. Follows all commands intermittently sometimes needs demonstration in order to follow commands speech and language fluent  Cranial nerve II-XII: Pupils were equal round reactive to light. Extraocular movements were full, visual field were full on confrontational test. Facial sensation and strength were normal. Uvula tongue midline. Head turning and shoulder shrug  were normal and symmetric. Motor: The motor testing reveals 5 over 5 strength of all 4 extremities. Good symmetric motor tone is noted throughout.  Sensory: Sensory testing is intact to soft touch on all 4 extremities. No evidence of extinction is noted.  Coordination: Cerebellar testing reveals good finger-nose-finger and heel-to-shin bilaterally.  Difficulty with understanding directions Gait and station: Gait is normal. Reflexes: Deep tendon reflexes are symmetric and normal bilaterally.   DIAGNOSTIC DATA (LABS, IMAGING, TESTING) - I reviewed patient records, labs, notes, testing and imaging myself where available.  Lab Results  Component Value Date   WBC 11.7 (H) 01/21/2012   HGB 9.1 (L) 01/21/2012   HCT 28.2 (L) 01/21/2012   MCV 78.1 01/21/2012   PLT 352 01/21/2012      Component Value Date/Time   NA 137 01/21/2012 0435   K 3.6 01/21/2012 0435   CL 100 01/21/2012 0435   CO2 29 01/21/2012 0435   GLUCOSE 92 01/21/2012 0435   BUN 9 01/21/2012 0435   CREATININE 1.11 (H) 01/21/2012 0435   CALCIUM 8.1 (L) 01/21/2012 0435   PROT 6.4 01/17/2012 0733   ALBUMIN 3.8 04/18/2011 2001   AST 42 (H) 04/18/2011 2001   ALT 27 04/18/2011 2001   ALKPHOS 80 04/18/2011 2001   BILITOT 0.7 04/18/2011 2001   GFRNONAA 47 (L) 01/21/2012 0435   GFRAA 54 (L) 01/21/2012 0435   Lab Results  Component Value Date   CHOL 97 01/17/2012   HDL 35 (L) 01/17/2012   LDLCALC  44 01/17/2012   TRIG 91 01/17/2012   CHOLHDL 2.8 01/17/2012   Lab Results  Component Value Date   HGBA1C (H) 04/19/2011    6.1 (NOTE)                                                                       According to the ADA Clinical Practice Recommendations for 2011, when HbA1c is used as a screening test:   >=6.5%   Diagnostic of Diabetes Mellitus           (if abnormal result  is confirmed)  5.7-6.4%   Increased risk of developing Diabetes Mellitus  References:Diagnosis and Classification of Diabetes Mellitus,Diabetes KXFG,1829,93(ZJIRC 1):S62-S69 and Standards of Medical Care in         Diabetes - 2011,Diabetes Care,2011,34  (Suppl 1):S11-S61.   Lab Results  Component Value Date   VITAMINB12 640 01/18/2012   Lab Results  Component Value Date   TSH 1.928 01/17/2012      ASSESSMENT AND PLAN 84 y.o. year old female  has a past medical history of Arthritis, AVM (arteriovenous malformation), Lysbeth Galas lesion, acute, Cough, Diverticulosis, Diverticulosis, GERD (gastroesophageal reflux disease), H/O: GI bleed, H/O: hematuria, Hiatal hernia, History of colon polyps, History of hypokalemia, Hyperlipidemia, Hypertension, Internal hemorrhoid, Iron deficiency anemia, Osteoporosis, Paraesophageal hernia, Paraesophageal hernia, and Praesophageal hiatal hernia - giant, irreducible (10/20/2011). here with:  1.  Dementia  The patient's memory score has declined slightly since the last visit.  She will continue on Aricept and Namenda.  I  have advised that if her symptoms worsen or she develops new symptoms she should let us know.  She will follow-up in 6 months or sooner if needed.   I spent 15 minutes with the patient. 50% of this time was spent reviewing her plan of care   Ward Givens, MSN, NP-C 09/27/2018, 9:21 AM [facility] [address], [place], [place] [address] [phone]  I reviewed the above note and documentation by the Nurse Practitioner and agree  with the history, physical exam, assessment and plan as outlined above. I was immediately available for face-to-face consultation. Star Age, MD, PhD [facility])

## 2018-10-24 DIAGNOSIS — S300XXA Contusion of lower back and pelvis, initial encounter: Secondary | ICD-10-CM | POA: Diagnosis not present

## 2018-10-24 DIAGNOSIS — Z9181 History of falling: Secondary | ICD-10-CM | POA: Diagnosis not present

## 2018-10-30 DIAGNOSIS — K219 Gastro-esophageal reflux disease without esophagitis: Secondary | ICD-10-CM | POA: Diagnosis not present

## 2018-10-30 DIAGNOSIS — F329 Major depressive disorder, single episode, unspecified: Secondary | ICD-10-CM | POA: Diagnosis not present

## 2018-10-30 DIAGNOSIS — D509 Iron deficiency anemia, unspecified: Secondary | ICD-10-CM | POA: Diagnosis not present

## 2018-10-30 DIAGNOSIS — M810 Age-related osteoporosis without current pathological fracture: Secondary | ICD-10-CM | POA: Diagnosis not present

## 2018-10-30 DIAGNOSIS — I10 Essential (primary) hypertension: Secondary | ICD-10-CM | POA: Diagnosis not present

## 2018-10-30 DIAGNOSIS — K589 Irritable bowel syndrome without diarrhea: Secondary | ICD-10-CM | POA: Diagnosis not present

## 2018-10-30 DIAGNOSIS — E538 Deficiency of other specified B group vitamins: Secondary | ICD-10-CM | POA: Diagnosis not present

## 2018-10-30 DIAGNOSIS — Z9181 History of falling: Secondary | ICD-10-CM | POA: Diagnosis not present

## 2018-10-30 DIAGNOSIS — E782 Mixed hyperlipidemia: Secondary | ICD-10-CM | POA: Diagnosis not present

## 2018-10-30 DIAGNOSIS — E638 Other specified nutritional deficiencies: Secondary | ICD-10-CM | POA: Diagnosis not present

## 2018-11-02 DIAGNOSIS — E538 Deficiency of other specified B group vitamins: Secondary | ICD-10-CM | POA: Diagnosis not present

## 2018-11-02 DIAGNOSIS — M810 Age-related osteoporosis without current pathological fracture: Secondary | ICD-10-CM | POA: Diagnosis not present

## 2018-11-02 DIAGNOSIS — K589 Irritable bowel syndrome without diarrhea: Secondary | ICD-10-CM | POA: Diagnosis not present

## 2018-11-02 DIAGNOSIS — D509 Iron deficiency anemia, unspecified: Secondary | ICD-10-CM | POA: Diagnosis not present

## 2018-11-02 DIAGNOSIS — F329 Major depressive disorder, single episode, unspecified: Secondary | ICD-10-CM | POA: Diagnosis not present

## 2018-11-02 DIAGNOSIS — I10 Essential (primary) hypertension: Secondary | ICD-10-CM | POA: Diagnosis not present

## 2018-11-09 DIAGNOSIS — M810 Age-related osteoporosis without current pathological fracture: Secondary | ICD-10-CM | POA: Diagnosis not present

## 2018-11-09 DIAGNOSIS — I10 Essential (primary) hypertension: Secondary | ICD-10-CM | POA: Diagnosis not present

## 2018-11-09 DIAGNOSIS — D509 Iron deficiency anemia, unspecified: Secondary | ICD-10-CM | POA: Diagnosis not present

## 2018-11-09 DIAGNOSIS — E538 Deficiency of other specified B group vitamins: Secondary | ICD-10-CM | POA: Diagnosis not present

## 2018-11-09 DIAGNOSIS — F329 Major depressive disorder, single episode, unspecified: Secondary | ICD-10-CM | POA: Diagnosis not present

## 2018-11-09 DIAGNOSIS — K589 Irritable bowel syndrome without diarrhea: Secondary | ICD-10-CM | POA: Diagnosis not present

## 2018-11-10 DIAGNOSIS — I10 Essential (primary) hypertension: Secondary | ICD-10-CM | POA: Diagnosis not present

## 2018-11-10 DIAGNOSIS — K589 Irritable bowel syndrome without diarrhea: Secondary | ICD-10-CM | POA: Diagnosis not present

## 2018-11-10 DIAGNOSIS — F329 Major depressive disorder, single episode, unspecified: Secondary | ICD-10-CM | POA: Diagnosis not present

## 2018-11-10 DIAGNOSIS — E538 Deficiency of other specified B group vitamins: Secondary | ICD-10-CM | POA: Diagnosis not present

## 2018-11-10 DIAGNOSIS — D509 Iron deficiency anemia, unspecified: Secondary | ICD-10-CM | POA: Diagnosis not present

## 2018-11-10 DIAGNOSIS — M810 Age-related osteoporosis without current pathological fracture: Secondary | ICD-10-CM | POA: Diagnosis not present

## 2018-11-14 DIAGNOSIS — M810 Age-related osteoporosis without current pathological fracture: Secondary | ICD-10-CM | POA: Diagnosis not present

## 2018-11-14 DIAGNOSIS — E538 Deficiency of other specified B group vitamins: Secondary | ICD-10-CM | POA: Diagnosis not present

## 2018-11-14 DIAGNOSIS — I10 Essential (primary) hypertension: Secondary | ICD-10-CM | POA: Diagnosis not present

## 2018-11-14 DIAGNOSIS — K589 Irritable bowel syndrome without diarrhea: Secondary | ICD-10-CM | POA: Diagnosis not present

## 2018-11-14 DIAGNOSIS — D509 Iron deficiency anemia, unspecified: Secondary | ICD-10-CM | POA: Diagnosis not present

## 2018-11-14 DIAGNOSIS — F329 Major depressive disorder, single episode, unspecified: Secondary | ICD-10-CM | POA: Diagnosis not present

## 2018-11-17 DIAGNOSIS — F329 Major depressive disorder, single episode, unspecified: Secondary | ICD-10-CM | POA: Diagnosis not present

## 2018-11-17 DIAGNOSIS — I10 Essential (primary) hypertension: Secondary | ICD-10-CM | POA: Diagnosis not present

## 2018-11-17 DIAGNOSIS — K589 Irritable bowel syndrome without diarrhea: Secondary | ICD-10-CM | POA: Diagnosis not present

## 2018-11-17 DIAGNOSIS — D509 Iron deficiency anemia, unspecified: Secondary | ICD-10-CM | POA: Diagnosis not present

## 2018-11-17 DIAGNOSIS — M810 Age-related osteoporosis without current pathological fracture: Secondary | ICD-10-CM | POA: Diagnosis not present

## 2018-11-17 DIAGNOSIS — E538 Deficiency of other specified B group vitamins: Secondary | ICD-10-CM | POA: Diagnosis not present

## 2019-01-01 DIAGNOSIS — R809 Proteinuria, unspecified: Secondary | ICD-10-CM | POA: Diagnosis not present

## 2019-01-01 DIAGNOSIS — K589 Irritable bowel syndrome without diarrhea: Secondary | ICD-10-CM | POA: Diagnosis not present

## 2019-01-01 DIAGNOSIS — G4700 Insomnia, unspecified: Secondary | ICD-10-CM | POA: Diagnosis not present

## 2019-01-01 DIAGNOSIS — K219 Gastro-esophageal reflux disease without esophagitis: Secondary | ICD-10-CM | POA: Diagnosis not present

## 2019-01-01 DIAGNOSIS — Z0000 Encounter for general adult medical examination without abnormal findings: Secondary | ICD-10-CM | POA: Diagnosis not present

## 2019-01-01 DIAGNOSIS — D509 Iron deficiency anemia, unspecified: Secondary | ICD-10-CM | POA: Diagnosis not present

## 2019-01-01 DIAGNOSIS — J301 Allergic rhinitis due to pollen: Secondary | ICD-10-CM | POA: Diagnosis not present

## 2019-01-01 DIAGNOSIS — I10 Essential (primary) hypertension: Secondary | ICD-10-CM | POA: Diagnosis not present

## 2019-01-01 DIAGNOSIS — M810 Age-related osteoporosis without current pathological fracture: Secondary | ICD-10-CM | POA: Diagnosis not present

## 2019-01-01 DIAGNOSIS — E782 Mixed hyperlipidemia: Secondary | ICD-10-CM | POA: Diagnosis not present

## 2019-01-01 DIAGNOSIS — R413 Other amnesia: Secondary | ICD-10-CM | POA: Diagnosis not present

## 2019-01-04 ENCOUNTER — Emergency Department (HOSPITAL_COMMUNITY): Payer: Medicare Other

## 2019-01-04 ENCOUNTER — Emergency Department (HOSPITAL_COMMUNITY)
Admission: EM | Admit: 2019-01-04 | Discharge: 2019-01-04 | Disposition: A | Discharge status: Home / Self Care | Payer: Medicare Other | Attending: Emergency Medicine | Admitting: Emergency Medicine

## 2019-01-04 ENCOUNTER — Other Ambulatory Visit: Payer: Self-pay

## 2019-01-04 ENCOUNTER — Encounter (HOSPITAL_COMMUNITY): Payer: Self-pay | Admitting: Emergency Medicine

## 2019-01-04 DIAGNOSIS — K5730 Diverticulosis of large intestine without perforation or abscess without bleeding: Secondary | ICD-10-CM | POA: Diagnosis not present

## 2019-01-04 DIAGNOSIS — K5900 Constipation, unspecified: Secondary | ICD-10-CM | POA: Insufficient documentation

## 2019-01-04 DIAGNOSIS — I110 Hypertensive heart disease with heart failure: Secondary | ICD-10-CM | POA: Insufficient documentation

## 2019-01-04 DIAGNOSIS — R141 Gas pain: Secondary | ICD-10-CM | POA: Insufficient documentation

## 2019-01-04 DIAGNOSIS — I509 Heart failure, unspecified: Secondary | ICD-10-CM | POA: Diagnosis not present

## 2019-01-04 DIAGNOSIS — R1030 Lower abdominal pain, unspecified: Secondary | ICD-10-CM | POA: Diagnosis not present

## 2019-01-04 DIAGNOSIS — R10817 Generalized abdominal tenderness: Secondary | ICD-10-CM | POA: Diagnosis not present

## 2019-01-04 DIAGNOSIS — I454 Nonspecific intraventricular block: Secondary | ICD-10-CM | POA: Diagnosis not present

## 2019-01-04 DIAGNOSIS — Z79899 Other long term (current) drug therapy: Secondary | ICD-10-CM | POA: Insufficient documentation

## 2019-01-04 DIAGNOSIS — R1084 Generalized abdominal pain: Secondary | ICD-10-CM | POA: Diagnosis not present

## 2019-01-04 DIAGNOSIS — I4510 Unspecified right bundle-branch block: Secondary | ICD-10-CM | POA: Diagnosis not present

## 2019-01-04 LAB — CBC WITH DIFFERENTIAL/PLATELET
ABS IMMATURE GRANULOCYTES: 0.07 10*3/uL (ref 0.00–0.07)
Basophils Absolute: 0.1 10*3/uL (ref 0.0–0.1)
Basophils Relative: 1 %
EOS PCT: 1 %
Eosinophils Absolute: 0.1 10*3/uL (ref 0.0–0.5)
HEMATOCRIT: 43.3 % (ref 36.0–46.0)
HEMOGLOBIN: 14.3 g/dL (ref 12.0–15.0)
Immature Granulocytes: 1 %
LYMPHS ABS: 1.6 10*3/uL (ref 0.7–4.0)
LYMPHS PCT: 15 %
MCH: 31.7 pg (ref 26.0–34.0)
MCHC: 33 g/dL (ref 30.0–36.0)
MCV: 96 fL (ref 80.0–100.0)
Monocytes Absolute: 0.7 10*3/uL (ref 0.1–1.0)
Monocytes Relative: 7 %
NEUTROS ABS: 8.3 10*3/uL — AB (ref 1.7–7.7)
NRBC: 0 % (ref 0.0–0.2)
Neutrophils Relative %: 75 %
Platelets: 196 10*3/uL (ref 150–400)
RBC: 4.51 MIL/uL (ref 3.87–5.11)
RDW: 12.3 % (ref 11.5–15.5)
WBC: 10.9 10*3/uL — ABNORMAL HIGH (ref 4.0–10.5)

## 2019-01-04 LAB — URINALYSIS, ROUTINE W REFLEX MICROSCOPIC
BACTERIA UA: NONE SEEN
BILIRUBIN URINE: NEGATIVE
Glucose, UA: NEGATIVE mg/dL
KETONES UR: NEGATIVE mg/dL
LEUKOCYTES UA: NEGATIVE
NITRITE: NEGATIVE
PROTEIN: NEGATIVE mg/dL
Specific Gravity, Urine: 1.017 (ref 1.005–1.030)
pH: 6 (ref 5.0–8.0)

## 2019-01-04 LAB — I-STAT CHEM 8, ED
BUN: 15 mg/dL (ref 8–23)
CHLORIDE: 105 mmol/L (ref 98–111)
CREATININE: 0.7 mg/dL (ref 0.44–1.00)
Calcium, Ion: 1.1 mmol/L — ABNORMAL LOW (ref 1.15–1.40)
Glucose, Bld: 138 mg/dL — ABNORMAL HIGH (ref 70–99)
HEMATOCRIT: 42 % (ref 36.0–46.0)
Hemoglobin: 14.3 g/dL (ref 12.0–15.0)
Potassium: 3.7 mmol/L (ref 3.5–5.1)
SODIUM: 141 mmol/L (ref 135–145)
TCO2: 25 mmol/L (ref 22–32)

## 2019-01-04 MED ORDER — ONDANSETRON HCL 4 MG/2ML IJ SOLN
4.0000 mg | Freq: Once | INTRAMUSCULAR | Status: AC
Start: 2019-01-04 — End: 2019-01-04
  Administered 2019-01-04: 4 mg via INTRAVENOUS
  Filled 2019-01-04: qty 2

## 2019-01-04 MED ORDER — ALUM & MAG HYDROXIDE-SIMETH 200-200-20 MG/5ML PO SUSP
30.0000 mL | Freq: Once | ORAL | Status: AC
  Administered 2019-01-04: 30 mL via ORAL
  Filled 2019-01-04: qty 30

## 2019-01-04 NOTE — ED Notes (Signed)
Bed: WA09 Expected date:  Expected time:  Means of arrival:  Comments: 85 yr old abd pain

## 2019-01-04 NOTE — ED Triage Notes (Signed)
Pt from home via EMS with c/o lower abdominal pain with nausea, no vomiting or diarrhea.

## 2019-01-04 NOTE — ED Provider Notes (Signed)
[facility] DEPT Provider Note   CSN: [id number] Arrival date & time: 01/04/19  0149     History   Chief Complaint Chief Complaint  Patient presents with  . Abdominal Pain    HPI Brooke [name] is a 85 y.o. female.  The history is provided by a relative (level 5 for dementia). The history is limited by the condition of the patient.  Abdominal Pain  Pain location:  Generalized Pain quality: cramping   Pain radiates to:  Does not radiate Pain severity:  Severe Onset quality:  Sudden Duration:  2 hours Timing:  Constant Progression:  Unchanged Chronicity:  New Context: eating   Context comment:  Coleslaw, chicken gravy and creamed potatoes Relieved by:  Nothing Worsened by:  Nothing Ineffective treatments:  None tried Associated symptoms: nausea   Associated symptoms: no anorexia, no chest pain, no diarrhea, no fever, no hematuria, no shortness of breath and no vomiting   Risk factors: being elderly     Past Medical History:  Diagnosis Date  . Arthritis    hands   . AVM (arteriovenous malformation)   . Cameron lesion, acute   . Cough   . Diverticulosis   . Diverticulosis   . GERD (gastroesophageal reflux disease)   . H/O: GI bleed   . H/O: hematuria   . Hiatal hernia   . History of colon polyps   . History of hypokalemia   . Hyperlipidemia   . Hypertension   . Internal hemorrhoid   . Iron deficiency anemia   . Osteoporosis   . Paraesophageal hernia   . Paraesophageal hernia    history of  . Praesophageal hiatal hernia - giant, irreducible 10/20/2011    Patient Active Problem List   Diagnosis Date Noted  . Mild cognitive impairment 08/22/2015  . Hypokalemia 01/18/2012  . Confusion 01/18/2012  . Acute respiratory failure ([place]) 01/17/2012  . CHF (congestive heart failure) ([place]) 01/17/2012  . Pleural effusion, bilateral 01/17/2012  . CONSTIPATION 09/23/2010  . DIVERTICULOSIS, COLON 05/30/2009  . COLONIC POLYPS,  HYPERPLASTIC, HX OF 05/30/2009  . ANEMIA, IRON DEFICIENCY 12/27/2007  . Essential hypertension 12/27/2007  . HEMORRHOIDS, INTERNAL 12/27/2007  . GERD 12/27/2007  . OSTEOPOROSIS 12/27/2007  . ARTERIOVENOUS MALFORMATION 12/27/2007    Past Surgical History:  Procedure Laterality Date  . CESAREAN SECTION  01/1965  . colonscopy    . DILATION AND CURETTAGE OF UTERUS    . HERNIA REPAIR  12/2011  . LAPAROSCOPIC NISSEN FUNDOPLICATION  0/07/6760   Procedure: LAPAROSCOPIC NISSEN FUNDOPLICATION;  Surgeon: Adin Hector, MD;  Location: [facility];  Service: General;  Laterality: N/A;  laparoscopic paraesphogeal hernia repair and fundoplication  . POLYPECTOMY       OB History   No obstetric history on file.      Home Medications    Prior to Admission medications   Medication Sig Start Date End Date Taking? Authorizing Provider  donepezil (ARICEPT) 10 MG tablet TAKE 1 TABLET BY MOUTH EVERYDAY AT BEDTIME 09/04/18   Star Age, MD  memantine (NAMENDA) 10 MG tablet TAKE 1 TABLET BY MOUTH TWICE A DAY 09/20/18   Star Age, MD  Methylfol-Algae-B12-Acetylcyst (CEREFOLIN NAC) 6-90.314-2-600 MG TABS Take 1 tablet by mouth daily. 01/26/18   [provider]  Methylfol-Algae-B12-Acetylcyst (CEREFOLIN NAC) 6-90.314-2-600 MG TABS TAKE 1 TABLET BY MOUTH EVERY DAY 09/11/18   Star Age, MD  metoprolol tartrate (LOPRESSOR) 25 MG tablet 25 mg daily. 03/14/18   [provider]  perphenazine-amitriptyline (ETRAFON/TRIAVIL)  2-10 MG TABS Take 2 tablets by mouth at bedtime.     [provider]  simvastatin (ZOCOR) 20 MG tablet Take 20 mg by mouth at bedtime.     [provider]    Family History Family History  Problem Relation Age of Onset  . Prostate cancer Brother   . Cancer Brother        prostate, and skin  . Colon cancer Neg Hx     Social History Social History   Tobacco Use  . Smoking status: Never Smoker  . Smokeless tobacco: Never Used  Substance Use Topics    . Alcohol use: Yes    Alcohol/week: 18.0 standard drinks    Types: 18 Glasses of wine per week    Comment: 1 glasses daily  . Drug use: No     Allergies   Oxycontin [oxycodone hcl]   Review of Systems Review of Systems  Unable to perform ROS: Dementia  Constitutional: Negative for fever.  Respiratory: Negative for shortness of breath.   Cardiovascular: Negative for chest pain.  Gastrointestinal: Positive for abdominal pain and nausea. Negative for anorexia, diarrhea and vomiting.  Genitourinary: Negative for hematuria.     Physical Exam Updated Vital Signs BP (!) 174/70   Pulse 65   Temp (!) 97.5 F (36.4 C) (Oral)   Resp 18   SpO2 95%   Physical Exam Constitutional:      General: She is not in acute distress.    Appearance: She is well-developed. She is not ill-appearing.  HENT:     Head: Normocephalic and atraumatic.     Mouth/Throat:     Mouth: Mucous membranes are moist.  Cardiovascular:     Rate and Rhythm: Normal rate and regular rhythm.  Pulmonary:     Effort: Pulmonary effort is normal. No respiratory distress.     Breath sounds: Normal breath sounds.  Abdominal:     General: Abdomen is flat. Bowel sounds are increased. There is no distension.     Palpations: Abdomen is soft. There is no shifting dullness, hepatomegaly or mass.     Tenderness: There is no abdominal tenderness.     Hernia: No hernia is present.     Comments: Gassy throughout  Musculoskeletal: Normal range of motion.  Skin:    General: Skin is warm and dry.     Capillary Refill: Capillary refill takes less than 2 seconds.  Neurological:     General: No focal deficit present.     Mental Status: She is alert and oriented to person, place, and time.  Psychiatric:        Mood and Affect: Mood normal.        Behavior: Behavior normal.      ED Treatments / Results  Labs (all labs ordered are listed, but only abnormal results are displayed) Results for orders placed or performed  during the hospital encounter of 01/04/19  CBC with Differential/Platelet  Result Value Ref Range   WBC 10.9 (H) 4.0 - 10.5 K/uL   RBC 4.51 3.87 - 5.11 MIL/uL   Hemoglobin 14.3 12.0 - 15.0 g/dL   HCT 43.3 36.0 - 46.0 %   MCV 96.0 80.0 - 100.0 fL   MCH 31.7 26.0 - 34.0 pg   MCHC 33.0 30.0 - 36.0 g/dL   RDW 12.3 11.5 - 15.5 %   Platelets 196 150 - 400 K/uL   nRBC 0.0 0.0 - 0.2 %   Neutrophils Relative % 75 %   Neutro  Abs 8.3 (H) 1.7 - 7.7 K/uL   Lymphocytes Relative 15 %   Lymphs Abs 1.6 0.7 - 4.0 K/uL   Monocytes Relative 7 %   Monocytes Absolute 0.7 0.1 - 1.0 K/uL   Eosinophils Relative 1 %   Eosinophils Absolute 0.1 0.0 - 0.5 K/uL   Basophils Relative 1 %   Basophils Absolute 0.1 0.0 - 0.1 K/uL   Immature Granulocytes 1 %   Abs Immature Granulocytes 0.07 0.00 - 0.07 K/uL  I-Stat Chem 8, ED  Result Value Ref Range   Sodium 141 135 - 145 mmol/L   Potassium 3.7 3.5 - 5.1 mmol/L   Chloride 105 98 - 111 mmol/L   BUN 15 8 - 23 mg/dL   Creatinine, Ser 0.70 0.44 - 1.00 mg/dL   Glucose, Bld 138 (H) 70 - 99 mg/dL   Calcium, Ion 1.10 (L) 1.15 - 1.40 mmol/L   TCO2 25 22 - 32 mmol/L   Hemoglobin 14.3 12.0 - 15.0 g/dL   HCT 42.0 36.0 - 46.0 %   Ct Renal Stone Study  Result Date: 01/04/2019 CLINICAL DATA:  Lower abdominal/groin pain. Nausea. EXAM: CT ABDOMEN AND PELVIS WITHOUT CONTRAST TECHNIQUE: Multidetector CT imaging of the abdomen and pelvis was performed following the standard protocol without IV contrast. COMPARISON:  CT 04/19/2011 FINDINGS: Lower chest: Mild bibasilar scarring or atelectasis. No pleural fluid or confluent airspace disease. Small pericardial cyst abutting the right heart border is unchanged. Hepatobiliary: No focal hepatic abnormality on noncontrast exam. Gallbladder is distended without calcified gallstone or pericholecystic inflammation. No biliary dilatation. Pancreas: No ductal dilatation or inflammation. Spleen: Calcified granuloma. Normal in size.  Adrenals/Urinary Tract: Normal adrenal glands. Parapelvic cyst in the right kidney without hydronephrosis. Additional cortical cyst in the lower right kidney. No left hydronephrosis. No perinephric edema. Urinary bladder is nondistended. Stomach/Bowel: Repair of paraesophageal and hiatal hernia with Nissen fundoplication. Stomach is partially distended. No gastric wall thickening. No small bowel dilatation, inflammation, or obstruction. Normal appendix. Barium filled diverticulum at the hepatic flexure of the colon. Additional scattered colonic diverticula. No diverticulitis. Moderate stool in the left colon. Stool distends the rectum, rectal dimension of 6.1 cm. Vascular/Lymphatic: Aortic atherosclerosis without aneurysm. No enlarged lymph nodes in the abdomen or pelvis. Reproductive: Uterus and bilateral adnexa are unremarkable. Other: No inguinal hernia. No free air or free fluid. Musculoskeletal: Degenerative change throughout the spine. There are no acute or suspicious osseous abnormalities. Soft tissues are unremarkable. IMPRESSION: 1. No acute abnormality in the abdomen/pelvis or explanation for groin pain. 2. Colonic diverticulosis without diverticulitis. 3. Moderate stool in the left colon with mild rectal distension, recommend correlation for constipation. 4. Gallbladder distention without inflammatory change or visualized gallstone. 5.  Aortic Atherosclerosis (ICD10-I70.0). Electronically Signed   By: Keith Rake M.D.   On: 01/04/2019 02:51    Procedures Procedures (including critical care time)  Medications Ordered in ED Medications  ondansetron (ZOFRAN) injection 4 mg (4 mg Intravenous Given 01/04/19 0309)  alum & mag hydroxide-simeth (MAALOX/MYLANTA) 200-200-20 MG/5ML suspension 30 mL (30 mLs Oral Given 01/04/19 0309)      Final Clinical Impressions(s) / ED Diagnoses  Exam is benign and reassuring symptoms consistent with gas and constipation.     Return for pain, intractable cough,  productive cough, fevers >100.4 unrelieved by medication, shortness of breath, intractable vomiting, or diarrhea, abdominal pain, Inability to tolerate liquids or food, cough, altered mental status or any concerns. No signs of systemic illness or infection. The patient is nontoxic-appearing on exam  and vital signs are within normal limits.   I have reviewed the triage vital signs and the nursing notes. Pertinent labs &imaging results that were available during my care of the patient were reviewed by me and considered in my medical decision making (see chart for details).  After history, exam, and medical workup I feel the patient has been appropriately medically screened and is safe for discharge home. Pertinent diagnoses were discussed with the patient. Patient was given return precautions.   Kamile Fassler, MD 01/04/19 [phone]

## 2019-01-15 ENCOUNTER — Other Ambulatory Visit: Payer: Self-pay | Admitting: Neurology

## 2019-02-02 ENCOUNTER — Encounter (HOSPITAL_COMMUNITY): Payer: Self-pay | Admitting: Emergency Medicine

## 2019-02-02 ENCOUNTER — Emergency Department (HOSPITAL_COMMUNITY): Payer: Medicare Other

## 2019-02-02 ENCOUNTER — Emergency Department (HOSPITAL_COMMUNITY)
Admission: EM | Admit: 2019-02-02 | Discharge: 2019-02-02 | Disposition: A | Discharge status: Home / Self Care | Payer: Medicare Other | Attending: Emergency Medicine | Admitting: Emergency Medicine

## 2019-02-02 DIAGNOSIS — Z23 Encounter for immunization: Secondary | ICD-10-CM | POA: Diagnosis not present

## 2019-02-02 DIAGNOSIS — Y92009 Unspecified place in unspecified non-institutional (private) residence as the place of occurrence of the external cause: Secondary | ICD-10-CM

## 2019-02-02 DIAGNOSIS — S20211A Contusion of right front wall of thorax, initial encounter: Secondary | ICD-10-CM | POA: Diagnosis not present

## 2019-02-02 DIAGNOSIS — I110 Hypertensive heart disease with heart failure: Secondary | ICD-10-CM | POA: Diagnosis not present

## 2019-02-02 DIAGNOSIS — Y9389 Activity, other specified: Secondary | ICD-10-CM | POA: Insufficient documentation

## 2019-02-02 DIAGNOSIS — W07XXXA Fall from chair, initial encounter: Secondary | ICD-10-CM | POA: Diagnosis not present

## 2019-02-02 DIAGNOSIS — Z79899 Other long term (current) drug therapy: Secondary | ICD-10-CM | POA: Diagnosis not present

## 2019-02-02 DIAGNOSIS — R0781 Pleurodynia: Secondary | ICD-10-CM | POA: Diagnosis not present

## 2019-02-02 DIAGNOSIS — S299XXA Unspecified injury of thorax, initial encounter: Secondary | ICD-10-CM | POA: Diagnosis not present

## 2019-02-02 DIAGNOSIS — W19XXXA Unspecified fall, initial encounter: Secondary | ICD-10-CM | POA: Diagnosis not present

## 2019-02-02 DIAGNOSIS — Y92008 Other place in unspecified non-institutional (private) residence as the place of occurrence of the external cause: Secondary | ICD-10-CM | POA: Insufficient documentation

## 2019-02-02 DIAGNOSIS — R52 Pain, unspecified: Secondary | ICD-10-CM | POA: Diagnosis not present

## 2019-02-02 DIAGNOSIS — I509 Heart failure, unspecified: Secondary | ICD-10-CM | POA: Insufficient documentation

## 2019-02-02 DIAGNOSIS — F0390 Unspecified dementia without behavioral disturbance: Secondary | ICD-10-CM | POA: Diagnosis not present

## 2019-02-02 DIAGNOSIS — Y999 Unspecified external cause status: Secondary | ICD-10-CM | POA: Insufficient documentation

## 2019-02-02 DIAGNOSIS — R0902 Hypoxemia: Secondary | ICD-10-CM | POA: Diagnosis not present

## 2019-02-02 DIAGNOSIS — I10 Essential (primary) hypertension: Secondary | ICD-10-CM | POA: Diagnosis not present

## 2019-02-02 MED ORDER — FENTANYL CITRATE (PF) 100 MCG/2ML IJ SOLN
25.0000 ug | Freq: Once | INTRAMUSCULAR | Status: AC
  Administered 2019-02-02: 25 ug via INTRAMUSCULAR
  Filled 2019-02-02: qty 2

## 2019-02-02 MED ORDER — TETANUS-DIPHTH-ACELL PERTUSSIS 5-2.5-18.5 LF-MCG/0.5 IM SUSP
0.5000 mL | Freq: Once | INTRAMUSCULAR | Status: AC
  Administered 2019-02-02: 0.5 mL via INTRAMUSCULAR
  Filled 2019-02-02: qty 0.5

## 2019-02-02 NOTE — ED Provider Notes (Signed)
  Face-to-face evaluation   History: She presents for evaluation of right chest wall injury.  She apparently stood up and fell.  This is not uncommon for her.  Here with family members who give most of the history.  Physical exam: Alert, calm, cooperative.  Chest moderate lateral tenderness without instability.  No right upper quadrant abdominal tenderness or swelling.  Superficial abrasion right lateral chest wall, with bruising, but no bleeding.  Medical screening examination/treatment/procedure(s) were conducted as a shared visit with non-physician practitioner(s) and myself.  I personally evaluated the patient during the encounter    Daleen Bo, MD 02/03/19 1423

## 2019-02-02 NOTE — ED Notes (Signed)
Pt ambulated in hallway with minimal assist, pt a little unsteady while ambulating

## 2019-02-02 NOTE — ED Notes (Signed)
Bed: WA04 Expected date:  Expected time:  Means of arrival:  Comments: EMS-weak

## 2019-02-02 NOTE — ED Provider Notes (Addendum)
[facility] DEPT Provider Note   CSN: [id number] Arrival date & time: 02/02/19  1821     History   Chief Complaint Chief Complaint  Patient presents with  . Fall    HPI Brooke [name] is a 85 y.o. female with past medical history of dementia, hypertension, hyperlipidemia, GERD,, CHF, osteoporosis, presenting via EMS after mechanical fall that occurred a few hours ago today.  Fall was unwitnessed, however family at patient side immediately after fall.  Patient states she was getting up from a stool and slipped, falling down on the floor.  She hit her right side on the floor and has had pain to the area since.  Pain is worse with movement and sitting down.  No pain with breathing or shortness of breath.  She did not hit her head or pass out.  No other injuries reported.  Denies Neck or back pain.  Treated at home with ibuprofen, however did not provide much relief and therefore family called EMS for evaluation.  Patient has dementia, however is reportedly at her baseline. The history is provided by the patient and the EMS personnel.    Past Medical History:  Diagnosis Date  . Arthritis    hands   . AVM (arteriovenous malformation)   . Cameron lesion, acute   . Cough   . Diverticulosis   . Diverticulosis   . GERD (gastroesophageal reflux disease)   . H/O: GI bleed   . H/O: hematuria   . Hiatal hernia   . History of colon polyps   . History of hypokalemia   . Hyperlipidemia   . Hypertension   . Internal hemorrhoid   . Iron deficiency anemia   . Osteoporosis   . Paraesophageal hernia   . Paraesophageal hernia    history of  . Praesophageal hiatal hernia - giant, irreducible 10/20/2011    Patient Active Problem List   Diagnosis Date Noted  . Mild cognitive impairment 08/22/2015  . Hypokalemia 01/18/2012  . Confusion 01/18/2012  . Acute respiratory failure ([place]) 01/17/2012  . CHF (congestive heart failure) ([place]) 01/17/2012  . Pleural  effusion, bilateral 01/17/2012  . CONSTIPATION 09/23/2010  . DIVERTICULOSIS, COLON 05/30/2009  . COLONIC POLYPS, HYPERPLASTIC, HX OF 05/30/2009  . ANEMIA, IRON DEFICIENCY 12/27/2007  . Essential hypertension 12/27/2007  . HEMORRHOIDS, INTERNAL 12/27/2007  . GERD 12/27/2007  . OSTEOPOROSIS 12/27/2007  . ARTERIOVENOUS MALFORMATION 12/27/2007    Past Surgical History:  Procedure Laterality Date  . CESAREAN SECTION  01/1965  . colonscopy    . DILATION AND CURETTAGE OF UTERUS    . HERNIA REPAIR  12/2011  . LAPAROSCOPIC NISSEN FUNDOPLICATION  6/96/7893   Procedure: LAPAROSCOPIC NISSEN FUNDOPLICATION;  Surgeon: Adin Hector, MD;  Location: [facility];  Service: General;  Laterality: N/A;  laparoscopic paraesphogeal hernia repair and fundoplication  . POLYPECTOMY       OB History   No obstetric history on file.      Home Medications    Prior to Admission medications   Medication Sig Start Date End Date Taking? Authorizing Provider  denosumab (PROLIA) 60 MG/ML SOSY injection Inject 60 mg into the skin every 6 (six) months.   Yes [provider]  donepezil (ARICEPT) 10 MG tablet TAKE 1 TABLET BY MOUTH EVERYDAY AT BEDTIME Patient taking differently: Take 10 mg by mouth at bedtime.  09/04/18  Yes Star Age, MD  memantine (NAMENDA) 10 MG tablet TAKE 1 TABLET BY MOUTH TWICE A DAY 09/20/18  Yes  Star Age, MD  Methylfol-Algae-B12-Acetylcyst (CEREFOLIN NAC) 6-90.314-2-600 MG TABS TAKE 1 TABLET BY MOUTH EVERY DAY 01/15/19  Yes Star Age, MD  metoprolol tartrate (LOPRESSOR) 25 MG tablet Take 25 mg by mouth 2 (two) times daily.  03/14/18  Yes [provider]  perphenazine-amitriptyline (ETRAFON/TRIAVIL) 2-10 MG TABS tablet Take 1 tablet by mouth 2 (two) times daily.   Yes [provider]  simvastatin (ZOCOR) 20 MG tablet Take 20 mg by mouth at bedtime.    Yes [provider]  triamcinolone cream (KENALOG) 0.5 % Apply 1 application topically 2 (two) times  daily.   Yes [provider]    Family History Family History  Problem Relation Age of Onset  . Prostate cancer Brother   . Cancer Brother        prostate, and skin  . Colon cancer Neg Hx     Social History Social History   Tobacco Use  . Smoking status: Never Smoker  . Smokeless tobacco: Never Used  Substance Use Topics  . Alcohol use: Yes    Alcohol/week: 18.0 standard drinks    Types: 18 Glasses of wine per week    Comment: 1 glasses daily  . Drug use: No     Allergies   Oxycontin [oxycodone hcl]   Review of Systems Review of Systems  All other systems reviewed and are negative.    Physical Exam Updated Vital Signs BP (!) 170/70   Pulse 62   Temp 97.7 F (36.5 C) (Oral)   Resp 18   SpO2 100%   Physical Exam Vitals signs and nursing note reviewed.  Constitutional:      General: She is not in acute distress.    Appearance: She is well-developed.  HENT:     Head: Normocephalic and atraumatic.  Eyes:     Extraocular Movements: Extraocular movements intact.     Conjunctiva/sclera: Conjunctivae normal.     Pupils: Pupils are equal, round, and reactive to light.  Cardiovascular:     Rate and Rhythm: Normal rate and regular rhythm.  Pulmonary:     Effort: Pulmonary effort is normal.     Breath sounds: Normal breath sounds.  Abdominal:     General: Bowel sounds are normal. There is no distension.     Palpations: Abdomen is soft.     Tenderness: There is no abdominal tenderness. There is no guarding or rebound.     Comments: Superficial abrasion/skin tear to right flank. Assoc tenderness to lower posterior/laterial ribs. No crepitus or obviously deformity.   Skin:    General: Skin is warm.  Neurological:     Mental Status: She is alert.     Comments: Pt oriented to person place, season. Does not know the year. Has trouble word finding, however answers questions appropriately. No obvious CN deficits. Steady gait.   Psychiatric:        Mood and  Affect: Mood normal.        Behavior: Behavior normal.      ED Treatments / Results  Labs (all labs ordered are listed, but only abnormal results are displayed) Labs Reviewed - No data to display  EKG None  Radiology Dg Ribs Unilateral W/chest Right  Result Date: 02/02/2019 CLINICAL DATA:  Patient status post fall EXAM: RIGHT RIBS AND CHEST - 3+ VIEW COMPARISON:  Chest radiograph 01/18/2012 FINDINGS: Normal cardiac and mediastinal contours. Tortuosity of the thoracic aorta. No consolidative pulmonary opacities. No pleural effusion or pneumothorax. No evidence for acute displaced right  lower rib fracture. IMPRESSION: No acute cardiopulmonary process. Electronically Signed   By: Lovey Newcomer M.D.   On: 02/02/2019 19:50    Procedures Procedures (including critical care time)  Medications Ordered in ED Medications  Tdap (BOOSTRIX) injection 0.5 mL (0.5 mLs Intramuscular Given 02/02/19 2020)  fentaNYL (SUBLIMAZE) injection 25 mcg (25 mcg Intramuscular Given 02/02/19 2021)     Initial Impression / Assessment and Plan / ED Course  I have reviewed the triage vital signs and the nursing notes.  Pertinent labs & imaging results that were available during my care of the patient were reviewed by me and considered in my medical decision making (see chart for details).  Patient with history of dementia, presenting via EMS after mechanical fall that occurred earlier today.  At baseline per family.  Right posterior/lateral chest wall/flank contusion.  No head trauma or LOC.  Not on anticoagulation.  Lungs are clear.  No apparent distress.  Will treat pain and order x-ray. tdap updated.  Clinical Course as of Feb 02 2042  Ludwig Clarks Feb 02, 2019  2038 Discussed reassuring x-ray results with patient.  Patient reports improvement in is ready for discharge.  Discussed symptomatic management and PCP follow-up.   [JR]    Clinical Course User Index [JR] Dyllan Kats, [place] N, PA-C    Patient discussed with and  seen by Dr. Eulis Foster.  Discussed results, findings, treatment and follow up. Patient advised of return precautions. Patient verbalized understanding and agreed with plan.   Final Clinical Impressions(s) / ED Diagnoses   Final diagnoses:  Contusion of right chest wall, initial encounter  Fall in home, initial encounter    ED Discharge Orders    None       Asher Torpey, [place] N, PA-C 02/02/19 2042    Jesua Tamblyn, [place] N, [place] 02/02/19 2043    Daleen Bo, MD 02/03/19 [phone]

## 2019-02-02 NOTE — ED Triage Notes (Signed)
Pt BIB [facility] from home. Pt suffered an unwitnessed fall. Family was home and heard the fall and found pt on the floor in the bathroom. Denies Head or back injury. She does have a superficial abrasion to the right lower rib area on her back. Pt was ambulatory since fall. CAox4. Took tylenol and pain was not any better and came here for an x-ray. Pt has steady gate. No blood thinners.

## 2019-02-02 NOTE — Discharge Instructions (Signed)
You can take Tylenol every 4-6 hours as needed for pain.  Apply ice for 20 minutes at a time.  You can apply heat if this provides more relief.  Follow-up with your primary care provider.  Return if you develop difficulty breathing or new or concerning symptoms.

## 2019-02-08 DIAGNOSIS — W19XXXA Unspecified fall, initial encounter: Secondary | ICD-10-CM | POA: Diagnosis not present

## 2019-02-08 DIAGNOSIS — R296 Repeated falls: Secondary | ICD-10-CM | POA: Diagnosis not present

## 2019-02-08 DIAGNOSIS — S301XXA Contusion of abdominal wall, initial encounter: Secondary | ICD-10-CM | POA: Diagnosis not present

## 2019-02-19 DIAGNOSIS — Z9181 History of falling: Secondary | ICD-10-CM | POA: Diagnosis not present

## 2019-02-19 DIAGNOSIS — K589 Irritable bowel syndrome without diarrhea: Secondary | ICD-10-CM | POA: Diagnosis not present

## 2019-02-19 DIAGNOSIS — G4700 Insomnia, unspecified: Secondary | ICD-10-CM | POA: Diagnosis not present

## 2019-02-19 DIAGNOSIS — M810 Age-related osteoporosis without current pathological fracture: Secondary | ICD-10-CM | POA: Diagnosis not present

## 2019-02-19 DIAGNOSIS — G3184 Mild cognitive impairment, so stated: Secondary | ICD-10-CM | POA: Diagnosis not present

## 2019-02-19 DIAGNOSIS — F17210 Nicotine dependence, cigarettes, uncomplicated: Secondary | ICD-10-CM | POA: Diagnosis not present

## 2019-02-19 DIAGNOSIS — S301XXD Contusion of abdominal wall, subsequent encounter: Secondary | ICD-10-CM | POA: Diagnosis not present

## 2019-02-19 DIAGNOSIS — E782 Mixed hyperlipidemia: Secondary | ICD-10-CM | POA: Diagnosis not present

## 2019-02-19 DIAGNOSIS — F329 Major depressive disorder, single episode, unspecified: Secondary | ICD-10-CM | POA: Diagnosis not present

## 2019-02-19 DIAGNOSIS — I10 Essential (primary) hypertension: Secondary | ICD-10-CM | POA: Diagnosis not present

## 2019-02-21 DIAGNOSIS — F329 Major depressive disorder, single episode, unspecified: Secondary | ICD-10-CM | POA: Diagnosis not present

## 2019-02-21 DIAGNOSIS — S301XXD Contusion of abdominal wall, subsequent encounter: Secondary | ICD-10-CM | POA: Diagnosis not present

## 2019-02-21 DIAGNOSIS — E782 Mixed hyperlipidemia: Secondary | ICD-10-CM | POA: Diagnosis not present

## 2019-02-21 DIAGNOSIS — K589 Irritable bowel syndrome without diarrhea: Secondary | ICD-10-CM | POA: Diagnosis not present

## 2019-02-21 DIAGNOSIS — M810 Age-related osteoporosis without current pathological fracture: Secondary | ICD-10-CM | POA: Diagnosis not present

## 2019-02-21 DIAGNOSIS — I10 Essential (primary) hypertension: Secondary | ICD-10-CM | POA: Diagnosis not present

## 2019-02-28 DIAGNOSIS — M810 Age-related osteoporosis without current pathological fracture: Secondary | ICD-10-CM | POA: Diagnosis not present

## 2019-02-28 DIAGNOSIS — E782 Mixed hyperlipidemia: Secondary | ICD-10-CM | POA: Diagnosis not present

## 2019-02-28 DIAGNOSIS — F329 Major depressive disorder, single episode, unspecified: Secondary | ICD-10-CM | POA: Diagnosis not present

## 2019-02-28 DIAGNOSIS — S301XXD Contusion of abdominal wall, subsequent encounter: Secondary | ICD-10-CM | POA: Diagnosis not present

## 2019-02-28 DIAGNOSIS — K589 Irritable bowel syndrome without diarrhea: Secondary | ICD-10-CM | POA: Diagnosis not present

## 2019-02-28 DIAGNOSIS — I10 Essential (primary) hypertension: Secondary | ICD-10-CM | POA: Diagnosis not present

## 2019-03-05 DIAGNOSIS — E782 Mixed hyperlipidemia: Secondary | ICD-10-CM | POA: Diagnosis not present

## 2019-03-05 DIAGNOSIS — S301XXD Contusion of abdominal wall, subsequent encounter: Secondary | ICD-10-CM | POA: Diagnosis not present

## 2019-03-05 DIAGNOSIS — M810 Age-related osteoporosis without current pathological fracture: Secondary | ICD-10-CM | POA: Diagnosis not present

## 2019-03-05 DIAGNOSIS — K589 Irritable bowel syndrome without diarrhea: Secondary | ICD-10-CM | POA: Diagnosis not present

## 2019-03-05 DIAGNOSIS — I10 Essential (primary) hypertension: Secondary | ICD-10-CM | POA: Diagnosis not present

## 2019-03-05 DIAGNOSIS — F329 Major depressive disorder, single episode, unspecified: Secondary | ICD-10-CM | POA: Diagnosis not present

## 2019-03-07 DIAGNOSIS — I10 Essential (primary) hypertension: Secondary | ICD-10-CM | POA: Diagnosis not present

## 2019-03-07 DIAGNOSIS — F329 Major depressive disorder, single episode, unspecified: Secondary | ICD-10-CM | POA: Diagnosis not present

## 2019-03-07 DIAGNOSIS — S301XXD Contusion of abdominal wall, subsequent encounter: Secondary | ICD-10-CM | POA: Diagnosis not present

## 2019-03-07 DIAGNOSIS — M810 Age-related osteoporosis without current pathological fracture: Secondary | ICD-10-CM | POA: Diagnosis not present

## 2019-03-07 DIAGNOSIS — E782 Mixed hyperlipidemia: Secondary | ICD-10-CM | POA: Diagnosis not present

## 2019-03-07 DIAGNOSIS — K589 Irritable bowel syndrome without diarrhea: Secondary | ICD-10-CM | POA: Diagnosis not present

## 2019-03-12 DIAGNOSIS — I10 Essential (primary) hypertension: Secondary | ICD-10-CM | POA: Diagnosis not present

## 2019-03-12 DIAGNOSIS — F329 Major depressive disorder, single episode, unspecified: Secondary | ICD-10-CM | POA: Diagnosis not present

## 2019-03-12 DIAGNOSIS — E782 Mixed hyperlipidemia: Secondary | ICD-10-CM | POA: Diagnosis not present

## 2019-03-12 DIAGNOSIS — K589 Irritable bowel syndrome without diarrhea: Secondary | ICD-10-CM | POA: Diagnosis not present

## 2019-03-12 DIAGNOSIS — M810 Age-related osteoporosis without current pathological fracture: Secondary | ICD-10-CM | POA: Diagnosis not present

## 2019-03-12 DIAGNOSIS — S301XXD Contusion of abdominal wall, subsequent encounter: Secondary | ICD-10-CM | POA: Diagnosis not present

## 2019-03-14 DIAGNOSIS — I10 Essential (primary) hypertension: Secondary | ICD-10-CM | POA: Diagnosis not present

## 2019-03-14 DIAGNOSIS — F329 Major depressive disorder, single episode, unspecified: Secondary | ICD-10-CM | POA: Diagnosis not present

## 2019-03-14 DIAGNOSIS — K589 Irritable bowel syndrome without diarrhea: Secondary | ICD-10-CM | POA: Diagnosis not present

## 2019-03-14 DIAGNOSIS — S301XXD Contusion of abdominal wall, subsequent encounter: Secondary | ICD-10-CM | POA: Diagnosis not present

## 2019-03-14 DIAGNOSIS — E782 Mixed hyperlipidemia: Secondary | ICD-10-CM | POA: Diagnosis not present

## 2019-03-14 DIAGNOSIS — M810 Age-related osteoporosis without current pathological fracture: Secondary | ICD-10-CM | POA: Diagnosis not present

## 2019-03-20 DIAGNOSIS — I10 Essential (primary) hypertension: Secondary | ICD-10-CM | POA: Diagnosis not present

## 2019-03-20 DIAGNOSIS — M810 Age-related osteoporosis without current pathological fracture: Secondary | ICD-10-CM | POA: Diagnosis not present

## 2019-03-20 DIAGNOSIS — S301XXD Contusion of abdominal wall, subsequent encounter: Secondary | ICD-10-CM | POA: Diagnosis not present

## 2019-03-20 DIAGNOSIS — E782 Mixed hyperlipidemia: Secondary | ICD-10-CM | POA: Diagnosis not present

## 2019-03-20 DIAGNOSIS — F329 Major depressive disorder, single episode, unspecified: Secondary | ICD-10-CM | POA: Diagnosis not present

## 2019-03-20 DIAGNOSIS — K589 Irritable bowel syndrome without diarrhea: Secondary | ICD-10-CM | POA: Diagnosis not present

## 2019-03-21 DIAGNOSIS — M810 Age-related osteoporosis without current pathological fracture: Secondary | ICD-10-CM | POA: Diagnosis not present

## 2019-03-21 DIAGNOSIS — G3184 Mild cognitive impairment, so stated: Secondary | ICD-10-CM | POA: Diagnosis not present

## 2019-03-21 DIAGNOSIS — E782 Mixed hyperlipidemia: Secondary | ICD-10-CM | POA: Diagnosis not present

## 2019-03-21 DIAGNOSIS — I10 Essential (primary) hypertension: Secondary | ICD-10-CM | POA: Diagnosis not present

## 2019-03-21 DIAGNOSIS — F329 Major depressive disorder, single episode, unspecified: Secondary | ICD-10-CM | POA: Diagnosis not present

## 2019-03-21 DIAGNOSIS — G4700 Insomnia, unspecified: Secondary | ICD-10-CM | POA: Diagnosis not present

## 2019-03-21 DIAGNOSIS — Z9181 History of falling: Secondary | ICD-10-CM | POA: Diagnosis not present

## 2019-03-21 DIAGNOSIS — K589 Irritable bowel syndrome without diarrhea: Secondary | ICD-10-CM | POA: Diagnosis not present

## 2019-03-21 DIAGNOSIS — F17210 Nicotine dependence, cigarettes, uncomplicated: Secondary | ICD-10-CM | POA: Diagnosis not present

## 2019-03-21 DIAGNOSIS — S301XXD Contusion of abdominal wall, subsequent encounter: Secondary | ICD-10-CM | POA: Diagnosis not present

## 2019-03-26 DIAGNOSIS — K589 Irritable bowel syndrome without diarrhea: Secondary | ICD-10-CM | POA: Diagnosis not present

## 2019-03-26 DIAGNOSIS — E782 Mixed hyperlipidemia: Secondary | ICD-10-CM | POA: Diagnosis not present

## 2019-03-26 DIAGNOSIS — I10 Essential (primary) hypertension: Secondary | ICD-10-CM | POA: Diagnosis not present

## 2019-03-26 DIAGNOSIS — F329 Major depressive disorder, single episode, unspecified: Secondary | ICD-10-CM | POA: Diagnosis not present

## 2019-03-26 DIAGNOSIS — S301XXD Contusion of abdominal wall, subsequent encounter: Secondary | ICD-10-CM | POA: Diagnosis not present

## 2019-03-26 DIAGNOSIS — M810 Age-related osteoporosis without current pathological fracture: Secondary | ICD-10-CM | POA: Diagnosis not present

## 2019-03-28 DIAGNOSIS — E782 Mixed hyperlipidemia: Secondary | ICD-10-CM | POA: Diagnosis not present

## 2019-03-28 DIAGNOSIS — K589 Irritable bowel syndrome without diarrhea: Secondary | ICD-10-CM | POA: Diagnosis not present

## 2019-03-28 DIAGNOSIS — S301XXD Contusion of abdominal wall, subsequent encounter: Secondary | ICD-10-CM | POA: Diagnosis not present

## 2019-03-28 DIAGNOSIS — I10 Essential (primary) hypertension: Secondary | ICD-10-CM | POA: Diagnosis not present

## 2019-03-28 DIAGNOSIS — F329 Major depressive disorder, single episode, unspecified: Secondary | ICD-10-CM | POA: Diagnosis not present

## 2019-03-28 DIAGNOSIS — M810 Age-related osteoporosis without current pathological fracture: Secondary | ICD-10-CM | POA: Diagnosis not present

## 2019-04-02 DIAGNOSIS — S301XXD Contusion of abdominal wall, subsequent encounter: Secondary | ICD-10-CM | POA: Diagnosis not present

## 2019-04-02 DIAGNOSIS — E782 Mixed hyperlipidemia: Secondary | ICD-10-CM | POA: Diagnosis not present

## 2019-04-02 DIAGNOSIS — M810 Age-related osteoporosis without current pathological fracture: Secondary | ICD-10-CM | POA: Diagnosis not present

## 2019-04-02 DIAGNOSIS — I10 Essential (primary) hypertension: Secondary | ICD-10-CM | POA: Diagnosis not present

## 2019-04-02 DIAGNOSIS — K589 Irritable bowel syndrome without diarrhea: Secondary | ICD-10-CM | POA: Diagnosis not present

## 2019-04-02 DIAGNOSIS — F329 Major depressive disorder, single episode, unspecified: Secondary | ICD-10-CM | POA: Diagnosis not present

## 2019-04-04 DIAGNOSIS — E782 Mixed hyperlipidemia: Secondary | ICD-10-CM | POA: Diagnosis not present

## 2019-04-04 DIAGNOSIS — S301XXD Contusion of abdominal wall, subsequent encounter: Secondary | ICD-10-CM | POA: Diagnosis not present

## 2019-04-04 DIAGNOSIS — M810 Age-related osteoporosis without current pathological fracture: Secondary | ICD-10-CM | POA: Diagnosis not present

## 2019-04-04 DIAGNOSIS — I10 Essential (primary) hypertension: Secondary | ICD-10-CM | POA: Diagnosis not present

## 2019-04-04 DIAGNOSIS — K589 Irritable bowel syndrome without diarrhea: Secondary | ICD-10-CM | POA: Diagnosis not present

## 2019-04-04 DIAGNOSIS — F329 Major depressive disorder, single episode, unspecified: Secondary | ICD-10-CM | POA: Diagnosis not present

## 2019-04-09 DIAGNOSIS — E782 Mixed hyperlipidemia: Secondary | ICD-10-CM | POA: Diagnosis not present

## 2019-04-09 DIAGNOSIS — K589 Irritable bowel syndrome without diarrhea: Secondary | ICD-10-CM | POA: Diagnosis not present

## 2019-04-09 DIAGNOSIS — S301XXD Contusion of abdominal wall, subsequent encounter: Secondary | ICD-10-CM | POA: Diagnosis not present

## 2019-04-09 DIAGNOSIS — M810 Age-related osteoporosis without current pathological fracture: Secondary | ICD-10-CM | POA: Diagnosis not present

## 2019-04-09 DIAGNOSIS — I10 Essential (primary) hypertension: Secondary | ICD-10-CM | POA: Diagnosis not present

## 2019-04-09 DIAGNOSIS — F329 Major depressive disorder, single episode, unspecified: Secondary | ICD-10-CM | POA: Diagnosis not present

## 2019-04-11 DIAGNOSIS — K589 Irritable bowel syndrome without diarrhea: Secondary | ICD-10-CM | POA: Diagnosis not present

## 2019-04-11 DIAGNOSIS — S301XXD Contusion of abdominal wall, subsequent encounter: Secondary | ICD-10-CM | POA: Diagnosis not present

## 2019-04-11 DIAGNOSIS — E782 Mixed hyperlipidemia: Secondary | ICD-10-CM | POA: Diagnosis not present

## 2019-04-11 DIAGNOSIS — I10 Essential (primary) hypertension: Secondary | ICD-10-CM | POA: Diagnosis not present

## 2019-04-11 DIAGNOSIS — M810 Age-related osteoporosis without current pathological fracture: Secondary | ICD-10-CM | POA: Diagnosis not present

## 2019-04-11 DIAGNOSIS — F329 Major depressive disorder, single episode, unspecified: Secondary | ICD-10-CM | POA: Diagnosis not present

## 2019-04-16 DIAGNOSIS — E782 Mixed hyperlipidemia: Secondary | ICD-10-CM | POA: Diagnosis not present

## 2019-04-16 DIAGNOSIS — I10 Essential (primary) hypertension: Secondary | ICD-10-CM | POA: Diagnosis not present

## 2019-04-16 DIAGNOSIS — F329 Major depressive disorder, single episode, unspecified: Secondary | ICD-10-CM | POA: Diagnosis not present

## 2019-04-16 DIAGNOSIS — S301XXD Contusion of abdominal wall, subsequent encounter: Secondary | ICD-10-CM | POA: Diagnosis not present

## 2019-04-16 DIAGNOSIS — K589 Irritable bowel syndrome without diarrhea: Secondary | ICD-10-CM | POA: Diagnosis not present

## 2019-04-16 DIAGNOSIS — M810 Age-related osteoporosis without current pathological fracture: Secondary | ICD-10-CM | POA: Diagnosis not present

## 2019-04-18 DIAGNOSIS — S301XXD Contusion of abdominal wall, subsequent encounter: Secondary | ICD-10-CM | POA: Diagnosis not present

## 2019-04-18 DIAGNOSIS — K589 Irritable bowel syndrome without diarrhea: Secondary | ICD-10-CM | POA: Diagnosis not present

## 2019-04-18 DIAGNOSIS — I10 Essential (primary) hypertension: Secondary | ICD-10-CM | POA: Diagnosis not present

## 2019-04-18 DIAGNOSIS — E782 Mixed hyperlipidemia: Secondary | ICD-10-CM | POA: Diagnosis not present

## 2019-04-18 DIAGNOSIS — M810 Age-related osteoporosis without current pathological fracture: Secondary | ICD-10-CM | POA: Diagnosis not present

## 2019-04-18 DIAGNOSIS — F329 Major depressive disorder, single episode, unspecified: Secondary | ICD-10-CM | POA: Diagnosis not present

## 2019-04-19 ENCOUNTER — Telehealth: Payer: Self-pay | Admitting: Adult Health

## 2019-04-19 MED ORDER — CEREFOLIN NAC 6-90.314-2-600 MG PO TABS: 1.0000 | ORAL_TABLET | Freq: Every day | ORAL | 0 refills | Status: DC

## 2019-04-19 NOTE — Addendum Note (Signed)
Addended by: Brandon Melnick on: 04/19/2019 04:36 PM   Modules accepted: Orders

## 2019-04-19 NOTE — Telephone Encounter (Signed)
Merry Proud @ [facility](Mail Order in [place], [place]) has called for a refill on  Methylfol-Algae-B12-Acetylcyst (CEREFOLIN NAC) [phone] MG TABS [facility](Mail order in [place]) [phone]

## 2019-04-19 NOTE — Telephone Encounter (Signed)
Pt has appt in 6/20.  Gave 90 day supply.

## 2019-05-22 DIAGNOSIS — G3184 Mild cognitive impairment, so stated: Secondary | ICD-10-CM | POA: Diagnosis not present

## 2019-05-22 DIAGNOSIS — M810 Age-related osteoporosis without current pathological fracture: Secondary | ICD-10-CM | POA: Diagnosis not present

## 2019-05-22 DIAGNOSIS — Z9181 History of falling: Secondary | ICD-10-CM | POA: Diagnosis not present

## 2019-05-22 DIAGNOSIS — F329 Major depressive disorder, single episode, unspecified: Secondary | ICD-10-CM | POA: Diagnosis not present

## 2019-05-22 DIAGNOSIS — E782 Mixed hyperlipidemia: Secondary | ICD-10-CM | POA: Diagnosis not present

## 2019-05-22 DIAGNOSIS — I10 Essential (primary) hypertension: Secondary | ICD-10-CM | POA: Diagnosis not present

## 2019-05-22 DIAGNOSIS — F17210 Nicotine dependence, cigarettes, uncomplicated: Secondary | ICD-10-CM | POA: Diagnosis not present

## 2019-05-22 DIAGNOSIS — G4700 Insomnia, unspecified: Secondary | ICD-10-CM | POA: Diagnosis not present

## 2019-05-25 DIAGNOSIS — G3184 Mild cognitive impairment, so stated: Secondary | ICD-10-CM | POA: Diagnosis not present

## 2019-05-25 DIAGNOSIS — R270 Ataxia, unspecified: Secondary | ICD-10-CM | POA: Diagnosis not present

## 2019-05-25 DIAGNOSIS — Z7189 Other specified counseling: Secondary | ICD-10-CM | POA: Diagnosis not present

## 2019-05-28 ENCOUNTER — Other Ambulatory Visit: Payer: Self-pay | Admitting: Neurology

## 2019-05-28 DIAGNOSIS — E782 Mixed hyperlipidemia: Secondary | ICD-10-CM | POA: Diagnosis not present

## 2019-05-28 DIAGNOSIS — I10 Essential (primary) hypertension: Secondary | ICD-10-CM | POA: Diagnosis not present

## 2019-05-28 DIAGNOSIS — M810 Age-related osteoporosis without current pathological fracture: Secondary | ICD-10-CM | POA: Diagnosis not present

## 2019-05-28 DIAGNOSIS — F329 Major depressive disorder, single episode, unspecified: Secondary | ICD-10-CM | POA: Diagnosis not present

## 2019-05-28 DIAGNOSIS — F0280 Dementia in other diseases classified elsewhere without behavioral disturbance: Secondary | ICD-10-CM

## 2019-05-28 DIAGNOSIS — G3184 Mild cognitive impairment, so stated: Secondary | ICD-10-CM | POA: Diagnosis not present

## 2019-05-28 DIAGNOSIS — G4700 Insomnia, unspecified: Secondary | ICD-10-CM | POA: Diagnosis not present

## 2019-05-30 DIAGNOSIS — I10 Essential (primary) hypertension: Secondary | ICD-10-CM | POA: Diagnosis not present

## 2019-05-30 DIAGNOSIS — M810 Age-related osteoporosis without current pathological fracture: Secondary | ICD-10-CM | POA: Diagnosis not present

## 2019-05-30 DIAGNOSIS — E782 Mixed hyperlipidemia: Secondary | ICD-10-CM | POA: Diagnosis not present

## 2019-05-30 DIAGNOSIS — G4700 Insomnia, unspecified: Secondary | ICD-10-CM | POA: Diagnosis not present

## 2019-05-30 DIAGNOSIS — G3184 Mild cognitive impairment, so stated: Secondary | ICD-10-CM | POA: Diagnosis not present

## 2019-05-30 DIAGNOSIS — F329 Major depressive disorder, single episode, unspecified: Secondary | ICD-10-CM | POA: Diagnosis not present

## 2019-05-31 ENCOUNTER — Telehealth: Payer: Self-pay | Admitting: *Deleted

## 2019-05-31 NOTE — Telephone Encounter (Signed)
Called spoke to pt, I think husband there as well.  Consented to doxy.me visit Due to current COVID 19 pandemic, our office is severely reducing in office visits until further notice, in order to minimize the risk to our patients and healthcare providers.  Pt understands that although there may be some limitations with this type of visit, we will take all precautions to reduce any security or privacy concerns.  Pt understands that this will be treated like an in office visit and we will file with pt's insurance, and there may be a patient responsible charge related to this service.  email sent to [email or web address].

## 2019-06-04 DIAGNOSIS — F329 Major depressive disorder, single episode, unspecified: Secondary | ICD-10-CM | POA: Diagnosis not present

## 2019-06-04 DIAGNOSIS — E782 Mixed hyperlipidemia: Secondary | ICD-10-CM | POA: Diagnosis not present

## 2019-06-04 DIAGNOSIS — I10 Essential (primary) hypertension: Secondary | ICD-10-CM | POA: Diagnosis not present

## 2019-06-04 DIAGNOSIS — G4700 Insomnia, unspecified: Secondary | ICD-10-CM | POA: Diagnosis not present

## 2019-06-04 DIAGNOSIS — G3184 Mild cognitive impairment, so stated: Secondary | ICD-10-CM | POA: Diagnosis not present

## 2019-06-04 DIAGNOSIS — M810 Age-related osteoporosis without current pathological fracture: Secondary | ICD-10-CM | POA: Diagnosis not present

## 2019-06-05 ENCOUNTER — Ambulatory Visit (INDEPENDENT_AMBULATORY_CARE_PROVIDER_SITE_OTHER): Payer: Medicare Other | Admitting: Adult Health

## 2019-06-05 ENCOUNTER — Encounter: Payer: Self-pay | Admitting: Adult Health

## 2019-06-05 ENCOUNTER — Other Ambulatory Visit: Payer: Self-pay

## 2019-06-05 DIAGNOSIS — G301 Alzheimer's disease with late onset: Secondary | ICD-10-CM | POA: Diagnosis not present

## 2019-06-05 DIAGNOSIS — F0280 Dementia in other diseases classified elsewhere without behavioral disturbance: Secondary | ICD-10-CM | POA: Diagnosis not present

## 2019-06-05 NOTE — Progress Notes (Signed)
  [facility] [address] [place]. [place] [address] [phone]     Virtual Visit via Telephone Note  I connected with Brooke [name] on 06/05/19 at  9:30 AM EDT by telephone located remotely at [facility] and verified that I am speaking with the correct person using two identifiers who reports being located at home.    Visit scheduled by RN. I discussed the limitations, risks, security and privacy concerns of performing an evaluation and management service by telephone and the availability of in person appointments. I also discussed with the patient that there may be a patient responsible charge related to this service. The patient's husband expressed understanding and agreed to proceed.   History of Present Illness:  Brooke [name] is a 85 y.o. female who has been followed in this office for memory disturbance.  She was initially scheduled for face-to-face office follow up visit today time but due to [place], visit rescheduled for non-face-to-face telephone visit with patients consent. Unable to participate in video visit due to lack of access to device with camera.    Today 06/05/19 Brooke [name] is an 85 year old female with a history of memory disturbance.  She was unable to do a virtual visit so we converted to a telephone visit.  I spoke with her husband primarily.  Her memory score at the last visit was 18.  He states that she requires assistance with all ADLs.  He has an aide that comes in twice a week.  She no longer does any cooking.    Observations/Objective:  Neurological examination  Mentation: Alert oriented to time, place, history taking. Follows all commands speech and language fluent  Assessment and Plan:  1.  Memory disturbance  According to the patient husband the patient has remained relatively stable.  She will continue on Aricept, Namenda and Cerefolin.  Advised that if her symptoms worsen or she develops new symptoms  they should let us know.  She will follow-up in 6 months or sooner as needed  Follow Up Instructions:    FU 6 months   I discussed the assessment and treatment plan with the patient.  The patient was provided an opportunity to ask questions and all were answered to their satisfaction. The patient agreed with the plan and verbalized an understanding of the instructions.   I provided 12 minutes of non-face-to-face time during this encounter.    Ward Givens, MSN, NP-C 06/05/2019, 9:01 AM [facility] [address] [place],  [address] [phone]

## 2019-06-11 DIAGNOSIS — E782 Mixed hyperlipidemia: Secondary | ICD-10-CM | POA: Diagnosis not present

## 2019-06-11 DIAGNOSIS — F329 Major depressive disorder, single episode, unspecified: Secondary | ICD-10-CM | POA: Diagnosis not present

## 2019-06-11 DIAGNOSIS — G3184 Mild cognitive impairment, so stated: Secondary | ICD-10-CM | POA: Diagnosis not present

## 2019-06-11 DIAGNOSIS — G4700 Insomnia, unspecified: Secondary | ICD-10-CM | POA: Diagnosis not present

## 2019-06-11 DIAGNOSIS — I10 Essential (primary) hypertension: Secondary | ICD-10-CM | POA: Diagnosis not present

## 2019-06-11 DIAGNOSIS — M810 Age-related osteoporosis without current pathological fracture: Secondary | ICD-10-CM | POA: Diagnosis not present

## 2019-06-14 DIAGNOSIS — M810 Age-related osteoporosis without current pathological fracture: Secondary | ICD-10-CM | POA: Diagnosis not present

## 2019-06-14 DIAGNOSIS — I10 Essential (primary) hypertension: Secondary | ICD-10-CM | POA: Diagnosis not present

## 2019-06-14 DIAGNOSIS — G3184 Mild cognitive impairment, so stated: Secondary | ICD-10-CM | POA: Diagnosis not present

## 2019-06-14 DIAGNOSIS — F329 Major depressive disorder, single episode, unspecified: Secondary | ICD-10-CM | POA: Diagnosis not present

## 2019-06-14 DIAGNOSIS — G4700 Insomnia, unspecified: Secondary | ICD-10-CM | POA: Diagnosis not present

## 2019-06-14 DIAGNOSIS — E782 Mixed hyperlipidemia: Secondary | ICD-10-CM | POA: Diagnosis not present

## 2019-06-18 DIAGNOSIS — F329 Major depressive disorder, single episode, unspecified: Secondary | ICD-10-CM | POA: Diagnosis not present

## 2019-06-18 DIAGNOSIS — M810 Age-related osteoporosis without current pathological fracture: Secondary | ICD-10-CM | POA: Diagnosis not present

## 2019-06-18 DIAGNOSIS — I10 Essential (primary) hypertension: Secondary | ICD-10-CM | POA: Diagnosis not present

## 2019-06-18 DIAGNOSIS — E782 Mixed hyperlipidemia: Secondary | ICD-10-CM | POA: Diagnosis not present

## 2019-06-18 DIAGNOSIS — G3184 Mild cognitive impairment, so stated: Secondary | ICD-10-CM | POA: Diagnosis not present

## 2019-06-18 DIAGNOSIS — G4700 Insomnia, unspecified: Secondary | ICD-10-CM | POA: Diagnosis not present

## 2019-06-20 DIAGNOSIS — G3184 Mild cognitive impairment, so stated: Secondary | ICD-10-CM | POA: Diagnosis not present

## 2019-06-20 DIAGNOSIS — F329 Major depressive disorder, single episode, unspecified: Secondary | ICD-10-CM | POA: Diagnosis not present

## 2019-06-20 DIAGNOSIS — M810 Age-related osteoporosis without current pathological fracture: Secondary | ICD-10-CM | POA: Diagnosis not present

## 2019-06-20 DIAGNOSIS — I10 Essential (primary) hypertension: Secondary | ICD-10-CM | POA: Diagnosis not present

## 2019-06-20 DIAGNOSIS — E782 Mixed hyperlipidemia: Secondary | ICD-10-CM | POA: Diagnosis not present

## 2019-06-20 DIAGNOSIS — G4700 Insomnia, unspecified: Secondary | ICD-10-CM | POA: Diagnosis not present

## 2019-06-21 DIAGNOSIS — I10 Essential (primary) hypertension: Secondary | ICD-10-CM | POA: Diagnosis not present

## 2019-06-21 DIAGNOSIS — E782 Mixed hyperlipidemia: Secondary | ICD-10-CM | POA: Diagnosis not present

## 2019-06-21 DIAGNOSIS — M810 Age-related osteoporosis without current pathological fracture: Secondary | ICD-10-CM | POA: Diagnosis not present

## 2019-06-21 DIAGNOSIS — Z9181 History of falling: Secondary | ICD-10-CM | POA: Diagnosis not present

## 2019-06-21 DIAGNOSIS — G3184 Mild cognitive impairment, so stated: Secondary | ICD-10-CM | POA: Diagnosis not present

## 2019-06-21 DIAGNOSIS — G4700 Insomnia, unspecified: Secondary | ICD-10-CM | POA: Diagnosis not present

## 2019-06-21 DIAGNOSIS — F17210 Nicotine dependence, cigarettes, uncomplicated: Secondary | ICD-10-CM | POA: Diagnosis not present

## 2019-06-21 DIAGNOSIS — F329 Major depressive disorder, single episode, unspecified: Secondary | ICD-10-CM | POA: Diagnosis not present

## 2019-06-23 ENCOUNTER — Other Ambulatory Visit: Payer: Self-pay | Admitting: Adult Health

## 2019-06-23 DIAGNOSIS — F0280 Dementia in other diseases classified elsewhere without behavioral disturbance: Secondary | ICD-10-CM

## 2019-06-25 DIAGNOSIS — G4700 Insomnia, unspecified: Secondary | ICD-10-CM | POA: Diagnosis not present

## 2019-06-25 DIAGNOSIS — F329 Major depressive disorder, single episode, unspecified: Secondary | ICD-10-CM | POA: Diagnosis not present

## 2019-06-25 DIAGNOSIS — G3184 Mild cognitive impairment, so stated: Secondary | ICD-10-CM | POA: Diagnosis not present

## 2019-06-25 DIAGNOSIS — E782 Mixed hyperlipidemia: Secondary | ICD-10-CM | POA: Diagnosis not present

## 2019-06-25 DIAGNOSIS — M810 Age-related osteoporosis without current pathological fracture: Secondary | ICD-10-CM | POA: Diagnosis not present

## 2019-06-25 DIAGNOSIS — I10 Essential (primary) hypertension: Secondary | ICD-10-CM | POA: Diagnosis not present

## 2019-06-27 DIAGNOSIS — M810 Age-related osteoporosis without current pathological fracture: Secondary | ICD-10-CM | POA: Diagnosis not present

## 2019-06-27 DIAGNOSIS — G3184 Mild cognitive impairment, so stated: Secondary | ICD-10-CM | POA: Diagnosis not present

## 2019-06-27 DIAGNOSIS — G4700 Insomnia, unspecified: Secondary | ICD-10-CM | POA: Diagnosis not present

## 2019-06-27 DIAGNOSIS — E782 Mixed hyperlipidemia: Secondary | ICD-10-CM | POA: Diagnosis not present

## 2019-06-27 DIAGNOSIS — I10 Essential (primary) hypertension: Secondary | ICD-10-CM | POA: Diagnosis not present

## 2019-06-27 DIAGNOSIS — F329 Major depressive disorder, single episode, unspecified: Secondary | ICD-10-CM | POA: Diagnosis not present

## 2019-07-02 DIAGNOSIS — M810 Age-related osteoporosis without current pathological fracture: Secondary | ICD-10-CM | POA: Diagnosis not present

## 2019-07-02 DIAGNOSIS — E782 Mixed hyperlipidemia: Secondary | ICD-10-CM | POA: Diagnosis not present

## 2019-07-02 DIAGNOSIS — F329 Major depressive disorder, single episode, unspecified: Secondary | ICD-10-CM | POA: Diagnosis not present

## 2019-07-02 DIAGNOSIS — I10 Essential (primary) hypertension: Secondary | ICD-10-CM | POA: Diagnosis not present

## 2019-07-02 DIAGNOSIS — G3184 Mild cognitive impairment, so stated: Secondary | ICD-10-CM | POA: Diagnosis not present

## 2019-07-02 DIAGNOSIS — G4700 Insomnia, unspecified: Secondary | ICD-10-CM | POA: Diagnosis not present

## 2019-07-05 DIAGNOSIS — E782 Mixed hyperlipidemia: Secondary | ICD-10-CM | POA: Diagnosis not present

## 2019-07-05 DIAGNOSIS — I10 Essential (primary) hypertension: Secondary | ICD-10-CM | POA: Diagnosis not present

## 2019-07-09 DIAGNOSIS — Z7189 Other specified counseling: Secondary | ICD-10-CM | POA: Diagnosis not present

## 2019-07-09 DIAGNOSIS — E782 Mixed hyperlipidemia: Secondary | ICD-10-CM | POA: Diagnosis not present

## 2019-07-09 DIAGNOSIS — M810 Age-related osteoporosis without current pathological fracture: Secondary | ICD-10-CM | POA: Diagnosis not present

## 2019-07-09 DIAGNOSIS — F329 Major depressive disorder, single episode, unspecified: Secondary | ICD-10-CM | POA: Diagnosis not present

## 2019-07-09 DIAGNOSIS — R413 Other amnesia: Secondary | ICD-10-CM | POA: Diagnosis not present

## 2019-07-09 DIAGNOSIS — I10 Essential (primary) hypertension: Secondary | ICD-10-CM | POA: Diagnosis not present

## 2019-07-09 DIAGNOSIS — G3184 Mild cognitive impairment, so stated: Secondary | ICD-10-CM | POA: Diagnosis not present

## 2019-07-09 DIAGNOSIS — G4700 Insomnia, unspecified: Secondary | ICD-10-CM | POA: Diagnosis not present

## 2019-07-10 ENCOUNTER — Other Ambulatory Visit: Payer: Self-pay | Admitting: Adult Health

## 2019-07-11 DIAGNOSIS — M810 Age-related osteoporosis without current pathological fracture: Secondary | ICD-10-CM | POA: Diagnosis not present

## 2019-07-11 DIAGNOSIS — E782 Mixed hyperlipidemia: Secondary | ICD-10-CM | POA: Diagnosis not present

## 2019-07-11 DIAGNOSIS — F329 Major depressive disorder, single episode, unspecified: Secondary | ICD-10-CM | POA: Diagnosis not present

## 2019-07-11 DIAGNOSIS — I10 Essential (primary) hypertension: Secondary | ICD-10-CM | POA: Diagnosis not present

## 2019-07-11 DIAGNOSIS — G3184 Mild cognitive impairment, so stated: Secondary | ICD-10-CM | POA: Diagnosis not present

## 2019-07-11 DIAGNOSIS — G4700 Insomnia, unspecified: Secondary | ICD-10-CM | POA: Diagnosis not present

## 2019-07-17 DIAGNOSIS — G3184 Mild cognitive impairment, so stated: Secondary | ICD-10-CM | POA: Diagnosis not present

## 2019-07-17 DIAGNOSIS — G4700 Insomnia, unspecified: Secondary | ICD-10-CM | POA: Diagnosis not present

## 2019-07-17 DIAGNOSIS — I10 Essential (primary) hypertension: Secondary | ICD-10-CM | POA: Diagnosis not present

## 2019-07-17 DIAGNOSIS — E782 Mixed hyperlipidemia: Secondary | ICD-10-CM | POA: Diagnosis not present

## 2019-07-17 DIAGNOSIS — F329 Major depressive disorder, single episode, unspecified: Secondary | ICD-10-CM | POA: Diagnosis not present

## 2019-07-17 DIAGNOSIS — M810 Age-related osteoporosis without current pathological fracture: Secondary | ICD-10-CM | POA: Diagnosis not present

## 2019-07-18 DIAGNOSIS — G3184 Mild cognitive impairment, so stated: Secondary | ICD-10-CM | POA: Diagnosis not present

## 2019-07-18 DIAGNOSIS — F329 Major depressive disorder, single episode, unspecified: Secondary | ICD-10-CM | POA: Diagnosis not present

## 2019-07-18 DIAGNOSIS — G4700 Insomnia, unspecified: Secondary | ICD-10-CM | POA: Diagnosis not present

## 2019-07-18 DIAGNOSIS — M810 Age-related osteoporosis without current pathological fracture: Secondary | ICD-10-CM | POA: Diagnosis not present

## 2019-07-18 DIAGNOSIS — E782 Mixed hyperlipidemia: Secondary | ICD-10-CM | POA: Diagnosis not present

## 2019-07-18 DIAGNOSIS — I10 Essential (primary) hypertension: Secondary | ICD-10-CM | POA: Diagnosis not present

## 2019-07-21 DIAGNOSIS — F329 Major depressive disorder, single episode, unspecified: Secondary | ICD-10-CM | POA: Diagnosis not present

## 2019-07-21 DIAGNOSIS — I10 Essential (primary) hypertension: Secondary | ICD-10-CM | POA: Diagnosis not present

## 2019-07-21 DIAGNOSIS — G4700 Insomnia, unspecified: Secondary | ICD-10-CM | POA: Diagnosis not present

## 2019-07-21 DIAGNOSIS — Z9181 History of falling: Secondary | ICD-10-CM | POA: Diagnosis not present

## 2019-07-21 DIAGNOSIS — M810 Age-related osteoporosis without current pathological fracture: Secondary | ICD-10-CM | POA: Diagnosis not present

## 2019-07-21 DIAGNOSIS — E782 Mixed hyperlipidemia: Secondary | ICD-10-CM | POA: Diagnosis not present

## 2019-07-21 DIAGNOSIS — F17210 Nicotine dependence, cigarettes, uncomplicated: Secondary | ICD-10-CM | POA: Diagnosis not present

## 2019-07-21 DIAGNOSIS — G3184 Mild cognitive impairment, so stated: Secondary | ICD-10-CM | POA: Diagnosis not present

## 2019-07-23 DIAGNOSIS — M810 Age-related osteoporosis without current pathological fracture: Secondary | ICD-10-CM | POA: Diagnosis not present

## 2019-07-23 DIAGNOSIS — G4700 Insomnia, unspecified: Secondary | ICD-10-CM | POA: Diagnosis not present

## 2019-07-23 DIAGNOSIS — I10 Essential (primary) hypertension: Secondary | ICD-10-CM | POA: Diagnosis not present

## 2019-07-23 DIAGNOSIS — F329 Major depressive disorder, single episode, unspecified: Secondary | ICD-10-CM | POA: Diagnosis not present

## 2019-07-23 DIAGNOSIS — E782 Mixed hyperlipidemia: Secondary | ICD-10-CM | POA: Diagnosis not present

## 2019-07-23 DIAGNOSIS — G3184 Mild cognitive impairment, so stated: Secondary | ICD-10-CM | POA: Diagnosis not present

## 2019-07-25 DIAGNOSIS — G4700 Insomnia, unspecified: Secondary | ICD-10-CM | POA: Diagnosis not present

## 2019-07-25 DIAGNOSIS — G3184 Mild cognitive impairment, so stated: Secondary | ICD-10-CM | POA: Diagnosis not present

## 2019-07-25 DIAGNOSIS — E782 Mixed hyperlipidemia: Secondary | ICD-10-CM | POA: Diagnosis not present

## 2019-07-25 DIAGNOSIS — M810 Age-related osteoporosis without current pathological fracture: Secondary | ICD-10-CM | POA: Diagnosis not present

## 2019-07-25 DIAGNOSIS — F329 Major depressive disorder, single episode, unspecified: Secondary | ICD-10-CM | POA: Diagnosis not present

## 2019-07-25 DIAGNOSIS — I10 Essential (primary) hypertension: Secondary | ICD-10-CM | POA: Diagnosis not present

## 2019-07-30 DIAGNOSIS — E782 Mixed hyperlipidemia: Secondary | ICD-10-CM | POA: Diagnosis not present

## 2019-07-30 DIAGNOSIS — G4700 Insomnia, unspecified: Secondary | ICD-10-CM | POA: Diagnosis not present

## 2019-07-30 DIAGNOSIS — F329 Major depressive disorder, single episode, unspecified: Secondary | ICD-10-CM | POA: Diagnosis not present

## 2019-07-30 DIAGNOSIS — G3184 Mild cognitive impairment, so stated: Secondary | ICD-10-CM | POA: Diagnosis not present

## 2019-07-30 DIAGNOSIS — I10 Essential (primary) hypertension: Secondary | ICD-10-CM | POA: Diagnosis not present

## 2019-07-30 DIAGNOSIS — M810 Age-related osteoporosis without current pathological fracture: Secondary | ICD-10-CM | POA: Diagnosis not present

## 2019-08-01 DIAGNOSIS — I10 Essential (primary) hypertension: Secondary | ICD-10-CM | POA: Diagnosis not present

## 2019-08-01 DIAGNOSIS — F329 Major depressive disorder, single episode, unspecified: Secondary | ICD-10-CM | POA: Diagnosis not present

## 2019-08-01 DIAGNOSIS — E782 Mixed hyperlipidemia: Secondary | ICD-10-CM | POA: Diagnosis not present

## 2019-08-01 DIAGNOSIS — G4700 Insomnia, unspecified: Secondary | ICD-10-CM | POA: Diagnosis not present

## 2019-08-01 DIAGNOSIS — M810 Age-related osteoporosis without current pathological fracture: Secondary | ICD-10-CM | POA: Diagnosis not present

## 2019-08-01 DIAGNOSIS — G3184 Mild cognitive impairment, so stated: Secondary | ICD-10-CM | POA: Diagnosis not present

## 2019-08-06 DIAGNOSIS — E782 Mixed hyperlipidemia: Secondary | ICD-10-CM | POA: Diagnosis not present

## 2019-08-06 DIAGNOSIS — G4700 Insomnia, unspecified: Secondary | ICD-10-CM | POA: Diagnosis not present

## 2019-08-06 DIAGNOSIS — M810 Age-related osteoporosis without current pathological fracture: Secondary | ICD-10-CM | POA: Diagnosis not present

## 2019-08-06 DIAGNOSIS — I10 Essential (primary) hypertension: Secondary | ICD-10-CM | POA: Diagnosis not present

## 2019-08-06 DIAGNOSIS — G3184 Mild cognitive impairment, so stated: Secondary | ICD-10-CM | POA: Diagnosis not present

## 2019-08-06 DIAGNOSIS — F329 Major depressive disorder, single episode, unspecified: Secondary | ICD-10-CM | POA: Diagnosis not present

## 2019-08-07 DIAGNOSIS — I10 Essential (primary) hypertension: Secondary | ICD-10-CM | POA: Diagnosis not present

## 2019-08-07 DIAGNOSIS — E782 Mixed hyperlipidemia: Secondary | ICD-10-CM | POA: Diagnosis not present

## 2019-08-07 DIAGNOSIS — M810 Age-related osteoporosis without current pathological fracture: Secondary | ICD-10-CM | POA: Diagnosis not present

## 2019-08-07 DIAGNOSIS — G3184 Mild cognitive impairment, so stated: Secondary | ICD-10-CM | POA: Diagnosis not present

## 2019-08-07 DIAGNOSIS — G4700 Insomnia, unspecified: Secondary | ICD-10-CM | POA: Diagnosis not present

## 2019-08-07 DIAGNOSIS — F329 Major depressive disorder, single episode, unspecified: Secondary | ICD-10-CM | POA: Diagnosis not present

## 2019-08-13 DIAGNOSIS — G3184 Mild cognitive impairment, so stated: Secondary | ICD-10-CM | POA: Diagnosis not present

## 2019-08-13 DIAGNOSIS — M810 Age-related osteoporosis without current pathological fracture: Secondary | ICD-10-CM | POA: Diagnosis not present

## 2019-08-13 DIAGNOSIS — G4700 Insomnia, unspecified: Secondary | ICD-10-CM | POA: Diagnosis not present

## 2019-08-13 DIAGNOSIS — I10 Essential (primary) hypertension: Secondary | ICD-10-CM | POA: Diagnosis not present

## 2019-08-13 DIAGNOSIS — F329 Major depressive disorder, single episode, unspecified: Secondary | ICD-10-CM | POA: Diagnosis not present

## 2019-08-13 DIAGNOSIS — E782 Mixed hyperlipidemia: Secondary | ICD-10-CM | POA: Diagnosis not present

## 2019-08-15 DIAGNOSIS — E782 Mixed hyperlipidemia: Secondary | ICD-10-CM | POA: Diagnosis not present

## 2019-08-15 DIAGNOSIS — I10 Essential (primary) hypertension: Secondary | ICD-10-CM | POA: Diagnosis not present

## 2019-08-15 DIAGNOSIS — M810 Age-related osteoporosis without current pathological fracture: Secondary | ICD-10-CM | POA: Diagnosis not present

## 2019-08-15 DIAGNOSIS — G4700 Insomnia, unspecified: Secondary | ICD-10-CM | POA: Diagnosis not present

## 2019-08-15 DIAGNOSIS — G3184 Mild cognitive impairment, so stated: Secondary | ICD-10-CM | POA: Diagnosis not present

## 2019-08-15 DIAGNOSIS — F329 Major depressive disorder, single episode, unspecified: Secondary | ICD-10-CM | POA: Diagnosis not present

## 2019-08-20 DIAGNOSIS — F329 Major depressive disorder, single episode, unspecified: Secondary | ICD-10-CM | POA: Diagnosis not present

## 2019-08-20 DIAGNOSIS — G4700 Insomnia, unspecified: Secondary | ICD-10-CM | POA: Diagnosis not present

## 2019-08-20 DIAGNOSIS — F17210 Nicotine dependence, cigarettes, uncomplicated: Secondary | ICD-10-CM | POA: Diagnosis not present

## 2019-08-20 DIAGNOSIS — I10 Essential (primary) hypertension: Secondary | ICD-10-CM | POA: Diagnosis not present

## 2019-08-20 DIAGNOSIS — G3184 Mild cognitive impairment, so stated: Secondary | ICD-10-CM | POA: Diagnosis not present

## 2019-08-20 DIAGNOSIS — E782 Mixed hyperlipidemia: Secondary | ICD-10-CM | POA: Diagnosis not present

## 2019-08-20 DIAGNOSIS — M810 Age-related osteoporosis without current pathological fracture: Secondary | ICD-10-CM | POA: Diagnosis not present

## 2019-08-20 DIAGNOSIS — Z9181 History of falling: Secondary | ICD-10-CM | POA: Diagnosis not present

## 2019-08-22 DIAGNOSIS — E782 Mixed hyperlipidemia: Secondary | ICD-10-CM | POA: Diagnosis not present

## 2019-08-22 DIAGNOSIS — F329 Major depressive disorder, single episode, unspecified: Secondary | ICD-10-CM | POA: Diagnosis not present

## 2019-08-22 DIAGNOSIS — I10 Essential (primary) hypertension: Secondary | ICD-10-CM | POA: Diagnosis not present

## 2019-08-22 DIAGNOSIS — G3184 Mild cognitive impairment, so stated: Secondary | ICD-10-CM | POA: Diagnosis not present

## 2019-08-22 DIAGNOSIS — G4700 Insomnia, unspecified: Secondary | ICD-10-CM | POA: Diagnosis not present

## 2019-08-22 DIAGNOSIS — M810 Age-related osteoporosis without current pathological fracture: Secondary | ICD-10-CM | POA: Diagnosis not present

## 2019-08-29 DIAGNOSIS — E782 Mixed hyperlipidemia: Secondary | ICD-10-CM | POA: Diagnosis not present

## 2019-08-29 DIAGNOSIS — I10 Essential (primary) hypertension: Secondary | ICD-10-CM | POA: Diagnosis not present

## 2019-08-29 DIAGNOSIS — F329 Major depressive disorder, single episode, unspecified: Secondary | ICD-10-CM | POA: Diagnosis not present

## 2019-08-29 DIAGNOSIS — G3184 Mild cognitive impairment, so stated: Secondary | ICD-10-CM | POA: Diagnosis not present

## 2019-08-29 DIAGNOSIS — M810 Age-related osteoporosis without current pathological fracture: Secondary | ICD-10-CM | POA: Diagnosis not present

## 2019-08-29 DIAGNOSIS — G4700 Insomnia, unspecified: Secondary | ICD-10-CM | POA: Diagnosis not present

## 2019-08-31 DIAGNOSIS — F329 Major depressive disorder, single episode, unspecified: Secondary | ICD-10-CM | POA: Diagnosis not present

## 2019-08-31 DIAGNOSIS — E782 Mixed hyperlipidemia: Secondary | ICD-10-CM | POA: Diagnosis not present

## 2019-08-31 DIAGNOSIS — I10 Essential (primary) hypertension: Secondary | ICD-10-CM | POA: Diagnosis not present

## 2019-08-31 DIAGNOSIS — G4700 Insomnia, unspecified: Secondary | ICD-10-CM | POA: Diagnosis not present

## 2019-08-31 DIAGNOSIS — G3184 Mild cognitive impairment, so stated: Secondary | ICD-10-CM | POA: Diagnosis not present

## 2019-08-31 DIAGNOSIS — M810 Age-related osteoporosis without current pathological fracture: Secondary | ICD-10-CM | POA: Diagnosis not present

## 2019-09-04 ENCOUNTER — Other Ambulatory Visit: Payer: Self-pay | Admitting: Neurology

## 2019-09-04 DIAGNOSIS — M810 Age-related osteoporosis without current pathological fracture: Secondary | ICD-10-CM | POA: Diagnosis not present

## 2019-09-04 DIAGNOSIS — G4700 Insomnia, unspecified: Secondary | ICD-10-CM | POA: Diagnosis not present

## 2019-09-04 DIAGNOSIS — E782 Mixed hyperlipidemia: Secondary | ICD-10-CM | POA: Diagnosis not present

## 2019-09-04 DIAGNOSIS — I10 Essential (primary) hypertension: Secondary | ICD-10-CM | POA: Diagnosis not present

## 2019-09-04 DIAGNOSIS — G3184 Mild cognitive impairment, so stated: Secondary | ICD-10-CM | POA: Diagnosis not present

## 2019-09-04 DIAGNOSIS — F329 Major depressive disorder, single episode, unspecified: Secondary | ICD-10-CM | POA: Diagnosis not present

## 2019-09-05 DIAGNOSIS — Z23 Encounter for immunization: Secondary | ICD-10-CM | POA: Diagnosis not present

## 2019-09-14 DIAGNOSIS — I10 Essential (primary) hypertension: Secondary | ICD-10-CM | POA: Diagnosis not present

## 2019-09-14 DIAGNOSIS — G4700 Insomnia, unspecified: Secondary | ICD-10-CM | POA: Diagnosis not present

## 2019-09-14 DIAGNOSIS — F329 Major depressive disorder, single episode, unspecified: Secondary | ICD-10-CM | POA: Diagnosis not present

## 2019-09-14 DIAGNOSIS — G3184 Mild cognitive impairment, so stated: Secondary | ICD-10-CM | POA: Diagnosis not present

## 2019-09-14 DIAGNOSIS — M810 Age-related osteoporosis without current pathological fracture: Secondary | ICD-10-CM | POA: Diagnosis not present

## 2019-09-14 DIAGNOSIS — E782 Mixed hyperlipidemia: Secondary | ICD-10-CM | POA: Diagnosis not present

## 2019-09-19 DIAGNOSIS — I10 Essential (primary) hypertension: Secondary | ICD-10-CM | POA: Diagnosis not present

## 2019-09-19 DIAGNOSIS — M810 Age-related osteoporosis without current pathological fracture: Secondary | ICD-10-CM | POA: Diagnosis not present

## 2019-09-19 DIAGNOSIS — Z9181 History of falling: Secondary | ICD-10-CM | POA: Diagnosis not present

## 2019-09-19 DIAGNOSIS — F17210 Nicotine dependence, cigarettes, uncomplicated: Secondary | ICD-10-CM | POA: Diagnosis not present

## 2019-09-19 DIAGNOSIS — E782 Mixed hyperlipidemia: Secondary | ICD-10-CM | POA: Diagnosis not present

## 2019-09-19 DIAGNOSIS — F329 Major depressive disorder, single episode, unspecified: Secondary | ICD-10-CM | POA: Diagnosis not present

## 2019-09-19 DIAGNOSIS — G4700 Insomnia, unspecified: Secondary | ICD-10-CM | POA: Diagnosis not present

## 2019-09-19 DIAGNOSIS — G3184 Mild cognitive impairment, so stated: Secondary | ICD-10-CM | POA: Diagnosis not present

## 2019-09-20 DIAGNOSIS — I10 Essential (primary) hypertension: Secondary | ICD-10-CM | POA: Diagnosis not present

## 2019-09-20 DIAGNOSIS — G3184 Mild cognitive impairment, so stated: Secondary | ICD-10-CM | POA: Diagnosis not present

## 2019-09-20 DIAGNOSIS — M810 Age-related osteoporosis without current pathological fracture: Secondary | ICD-10-CM | POA: Diagnosis not present

## 2019-09-20 DIAGNOSIS — E782 Mixed hyperlipidemia: Secondary | ICD-10-CM | POA: Diagnosis not present

## 2019-09-20 DIAGNOSIS — G4700 Insomnia, unspecified: Secondary | ICD-10-CM | POA: Diagnosis not present

## 2019-09-20 DIAGNOSIS — F329 Major depressive disorder, single episode, unspecified: Secondary | ICD-10-CM | POA: Diagnosis not present

## 2019-09-21 DIAGNOSIS — G4700 Insomnia, unspecified: Secondary | ICD-10-CM | POA: Diagnosis not present

## 2019-09-21 DIAGNOSIS — I10 Essential (primary) hypertension: Secondary | ICD-10-CM | POA: Diagnosis not present

## 2019-09-21 DIAGNOSIS — E782 Mixed hyperlipidemia: Secondary | ICD-10-CM | POA: Diagnosis not present

## 2019-09-21 DIAGNOSIS — G3184 Mild cognitive impairment, so stated: Secondary | ICD-10-CM | POA: Diagnosis not present

## 2019-09-21 DIAGNOSIS — F329 Major depressive disorder, single episode, unspecified: Secondary | ICD-10-CM | POA: Diagnosis not present

## 2019-09-21 DIAGNOSIS — M810 Age-related osteoporosis without current pathological fracture: Secondary | ICD-10-CM | POA: Diagnosis not present

## 2019-09-24 DIAGNOSIS — F329 Major depressive disorder, single episode, unspecified: Secondary | ICD-10-CM | POA: Diagnosis not present

## 2019-09-24 DIAGNOSIS — G3184 Mild cognitive impairment, so stated: Secondary | ICD-10-CM | POA: Diagnosis not present

## 2019-09-24 DIAGNOSIS — I10 Essential (primary) hypertension: Secondary | ICD-10-CM | POA: Diagnosis not present

## 2019-09-24 DIAGNOSIS — G4700 Insomnia, unspecified: Secondary | ICD-10-CM | POA: Diagnosis not present

## 2019-09-24 DIAGNOSIS — E782 Mixed hyperlipidemia: Secondary | ICD-10-CM | POA: Diagnosis not present

## 2019-09-24 DIAGNOSIS — M810 Age-related osteoporosis without current pathological fracture: Secondary | ICD-10-CM | POA: Diagnosis not present

## 2019-09-26 DIAGNOSIS — G3184 Mild cognitive impairment, so stated: Secondary | ICD-10-CM | POA: Diagnosis not present

## 2019-09-26 DIAGNOSIS — G4700 Insomnia, unspecified: Secondary | ICD-10-CM | POA: Diagnosis not present

## 2019-09-26 DIAGNOSIS — M810 Age-related osteoporosis without current pathological fracture: Secondary | ICD-10-CM | POA: Diagnosis not present

## 2019-09-26 DIAGNOSIS — E782 Mixed hyperlipidemia: Secondary | ICD-10-CM | POA: Diagnosis not present

## 2019-09-26 DIAGNOSIS — F329 Major depressive disorder, single episode, unspecified: Secondary | ICD-10-CM | POA: Diagnosis not present

## 2019-09-26 DIAGNOSIS — I10 Essential (primary) hypertension: Secondary | ICD-10-CM | POA: Diagnosis not present

## 2019-10-01 DIAGNOSIS — I10 Essential (primary) hypertension: Secondary | ICD-10-CM | POA: Diagnosis not present

## 2019-10-01 DIAGNOSIS — F329 Major depressive disorder, single episode, unspecified: Secondary | ICD-10-CM | POA: Diagnosis not present

## 2019-10-01 DIAGNOSIS — G4700 Insomnia, unspecified: Secondary | ICD-10-CM | POA: Diagnosis not present

## 2019-10-01 DIAGNOSIS — M810 Age-related osteoporosis without current pathological fracture: Secondary | ICD-10-CM | POA: Diagnosis not present

## 2019-10-01 DIAGNOSIS — E782 Mixed hyperlipidemia: Secondary | ICD-10-CM | POA: Diagnosis not present

## 2019-10-01 DIAGNOSIS — G3184 Mild cognitive impairment, so stated: Secondary | ICD-10-CM | POA: Diagnosis not present

## 2019-10-02 ENCOUNTER — Other Ambulatory Visit: Payer: Self-pay

## 2019-10-02 ENCOUNTER — Encounter (HOSPITAL_COMMUNITY): Payer: Self-pay

## 2019-10-02 ENCOUNTER — Emergency Department (HOSPITAL_COMMUNITY)
Admission: EM | Admit: 2019-10-02 | Discharge: 2019-10-02 | Discharge status: Against Medical Advice | Payer: Medicare Other | Attending: Emergency Medicine | Admitting: Emergency Medicine

## 2019-10-02 DIAGNOSIS — R1033 Periumbilical pain: Secondary | ICD-10-CM | POA: Diagnosis present

## 2019-10-02 DIAGNOSIS — Z5321 Procedure and treatment not carried out due to patient leaving prior to being seen by health care provider: Secondary | ICD-10-CM | POA: Insufficient documentation

## 2019-10-02 MED ORDER — SODIUM CHLORIDE 0.9% FLUSH: 3.0000 mL | Freq: Once | INTRAVENOUS | Status: DC

## 2019-10-02 NOTE — ED Notes (Signed)
Patient states she wanted to go home

## 2019-10-02 NOTE — ED Triage Notes (Signed)
Patient c/o intermittent mid abdominal pain, but worse in the past few days. Patient's daughter-in-law states that her HR was in the 50's at home and in triage HR-70. Patient has not had a BM in 3 days, but the one she had today was small and dark green.

## 2019-10-03 ENCOUNTER — Ambulatory Visit
Admission: RE | Admit: 2019-10-03 | Discharge: 2019-10-03 | Disposition: A | Discharge status: Home / Self Care | Payer: Medicare Other | Source: Ambulatory Visit | Attending: Family Medicine | Admitting: Family Medicine

## 2019-10-03 ENCOUNTER — Other Ambulatory Visit: Payer: Self-pay | Admitting: Family Medicine

## 2019-10-03 DIAGNOSIS — R1310 Dysphagia, unspecified: Secondary | ICD-10-CM

## 2019-10-03 DIAGNOSIS — R109 Unspecified abdominal pain: Secondary | ICD-10-CM | POA: Diagnosis not present

## 2019-10-03 DIAGNOSIS — F0390 Unspecified dementia without behavioral disturbance: Secondary | ICD-10-CM | POA: Diagnosis not present

## 2019-10-03 DIAGNOSIS — J449 Chronic obstructive pulmonary disease, unspecified: Secondary | ICD-10-CM | POA: Diagnosis not present

## 2019-10-05 ENCOUNTER — Other Ambulatory Visit: Payer: Self-pay | Admitting: *Deleted

## 2019-10-05 DIAGNOSIS — I10 Essential (primary) hypertension: Secondary | ICD-10-CM | POA: Diagnosis not present

## 2019-10-05 DIAGNOSIS — F329 Major depressive disorder, single episode, unspecified: Secondary | ICD-10-CM | POA: Diagnosis not present

## 2019-10-05 DIAGNOSIS — E782 Mixed hyperlipidemia: Secondary | ICD-10-CM | POA: Diagnosis not present

## 2019-10-05 DIAGNOSIS — G4700 Insomnia, unspecified: Secondary | ICD-10-CM | POA: Diagnosis not present

## 2019-10-05 DIAGNOSIS — G3184 Mild cognitive impairment, so stated: Secondary | ICD-10-CM | POA: Diagnosis not present

## 2019-10-05 DIAGNOSIS — M810 Age-related osteoporosis without current pathological fracture: Secondary | ICD-10-CM | POA: Diagnosis not present

## 2019-10-05 NOTE — Patient Outreach (Signed)
[facility]) Care Management  10/05/2019  MICHOL CAN 07-19-1932 [id number]    Telephone Assessment-Unsuccessful  RN initial outreach attempt unsuccessful. RN able to leave a HIPAA voice message requesting a call back. Will further engage at that time.   Will send outreach letter and scheduled another outreach call with in 4 business days.  [name], RN Care Management Coordinator [place] Office [phone]

## 2019-10-08 DIAGNOSIS — G4700 Insomnia, unspecified: Secondary | ICD-10-CM | POA: Diagnosis not present

## 2019-10-08 DIAGNOSIS — G3184 Mild cognitive impairment, so stated: Secondary | ICD-10-CM | POA: Diagnosis not present

## 2019-10-08 DIAGNOSIS — M810 Age-related osteoporosis without current pathological fracture: Secondary | ICD-10-CM | POA: Diagnosis not present

## 2019-10-08 DIAGNOSIS — E782 Mixed hyperlipidemia: Secondary | ICD-10-CM | POA: Diagnosis not present

## 2019-10-08 DIAGNOSIS — F329 Major depressive disorder, single episode, unspecified: Secondary | ICD-10-CM | POA: Diagnosis not present

## 2019-10-08 DIAGNOSIS — I10 Essential (primary) hypertension: Secondary | ICD-10-CM | POA: Diagnosis not present

## 2019-10-10 DIAGNOSIS — F329 Major depressive disorder, single episode, unspecified: Secondary | ICD-10-CM | POA: Diagnosis not present

## 2019-10-10 DIAGNOSIS — G3184 Mild cognitive impairment, so stated: Secondary | ICD-10-CM | POA: Diagnosis not present

## 2019-10-10 DIAGNOSIS — E782 Mixed hyperlipidemia: Secondary | ICD-10-CM | POA: Diagnosis not present

## 2019-10-10 DIAGNOSIS — M810 Age-related osteoporosis without current pathological fracture: Secondary | ICD-10-CM | POA: Diagnosis not present

## 2019-10-10 DIAGNOSIS — I10 Essential (primary) hypertension: Secondary | ICD-10-CM | POA: Diagnosis not present

## 2019-10-10 DIAGNOSIS — G4700 Insomnia, unspecified: Secondary | ICD-10-CM | POA: Diagnosis not present

## 2019-10-11 ENCOUNTER — Other Ambulatory Visit: Payer: Self-pay | Admitting: *Deleted

## 2019-10-11 NOTE — Patient Outreach (Signed)
[facility]) Care Management  10/11/2019  Brooke [name] 1932-08-05 [id number]    Telephone Assessment-Unsuccessful  RN attempted to contact pt on the present mobile number however son Brooke [name]) answered the phone. RN explained [facility] and the purpose for the call in trying to contact the pt or daughter concerning the recent referral. Son indicated he would contact the daughter and provider the contact information given for a return call. No other exchange of information today as RN will await a call back for pending services.  [name], RN Care Management Coordinator [place] Office [phone]

## 2019-10-15 ENCOUNTER — Other Ambulatory Visit: Payer: Self-pay | Admitting: *Deleted

## 2019-10-15 NOTE — Patient Outreach (Addendum)
[facility]) Care Management  10/15/2019  Brooke [name] 09-24-1932 [id number]    Telephone Assessment-Unsuccessful  RN attempted 3rd outreach call to daughter's cell and work however unsuccessful. RN able to leave HIPAA approved voice message to both numbers and currently awaiting a call back.  Will follow up once again over the next few weeks for pending services.  [name], RN Care Management Coordinator [place] Office [phone]

## 2019-10-18 ENCOUNTER — Other Ambulatory Visit: Payer: Self-pay | Admitting: *Deleted

## 2019-10-18 NOTE — Patient Outreach (Signed)
[name]) Care Management  10/18/2019  Brooke [name] [birth year] [id number]    Telephone Assessment-Successful  RN spoke with Brooke [name] (primary caregiver) concerning the recent referral. RN explained [facility] services and available program. Caregiver indicates since the time of the referral several events have occurred and [facility] is no longer needed at this time. States pt has [facility] involved who providers a DNP and RN to visit twice monthly along with availability for acute needs within 24 hr for a home visit. States they have arranged for delivery of all medications to come to the home and will address all the pt's needs at this time. States the pt's has [facility] for a CNA three days weekly under the pt's long-term care but it still requires some expense and inquired on resources. RN offered to send a list of agencies that provide such services however [facility] would not be a candidate to offer aide services at this time (declined). At this time no other needs presented as caregiver states she may need assistance with placing pt into a long term memory unit however that would be later on not at this time.   RN offer any other assistance and this was declined at this time however appreciative and grateful for the follow up calls. RN will notify pt's provider and close this case based upon the request from the primary caregiver with no other needs to be addressed at this time. RN again offered preventive measures for quarterly calls and this was also a decline.   Case will be closed.  [name], RN Care Management Coordinator [place] Office [phone]

## 2019-10-19 DIAGNOSIS — F329 Major depressive disorder, single episode, unspecified: Secondary | ICD-10-CM | POA: Diagnosis not present

## 2019-10-19 DIAGNOSIS — G3184 Mild cognitive impairment, so stated: Secondary | ICD-10-CM | POA: Diagnosis not present

## 2019-10-19 DIAGNOSIS — F17210 Nicotine dependence, cigarettes, uncomplicated: Secondary | ICD-10-CM | POA: Diagnosis not present

## 2019-10-19 DIAGNOSIS — M810 Age-related osteoporosis without current pathological fracture: Secondary | ICD-10-CM | POA: Diagnosis not present

## 2019-10-19 DIAGNOSIS — E782 Mixed hyperlipidemia: Secondary | ICD-10-CM | POA: Diagnosis not present

## 2019-10-19 DIAGNOSIS — G4700 Insomnia, unspecified: Secondary | ICD-10-CM | POA: Diagnosis not present

## 2019-10-19 DIAGNOSIS — Z9181 History of falling: Secondary | ICD-10-CM | POA: Diagnosis not present

## 2019-10-19 DIAGNOSIS — I10 Essential (primary) hypertension: Secondary | ICD-10-CM | POA: Diagnosis not present

## 2019-10-26 DIAGNOSIS — I10 Essential (primary) hypertension: Secondary | ICD-10-CM | POA: Diagnosis not present

## 2019-10-26 DIAGNOSIS — G3184 Mild cognitive impairment, so stated: Secondary | ICD-10-CM | POA: Diagnosis not present

## 2019-10-26 DIAGNOSIS — E782 Mixed hyperlipidemia: Secondary | ICD-10-CM | POA: Diagnosis not present

## 2019-10-26 DIAGNOSIS — F329 Major depressive disorder, single episode, unspecified: Secondary | ICD-10-CM | POA: Diagnosis not present

## 2019-10-26 DIAGNOSIS — G4700 Insomnia, unspecified: Secondary | ICD-10-CM | POA: Diagnosis not present

## 2019-10-26 DIAGNOSIS — M810 Age-related osteoporosis without current pathological fracture: Secondary | ICD-10-CM | POA: Diagnosis not present

## 2019-10-31 DIAGNOSIS — F329 Major depressive disorder, single episode, unspecified: Secondary | ICD-10-CM | POA: Diagnosis not present

## 2019-10-31 DIAGNOSIS — M810 Age-related osteoporosis without current pathological fracture: Secondary | ICD-10-CM | POA: Diagnosis not present

## 2019-10-31 DIAGNOSIS — G4700 Insomnia, unspecified: Secondary | ICD-10-CM | POA: Diagnosis not present

## 2019-10-31 DIAGNOSIS — I10 Essential (primary) hypertension: Secondary | ICD-10-CM | POA: Diagnosis not present

## 2019-10-31 DIAGNOSIS — G3184 Mild cognitive impairment, so stated: Secondary | ICD-10-CM | POA: Diagnosis not present

## 2019-10-31 DIAGNOSIS — E782 Mixed hyperlipidemia: Secondary | ICD-10-CM | POA: Diagnosis not present

## 2019-11-02 ENCOUNTER — Ambulatory Visit: Payer: Medicare Other | Admitting: *Deleted

## 2019-11-07 DIAGNOSIS — I10 Essential (primary) hypertension: Secondary | ICD-10-CM | POA: Diagnosis not present

## 2019-11-07 DIAGNOSIS — E782 Mixed hyperlipidemia: Secondary | ICD-10-CM | POA: Diagnosis not present

## 2019-11-07 DIAGNOSIS — G3184 Mild cognitive impairment, so stated: Secondary | ICD-10-CM | POA: Diagnosis not present

## 2019-11-07 DIAGNOSIS — G4700 Insomnia, unspecified: Secondary | ICD-10-CM | POA: Diagnosis not present

## 2019-11-07 DIAGNOSIS — M810 Age-related osteoporosis without current pathological fracture: Secondary | ICD-10-CM | POA: Diagnosis not present

## 2019-11-07 DIAGNOSIS — F329 Major depressive disorder, single episode, unspecified: Secondary | ICD-10-CM | POA: Diagnosis not present

## 2019-12-12 ENCOUNTER — Telehealth: Payer: Self-pay | Admitting: Adult Health

## 2019-12-12 NOTE — Telephone Encounter (Signed)
Called busy signal. 

## 2019-12-12 NOTE — Telephone Encounter (Signed)
Can you find out what they want the letter to state?

## 2019-12-12 NOTE — Telephone Encounter (Signed)
Patient husband called because they would like to get assistance with getting POA for the patient due to her alzheimer. Patient husband states they are needing 2 letters and have already received a letter from the patients PCP.   Please follow up.

## 2019-12-13 NOTE — Telephone Encounter (Signed)
Husband called back and wanted me to ask Dr. Rexene Alberts to see if he would write the letter for him as it is hard for him to come in with pt.

## 2019-12-13 NOTE — Telephone Encounter (Signed)
I saw the patient in June but it was Virtual. I would like to see the patient in the office to do a memory test and follow-up before writing letter.

## 2019-12-13 NOTE — Telephone Encounter (Signed)
Again, as Brooke [name] indicated, a appointment should be made for an office evaluation, in addition, I would suggest that [facility] consider a referral to a neuropsychologist for more in depth memory eval if needed.

## 2019-12-13 NOTE — Telephone Encounter (Signed)
Spoke to husband of pt.  Relayed that per MM/NP last seen in 6/20 as VV. , would like to see in office for MMSE. Made appt 01-01-20 at 0830.  He will make it happen.

## 2019-12-13 NOTE — Telephone Encounter (Signed)
I called husband of pt.  He stated that letter stating that she has dementia and is not able to handle her personal and business affairs.

## 2019-12-13 NOTE — Telephone Encounter (Signed)
I called pts husband and relayed per Dr. Guadelupe Sabin note as well as possible needing of neuropsych eval if needed.  He will keep appt scheduled and I will try and look out for a earlier appt if one becomes available 9-1 is best time.  He was appreciative.

## 2019-12-26 ENCOUNTER — Other Ambulatory Visit: Payer: Self-pay | Admitting: Neurology

## 2020-01-01 ENCOUNTER — Ambulatory Visit: Payer: Self-pay | Admitting: Adult Health

## 2020-01-11 ENCOUNTER — Telehealth: Payer: Self-pay

## 2020-01-11 NOTE — Telephone Encounter (Signed)
Left message reference referral to PREP from [facility]. Requested call back to discuss.

## 2020-01-17 DIAGNOSIS — G4700 Insomnia, unspecified: Secondary | ICD-10-CM | POA: Diagnosis not present

## 2020-01-17 DIAGNOSIS — Z0000 Encounter for general adult medical examination without abnormal findings: Secondary | ICD-10-CM | POA: Diagnosis not present

## 2020-01-17 DIAGNOSIS — E782 Mixed hyperlipidemia: Secondary | ICD-10-CM | POA: Diagnosis not present

## 2020-01-17 DIAGNOSIS — Z7189 Other specified counseling: Secondary | ICD-10-CM | POA: Diagnosis not present

## 2020-01-17 DIAGNOSIS — F0390 Unspecified dementia without behavioral disturbance: Secondary | ICD-10-CM | POA: Diagnosis not present

## 2020-01-17 DIAGNOSIS — M810 Age-related osteoporosis without current pathological fracture: Secondary | ICD-10-CM | POA: Diagnosis not present

## 2020-01-17 DIAGNOSIS — I10 Essential (primary) hypertension: Secondary | ICD-10-CM | POA: Diagnosis not present

## 2020-01-18 ENCOUNTER — Telehealth: Payer: Self-pay

## 2020-01-18 NOTE — Telephone Encounter (Signed)
Left message requesting call back to check interest in PREP program. Received referral from [facility]. No call back from 1st message on 01/11/2020

## 2020-02-20 DIAGNOSIS — M810 Age-related osteoporosis without current pathological fracture: Secondary | ICD-10-CM | POA: Diagnosis not present

## 2020-03-19 DIAGNOSIS — R319 Hematuria, unspecified: Secondary | ICD-10-CM | POA: Diagnosis not present

## 2020-03-19 DIAGNOSIS — R4182 Altered mental status, unspecified: Secondary | ICD-10-CM | POA: Diagnosis not present

## 2020-03-20 ENCOUNTER — Other Ambulatory Visit: Payer: Self-pay | Admitting: Adult Health

## 2020-03-21 DIAGNOSIS — N281 Cyst of kidney, acquired: Secondary | ICD-10-CM | POA: Diagnosis not present

## 2020-03-21 DIAGNOSIS — N1330 Unspecified hydronephrosis: Secondary | ICD-10-CM | POA: Diagnosis not present

## 2020-03-24 ENCOUNTER — Other Ambulatory Visit: Payer: Self-pay | Admitting: Nurse Practitioner

## 2020-03-24 ENCOUNTER — Other Ambulatory Visit: Payer: Medicare Other

## 2020-03-24 DIAGNOSIS — R109 Unspecified abdominal pain: Secondary | ICD-10-CM

## 2020-04-04 ENCOUNTER — Other Ambulatory Visit: Payer: Self-pay | Admitting: Nurse Practitioner

## 2020-04-09 ENCOUNTER — Ambulatory Visit
Admission: RE | Admit: 2020-04-09 | Discharge: 2020-04-09 | Disposition: A | Discharge status: Home / Self Care | Payer: Medicare Other | Source: Ambulatory Visit | Attending: Nurse Practitioner | Admitting: Nurse Practitioner

## 2020-04-09 ENCOUNTER — Other Ambulatory Visit: Payer: Self-pay

## 2020-04-09 DIAGNOSIS — R109 Unspecified abdominal pain: Secondary | ICD-10-CM

## 2020-04-09 DIAGNOSIS — N281 Cyst of kidney, acquired: Secondary | ICD-10-CM | POA: Diagnosis not present

## 2020-04-09 MED ORDER — IOPAMIDOL (ISOVUE-300) INJECTION 61%
100.0000 mL | Freq: Once | INTRAVENOUS | Status: AC | PRN
  Administered 2020-04-09: 100 mL via INTRAVENOUS

## 2020-06-05 ENCOUNTER — Other Ambulatory Visit: Payer: Self-pay | Admitting: Adult Health

## 2020-06-18 ENCOUNTER — Ambulatory Visit (INDEPENDENT_AMBULATORY_CARE_PROVIDER_SITE_OTHER): Payer: Medicare Other | Admitting: Adult Health

## 2020-06-18 ENCOUNTER — Other Ambulatory Visit: Payer: Self-pay

## 2020-06-18 ENCOUNTER — Encounter: Payer: Self-pay | Admitting: Adult Health

## 2020-06-18 VITALS — BP 164/79 | HR 66 | Ht 62.0 in | Wt 113.0 lb

## 2020-06-18 DIAGNOSIS — G301 Alzheimer's disease with late onset: Secondary | ICD-10-CM

## 2020-06-18 DIAGNOSIS — F0280 Dementia in other diseases classified elsewhere without behavioral disturbance: Secondary | ICD-10-CM

## 2020-06-18 NOTE — Progress Notes (Signed)
PATIENT: Brooke [name] DOB: 1932-09-02  REASON FOR VISIT: follow up HISTORY FROM: patient  HISTORY OF PRESENT ILLNESS: Today 06/18/20:  Brooke [name] is an 86 year old female with a history of memory disturbance.  She returns today for follow-up.  She is here today with her husband.  He has noticed some changes in her memory.  He continues to have to help with ADLs.  He helps her with all meals.  He reports that she does have some sundowning typically between 4 and 9 PM.  He states as of now this is manageable.  She continues on Aricept and Namenda.  HISTORY 06/05/19 Brooke [name] is an 86 year old female with a history of memory disturbance.  She was unable to do a virtual visit so we converted to a telephone visit.  I spoke with her husband primarily.  Her memory score at the last visit was 18.  He states that she requires assistance with all ADLs.  He has an aide that comes in twice a week.  She no longer does any cooking.   REVIEW OF SYSTEMS: Out of a complete 14 system review of symptoms, the patient complains only of the following symptoms, and all other reviewed systems are negative.  See HPI  ALLERGIES: Allergies  Allergen Reactions  . Oxycontin [Oxycodone Hcl] Other (See Comments)    Hallucinations     HOME MEDICATIONS: Outpatient Medications Prior to Visit  Medication Sig Dispense Refill  . denosumab (PROLIA) 60 MG/ML SOSY injection Inject 60 mg into the skin every 6 (six) months.    . donepezil (ARICEPT) 10 MG tablet TAKE 1 TABLET BY MOUTH EVERY DAY AT BEDTIME 90 tablet 3  . memantine (NAMENDA) 10 MG tablet TAKE 1 TABLET BY MOUTH TWICE A DAY 180 tablet 1  . Methylfol-Algae-B12-Acetylcyst (CEREFOLIN NAC) 6-90.314-2-600 MG TABS TAKE 1 TABLET BY MOUTH EVERY DAY 90 tablet 0  . metoprolol tartrate (LOPRESSOR) 25 MG tablet Take 25 mg by mouth 2 (two) times daily.   5  . perphenazine-amitriptyline (ETRAFON/TRIAVIL) 2-10 MG TABS tablet Take 1 tablet by mouth 2 (two) times  daily.    . simvastatin (ZOCOR) 20 MG tablet Take 20 mg by mouth at bedtime.     . triamcinolone cream (KENALOG) 0.5 % Apply 1 application topically 2 (two) times daily.     No facility-administered medications prior to visit.    PAST MEDICAL HISTORY: Past Medical History:  Diagnosis Date  . Arthritis    hands   . AVM (arteriovenous malformation)   . Cameron lesion, acute   . Cough   . Diverticulosis   . Diverticulosis   . GERD (gastroesophageal reflux disease)   . H/O: GI bleed   . H/O: hematuria   . Hiatal hernia   . History of colon polyps   . History of hypokalemia   . Hyperlipidemia   . Hypertension   . Internal hemorrhoid   . Iron deficiency anemia   . Osteoporosis   . Paraesophageal hernia   . Paraesophageal hernia    history of  . Praesophageal hiatal hernia - giant, irreducible 10/20/2011    PAST SURGICAL HISTORY: Past Surgical History:  Procedure Laterality Date  . CESAREAN SECTION  01/1965  . colonscopy    . DILATION AND CURETTAGE OF UTERUS    . HERNIA REPAIR  12/2011  . LAPAROSCOPIC NISSEN FUNDOPLICATION  1/63/8453   Procedure: LAPAROSCOPIC NISSEN FUNDOPLICATION;  Surgeon: Adin Hector, MD;  Location: [facility];  Service: General;  Laterality: N/A;  laparoscopic paraesphogeal hernia repair and fundoplication  . POLYPECTOMY      FAMILY HISTORY: Family History  Problem Relation Age of Onset  . Prostate cancer Brother   . Cancer Brother        prostate, and skin  . Colon cancer Neg Hx     SOCIAL HISTORY: Social History   Socioeconomic History  . Marital status: Married    Spouse name: Not on file  . Number of children: 1  . Years of education: Not on file  . Highest education level: Not on file  Occupational History  . Occupation: Occupational hygienist: [employer]    Comment: [employer]  Tobacco Use  . Smoking status: Never Smoker  . Smokeless tobacco: Never Used  Vaping Use  . Vaping Use: Never used  Substance and Sexual  Activity  . Alcohol use: Yes    Alcohol/week: 18.0 standard drinks    Types: 18 Glasses of wine per week    Comment: 1 glasses daily  . Drug use: No  . Sexual activity: Never  Other Topics Concern  . Not on file  Social History Narrative   2-3 cups of coffee a day    Education-12   Lives with Husband- Bud   Social Determinants of Health   Financial Resource Strain:   . Difficulty of Paying Living Expenses:   Food Insecurity:   . Worried About Charity fundraiser in the Last Year:   . Arboriculturist in the Last Year:   Transportation Needs:   . Film/video editor (Medical):   Marland Kitchen Lack of Transportation (Non-Medical):   Physical Activity:   . Days of Exercise per Week:   . Minutes of Exercise per Session:   Stress:   . Feeling of Stress :   Social Connections:   . Frequency of Communication with Friends and Family:   . Frequency of Social Gatherings with Friends and Family:   . Attends Religious Services:   . Active Member of Clubs or Organizations:   . Attends Archivist Meetings:   Marland Kitchen Marital Status:   Intimate Partner Violence:   . Fear of Current or Ex-Partner:   . Emotionally Abused:   Marland Kitchen Physically Abused:   . Sexually Abused:       PHYSICAL EXAM  Vitals:   06/18/20 1022  BP: (!) 164/79  Pulse: 66  Weight: 113 lb (51.3 kg)  Height: 5\' 2"  (1.575 m)   Body mass index is 20.67 kg/m.  Generalized: Well developed, in no acute distress   Neurological examination  Mentation: Alert. Follows all commands speech and language fluent Cranial nerve II-XII: Pupils were equal round reactive to light. Extraocular movements were full, visual field were full on confrontational test.  Head turning and shoulder shrug  were normal and symmetric. Motor: The motor testing reveals 5 over 5 strength of all 4 extremities. Good symmetric motor tone is noted throughout.  Sensory: Sensory testing is intact to soft touch on all 4 extremities. No evidence of extinction  is noted.  Coordination: Cerebellar testing reveals good finger-nose-finger and heel-to-shin bilaterally.  Gait and station: Gait is normal.   DIAGNOSTIC DATA (LABS, IMAGING, TESTING) - I reviewed patient records, labs, notes, testing and imaging myself where available.  Lab Results  Component Value Date   WBC 10.9 (H) 01/04/2019   HGB 14.3 01/04/2019   HCT 42.0 01/04/2019   MCV 96.0 01/04/2019   PLT 196 01/04/2019  Component Value Date/Time   NA 141 01/04/2019 0218   K 3.7 01/04/2019 0218   CL 105 01/04/2019 0218   CO2 29 01/21/2012 0435   GLUCOSE 138 (H) 01/04/2019 0218   BUN 15 01/04/2019 0218   CREATININE 0.70 01/04/2019 0218   CALCIUM 8.1 (L) 01/21/2012 0435   PROT 6.4 01/17/2012 0733   ALBUMIN 3.8 04/18/2011 2001   AST 42 (H) 04/18/2011 2001   ALT 27 04/18/2011 2001   ALKPHOS 80 04/18/2011 2001   BILITOT 0.7 04/18/2011 2001   GFRNONAA 47 (L) 01/21/2012 0435   GFRAA 54 (L) 01/21/2012 0435   Lab Results  Component Value Date   CHOL 97 01/17/2012   HDL 35 (L) 01/17/2012   LDLCALC 44 01/17/2012   TRIG 91 01/17/2012   CHOLHDL 2.8 01/17/2012   Lab Results  Component Value Date   HGBA1C (H) 04/19/2011    6.1 (NOTE)                                                                       According to the ADA Clinical Practice Recommendations for 2011, when HbA1c is used as a screening test:   >=6.5%   Diagnostic of Diabetes Mellitus           (if abnormal result  is confirmed)  5.7-6.4%   Increased risk of developing Diabetes Mellitus  References:Diagnosis and Classification of Diabetes Mellitus,Diabetes TGYB,6389,37(DSKAJ 1):S62-S69 and Standards of Medical Care in         Diabetes - 2011,Diabetes Care,2011,34  (Suppl 1):S11-S61.   Lab Results  Component Value Date   VITAMINB12 640 01/18/2012   Lab Results  Component Value Date   TSH 1.928 01/17/2012      ASSESSMENT AND PLAN 86 y.o. year old female  has a past medical history of Arthritis, AVM  (arteriovenous malformation), Lysbeth Galas lesion, acute, Cough, Diverticulosis, Diverticulosis, GERD (gastroesophageal reflux disease), H/O: GI bleed, H/O: hematuria, Hiatal hernia, History of colon polyps, History of hypokalemia, Hyperlipidemia, Hypertension, Internal hemorrhoid, Iron deficiency anemia, Osteoporosis, Paraesophageal hernia, Paraesophageal hernia, and Praesophageal hiatal hernia - giant, irreducible (10/20/2011). here with :  Late onset Alzheimer's disease   Previous MMSE 10 out of 30 MMSE not completed today  Discussed staying on Aricept and Namenda.  Patient's husband would like to continue these now.  Advised that if symptoms worsen or she develops new symptoms they should let us know  Follow-up in 6 months or sooner if needed   I spent 20 minutes of face-to-face and non-face-to-face time with patient.  This included previsit chart review, lab review, study review, order entry, electronic health record documentation, patient education.  Ward Givens, MSN, NP-C 06/18/2020, 10:21 AM [facility] [address], [place], [place] [address] [phone]

## 2020-06-18 NOTE — Patient Instructions (Signed)
Your Plan:  Continue Aricept and namenda for now. Can consider stopping these if needed.  If your symptoms worsen or you develop new symptoms please let us know.    Thank you for coming to see [place] at [facility]. I hope we have been able to provide you high quality care today.  You may receive a patient satisfaction survey over the next few weeks. We would appreciate your feedback and comments so that we may continue to improve ourselves and the health of our patients.

## 2020-07-17 DIAGNOSIS — F0390 Unspecified dementia without behavioral disturbance: Secondary | ICD-10-CM | POA: Diagnosis not present

## 2020-07-17 DIAGNOSIS — E46 Unspecified protein-calorie malnutrition: Secondary | ICD-10-CM | POA: Diagnosis not present

## 2020-08-21 DIAGNOSIS — M810 Age-related osteoporosis without current pathological fracture: Secondary | ICD-10-CM | POA: Diagnosis not present

## 2020-09-11 DIAGNOSIS — Z9181 History of falling: Secondary | ICD-10-CM | POA: Diagnosis not present

## 2020-09-11 DIAGNOSIS — R270 Ataxia, unspecified: Secondary | ICD-10-CM | POA: Diagnosis not present

## 2020-09-11 DIAGNOSIS — F0390 Unspecified dementia without behavioral disturbance: Secondary | ICD-10-CM | POA: Diagnosis not present

## 2020-09-11 DIAGNOSIS — I10 Essential (primary) hypertension: Secondary | ICD-10-CM | POA: Diagnosis not present

## 2020-09-11 DIAGNOSIS — I700 Atherosclerosis of aorta: Secondary | ICD-10-CM | POA: Diagnosis not present

## 2020-09-11 DIAGNOSIS — R1310 Dysphagia, unspecified: Secondary | ICD-10-CM | POA: Diagnosis not present

## 2020-09-11 DIAGNOSIS — E782 Mixed hyperlipidemia: Secondary | ICD-10-CM | POA: Diagnosis not present

## 2020-09-16 DIAGNOSIS — R270 Ataxia, unspecified: Secondary | ICD-10-CM | POA: Diagnosis not present

## 2020-09-16 DIAGNOSIS — I10 Essential (primary) hypertension: Secondary | ICD-10-CM | POA: Diagnosis not present

## 2020-09-16 DIAGNOSIS — E782 Mixed hyperlipidemia: Secondary | ICD-10-CM | POA: Diagnosis not present

## 2020-09-16 DIAGNOSIS — I700 Atherosclerosis of aorta: Secondary | ICD-10-CM | POA: Diagnosis not present

## 2020-09-16 DIAGNOSIS — F0390 Unspecified dementia without behavioral disturbance: Secondary | ICD-10-CM | POA: Diagnosis not present

## 2020-09-16 DIAGNOSIS — R1310 Dysphagia, unspecified: Secondary | ICD-10-CM | POA: Diagnosis not present

## 2020-09-20 DIAGNOSIS — E782 Mixed hyperlipidemia: Secondary | ICD-10-CM | POA: Diagnosis not present

## 2020-09-20 DIAGNOSIS — R270 Ataxia, unspecified: Secondary | ICD-10-CM | POA: Diagnosis not present

## 2020-09-20 DIAGNOSIS — I10 Essential (primary) hypertension: Secondary | ICD-10-CM | POA: Diagnosis not present

## 2020-09-20 DIAGNOSIS — R1310 Dysphagia, unspecified: Secondary | ICD-10-CM | POA: Diagnosis not present

## 2020-09-20 DIAGNOSIS — F0390 Unspecified dementia without behavioral disturbance: Secondary | ICD-10-CM | POA: Diagnosis not present

## 2020-09-20 DIAGNOSIS — I700 Atherosclerosis of aorta: Secondary | ICD-10-CM | POA: Diagnosis not present

## 2020-09-22 DIAGNOSIS — Z23 Encounter for immunization: Secondary | ICD-10-CM | POA: Diagnosis not present

## 2020-09-26 DIAGNOSIS — E782 Mixed hyperlipidemia: Secondary | ICD-10-CM | POA: Diagnosis not present

## 2020-09-26 DIAGNOSIS — I700 Atherosclerosis of aorta: Secondary | ICD-10-CM | POA: Diagnosis not present

## 2020-09-26 DIAGNOSIS — I10 Essential (primary) hypertension: Secondary | ICD-10-CM | POA: Diagnosis not present

## 2020-09-26 DIAGNOSIS — F0390 Unspecified dementia without behavioral disturbance: Secondary | ICD-10-CM | POA: Diagnosis not present

## 2020-09-26 DIAGNOSIS — R1310 Dysphagia, unspecified: Secondary | ICD-10-CM | POA: Diagnosis not present

## 2020-09-26 DIAGNOSIS — R270 Ataxia, unspecified: Secondary | ICD-10-CM | POA: Diagnosis not present

## 2020-10-03 DIAGNOSIS — E782 Mixed hyperlipidemia: Secondary | ICD-10-CM | POA: Diagnosis not present

## 2020-10-03 DIAGNOSIS — I10 Essential (primary) hypertension: Secondary | ICD-10-CM | POA: Diagnosis not present

## 2020-10-03 DIAGNOSIS — R1310 Dysphagia, unspecified: Secondary | ICD-10-CM | POA: Diagnosis not present

## 2020-10-03 DIAGNOSIS — R270 Ataxia, unspecified: Secondary | ICD-10-CM | POA: Diagnosis not present

## 2020-10-03 DIAGNOSIS — F0390 Unspecified dementia without behavioral disturbance: Secondary | ICD-10-CM | POA: Diagnosis not present

## 2020-10-03 DIAGNOSIS — I700 Atherosclerosis of aorta: Secondary | ICD-10-CM | POA: Diagnosis not present

## 2020-10-09 DIAGNOSIS — R1310 Dysphagia, unspecified: Secondary | ICD-10-CM | POA: Diagnosis not present

## 2020-10-09 DIAGNOSIS — I10 Essential (primary) hypertension: Secondary | ICD-10-CM | POA: Diagnosis not present

## 2020-10-09 DIAGNOSIS — F0390 Unspecified dementia without behavioral disturbance: Secondary | ICD-10-CM | POA: Diagnosis not present

## 2020-10-09 DIAGNOSIS — R270 Ataxia, unspecified: Secondary | ICD-10-CM | POA: Diagnosis not present

## 2020-10-09 DIAGNOSIS — I700 Atherosclerosis of aorta: Secondary | ICD-10-CM | POA: Diagnosis not present

## 2020-10-09 DIAGNOSIS — E782 Mixed hyperlipidemia: Secondary | ICD-10-CM | POA: Diagnosis not present

## 2020-10-11 DIAGNOSIS — I700 Atherosclerosis of aorta: Secondary | ICD-10-CM | POA: Diagnosis not present

## 2020-10-11 DIAGNOSIS — F0390 Unspecified dementia without behavioral disturbance: Secondary | ICD-10-CM | POA: Diagnosis not present

## 2020-10-11 DIAGNOSIS — Z9181 History of falling: Secondary | ICD-10-CM | POA: Diagnosis not present

## 2020-10-11 DIAGNOSIS — R1310 Dysphagia, unspecified: Secondary | ICD-10-CM | POA: Diagnosis not present

## 2020-10-11 DIAGNOSIS — R270 Ataxia, unspecified: Secondary | ICD-10-CM | POA: Diagnosis not present

## 2020-10-11 DIAGNOSIS — I10 Essential (primary) hypertension: Secondary | ICD-10-CM | POA: Diagnosis not present

## 2020-10-11 DIAGNOSIS — E782 Mixed hyperlipidemia: Secondary | ICD-10-CM | POA: Diagnosis not present

## 2020-10-17 DIAGNOSIS — R270 Ataxia, unspecified: Secondary | ICD-10-CM | POA: Diagnosis not present

## 2020-10-17 DIAGNOSIS — R1310 Dysphagia, unspecified: Secondary | ICD-10-CM | POA: Diagnosis not present

## 2020-10-17 DIAGNOSIS — I10 Essential (primary) hypertension: Secondary | ICD-10-CM | POA: Diagnosis not present

## 2020-10-17 DIAGNOSIS — E782 Mixed hyperlipidemia: Secondary | ICD-10-CM | POA: Diagnosis not present

## 2020-10-17 DIAGNOSIS — I700 Atherosclerosis of aorta: Secondary | ICD-10-CM | POA: Diagnosis not present

## 2020-10-17 DIAGNOSIS — F0390 Unspecified dementia without behavioral disturbance: Secondary | ICD-10-CM | POA: Diagnosis not present

## 2020-10-21 DIAGNOSIS — K219 Gastro-esophageal reflux disease without esophagitis: Secondary | ICD-10-CM | POA: Diagnosis not present

## 2020-10-21 DIAGNOSIS — N858 Other specified noninflammatory disorders of uterus: Secondary | ICD-10-CM | POA: Diagnosis not present

## 2020-10-21 DIAGNOSIS — M1990 Unspecified osteoarthritis, unspecified site: Secondary | ICD-10-CM | POA: Diagnosis not present

## 2020-10-21 DIAGNOSIS — K7689 Other specified diseases of liver: Secondary | ICD-10-CM | POA: Diagnosis not present

## 2020-10-21 DIAGNOSIS — M810 Age-related osteoporosis without current pathological fracture: Secondary | ICD-10-CM | POA: Diagnosis not present

## 2020-10-21 DIAGNOSIS — R634 Abnormal weight loss: Secondary | ICD-10-CM | POA: Diagnosis not present

## 2020-10-21 DIAGNOSIS — I10 Essential (primary) hypertension: Secondary | ICD-10-CM | POA: Diagnosis not present

## 2020-10-21 DIAGNOSIS — G301 Alzheimer's disease with late onset: Secondary | ICD-10-CM | POA: Diagnosis not present

## 2020-10-21 DIAGNOSIS — F0280 Dementia in other diseases classified elsewhere without behavioral disturbance: Secondary | ICD-10-CM | POA: Diagnosis not present

## 2020-10-21 DIAGNOSIS — E440 Moderate protein-calorie malnutrition: Secondary | ICD-10-CM | POA: Diagnosis not present

## 2020-10-21 DIAGNOSIS — R1310 Dysphagia, unspecified: Secondary | ICD-10-CM | POA: Diagnosis not present

## 2020-10-22 DIAGNOSIS — I700 Atherosclerosis of aorta: Secondary | ICD-10-CM | POA: Diagnosis not present

## 2020-10-22 DIAGNOSIS — E782 Mixed hyperlipidemia: Secondary | ICD-10-CM | POA: Diagnosis not present

## 2020-10-22 DIAGNOSIS — R1310 Dysphagia, unspecified: Secondary | ICD-10-CM | POA: Diagnosis not present

## 2020-10-22 DIAGNOSIS — R270 Ataxia, unspecified: Secondary | ICD-10-CM | POA: Diagnosis not present

## 2020-10-22 DIAGNOSIS — I10 Essential (primary) hypertension: Secondary | ICD-10-CM | POA: Diagnosis not present

## 2020-10-22 DIAGNOSIS — F0390 Unspecified dementia without behavioral disturbance: Secondary | ICD-10-CM | POA: Diagnosis not present

## 2020-10-23 DIAGNOSIS — N858 Other specified noninflammatory disorders of uterus: Secondary | ICD-10-CM | POA: Diagnosis not present

## 2020-10-23 DIAGNOSIS — E440 Moderate protein-calorie malnutrition: Secondary | ICD-10-CM | POA: Diagnosis not present

## 2020-10-23 DIAGNOSIS — R1310 Dysphagia, unspecified: Secondary | ICD-10-CM | POA: Diagnosis not present

## 2020-10-23 DIAGNOSIS — G301 Alzheimer's disease with late onset: Secondary | ICD-10-CM | POA: Diagnosis not present

## 2020-10-23 DIAGNOSIS — K7689 Other specified diseases of liver: Secondary | ICD-10-CM | POA: Diagnosis not present

## 2020-10-23 DIAGNOSIS — R634 Abnormal weight loss: Secondary | ICD-10-CM | POA: Diagnosis not present

## 2020-10-27 DIAGNOSIS — E440 Moderate protein-calorie malnutrition: Secondary | ICD-10-CM | POA: Diagnosis not present

## 2020-10-27 DIAGNOSIS — K219 Gastro-esophageal reflux disease without esophagitis: Secondary | ICD-10-CM | POA: Diagnosis not present

## 2020-10-27 DIAGNOSIS — I10 Essential (primary) hypertension: Secondary | ICD-10-CM | POA: Diagnosis not present

## 2020-10-27 DIAGNOSIS — G301 Alzheimer's disease with late onset: Secondary | ICD-10-CM | POA: Diagnosis not present

## 2020-10-27 DIAGNOSIS — K7689 Other specified diseases of liver: Secondary | ICD-10-CM | POA: Diagnosis not present

## 2020-10-27 DIAGNOSIS — F0280 Dementia in other diseases classified elsewhere without behavioral disturbance: Secondary | ICD-10-CM | POA: Diagnosis not present

## 2020-10-27 DIAGNOSIS — M1990 Unspecified osteoarthritis, unspecified site: Secondary | ICD-10-CM | POA: Diagnosis not present

## 2020-10-27 DIAGNOSIS — R1310 Dysphagia, unspecified: Secondary | ICD-10-CM | POA: Diagnosis not present

## 2020-10-27 DIAGNOSIS — R634 Abnormal weight loss: Secondary | ICD-10-CM | POA: Diagnosis not present

## 2020-10-27 DIAGNOSIS — N858 Other specified noninflammatory disorders of uterus: Secondary | ICD-10-CM | POA: Diagnosis not present

## 2020-10-27 DIAGNOSIS — M810 Age-related osteoporosis without current pathological fracture: Secondary | ICD-10-CM | POA: Diagnosis not present

## 2020-10-28 ENCOUNTER — Other Ambulatory Visit: Payer: Self-pay | Admitting: Adult Health

## 2020-11-03 DIAGNOSIS — G301 Alzheimer's disease with late onset: Secondary | ICD-10-CM | POA: Diagnosis not present

## 2020-11-03 DIAGNOSIS — R1310 Dysphagia, unspecified: Secondary | ICD-10-CM | POA: Diagnosis not present

## 2020-11-03 DIAGNOSIS — E440 Moderate protein-calorie malnutrition: Secondary | ICD-10-CM | POA: Diagnosis not present

## 2020-11-03 DIAGNOSIS — N858 Other specified noninflammatory disorders of uterus: Secondary | ICD-10-CM | POA: Diagnosis not present

## 2020-11-03 DIAGNOSIS — R634 Abnormal weight loss: Secondary | ICD-10-CM | POA: Diagnosis not present

## 2020-11-03 DIAGNOSIS — K7689 Other specified diseases of liver: Secondary | ICD-10-CM | POA: Diagnosis not present

## 2020-11-08 DIAGNOSIS — N858 Other specified noninflammatory disorders of uterus: Secondary | ICD-10-CM | POA: Diagnosis not present

## 2020-11-08 DIAGNOSIS — G301 Alzheimer's disease with late onset: Secondary | ICD-10-CM | POA: Diagnosis not present

## 2020-11-08 DIAGNOSIS — K7689 Other specified diseases of liver: Secondary | ICD-10-CM | POA: Diagnosis not present

## 2020-11-08 DIAGNOSIS — R634 Abnormal weight loss: Secondary | ICD-10-CM | POA: Diagnosis not present

## 2020-11-08 DIAGNOSIS — E440 Moderate protein-calorie malnutrition: Secondary | ICD-10-CM | POA: Diagnosis not present

## 2020-11-08 DIAGNOSIS — R1310 Dysphagia, unspecified: Secondary | ICD-10-CM | POA: Diagnosis not present

## 2020-11-10 DIAGNOSIS — K7689 Other specified diseases of liver: Secondary | ICD-10-CM | POA: Diagnosis not present

## 2020-11-10 DIAGNOSIS — R1310 Dysphagia, unspecified: Secondary | ICD-10-CM | POA: Diagnosis not present

## 2020-11-10 DIAGNOSIS — G301 Alzheimer's disease with late onset: Secondary | ICD-10-CM | POA: Diagnosis not present

## 2020-11-10 DIAGNOSIS — N858 Other specified noninflammatory disorders of uterus: Secondary | ICD-10-CM | POA: Diagnosis not present

## 2020-11-10 DIAGNOSIS — R634 Abnormal weight loss: Secondary | ICD-10-CM | POA: Diagnosis not present

## 2020-11-10 DIAGNOSIS — E440 Moderate protein-calorie malnutrition: Secondary | ICD-10-CM | POA: Diagnosis not present

## 2020-11-17 DIAGNOSIS — G301 Alzheimer's disease with late onset: Secondary | ICD-10-CM | POA: Diagnosis not present

## 2020-11-17 DIAGNOSIS — R634 Abnormal weight loss: Secondary | ICD-10-CM | POA: Diagnosis not present

## 2020-11-17 DIAGNOSIS — N858 Other specified noninflammatory disorders of uterus: Secondary | ICD-10-CM | POA: Diagnosis not present

## 2020-11-17 DIAGNOSIS — E440 Moderate protein-calorie malnutrition: Secondary | ICD-10-CM | POA: Diagnosis not present

## 2020-11-17 DIAGNOSIS — K7689 Other specified diseases of liver: Secondary | ICD-10-CM | POA: Diagnosis not present

## 2020-11-17 DIAGNOSIS — R1310 Dysphagia, unspecified: Secondary | ICD-10-CM | POA: Diagnosis not present

## 2020-11-21 DIAGNOSIS — G301 Alzheimer's disease with late onset: Secondary | ICD-10-CM | POA: Diagnosis not present

## 2020-11-21 DIAGNOSIS — N858 Other specified noninflammatory disorders of uterus: Secondary | ICD-10-CM | POA: Diagnosis not present

## 2020-11-21 DIAGNOSIS — R634 Abnormal weight loss: Secondary | ICD-10-CM | POA: Diagnosis not present

## 2020-11-21 DIAGNOSIS — E440 Moderate protein-calorie malnutrition: Secondary | ICD-10-CM | POA: Diagnosis not present

## 2020-11-21 DIAGNOSIS — K7689 Other specified diseases of liver: Secondary | ICD-10-CM | POA: Diagnosis not present

## 2020-11-21 DIAGNOSIS — R1310 Dysphagia, unspecified: Secondary | ICD-10-CM | POA: Diagnosis not present

## 2020-11-24 DIAGNOSIS — E440 Moderate protein-calorie malnutrition: Secondary | ICD-10-CM | POA: Diagnosis not present

## 2020-11-24 DIAGNOSIS — K7689 Other specified diseases of liver: Secondary | ICD-10-CM | POA: Diagnosis not present

## 2020-11-24 DIAGNOSIS — N858 Other specified noninflammatory disorders of uterus: Secondary | ICD-10-CM | POA: Diagnosis not present

## 2020-11-24 DIAGNOSIS — R634 Abnormal weight loss: Secondary | ICD-10-CM | POA: Diagnosis not present

## 2020-11-24 DIAGNOSIS — G301 Alzheimer's disease with late onset: Secondary | ICD-10-CM | POA: Diagnosis not present

## 2020-11-24 DIAGNOSIS — R1310 Dysphagia, unspecified: Secondary | ICD-10-CM | POA: Diagnosis not present

## 2020-11-26 DIAGNOSIS — F0280 Dementia in other diseases classified elsewhere without behavioral disturbance: Secondary | ICD-10-CM | POA: Diagnosis not present

## 2020-11-26 DIAGNOSIS — M810 Age-related osteoporosis without current pathological fracture: Secondary | ICD-10-CM | POA: Diagnosis not present

## 2020-11-26 DIAGNOSIS — K7689 Other specified diseases of liver: Secondary | ICD-10-CM | POA: Diagnosis not present

## 2020-11-26 DIAGNOSIS — R634 Abnormal weight loss: Secondary | ICD-10-CM | POA: Diagnosis not present

## 2020-11-26 DIAGNOSIS — E440 Moderate protein-calorie malnutrition: Secondary | ICD-10-CM | POA: Diagnosis not present

## 2020-11-26 DIAGNOSIS — G40909 Epilepsy, unspecified, not intractable, without status epilepticus: Secondary | ICD-10-CM | POA: Diagnosis not present

## 2020-11-26 DIAGNOSIS — I10 Essential (primary) hypertension: Secondary | ICD-10-CM | POA: Diagnosis not present

## 2020-11-26 DIAGNOSIS — R1310 Dysphagia, unspecified: Secondary | ICD-10-CM | POA: Diagnosis not present

## 2020-11-26 DIAGNOSIS — K219 Gastro-esophageal reflux disease without esophagitis: Secondary | ICD-10-CM | POA: Diagnosis not present

## 2020-11-26 DIAGNOSIS — M1990 Unspecified osteoarthritis, unspecified site: Secondary | ICD-10-CM | POA: Diagnosis not present

## 2020-11-26 DIAGNOSIS — N858 Other specified noninflammatory disorders of uterus: Secondary | ICD-10-CM | POA: Diagnosis not present

## 2020-11-26 DIAGNOSIS — G301 Alzheimer's disease with late onset: Secondary | ICD-10-CM | POA: Diagnosis not present

## 2020-11-27 DIAGNOSIS — R1310 Dysphagia, unspecified: Secondary | ICD-10-CM | POA: Diagnosis not present

## 2020-11-27 DIAGNOSIS — K7689 Other specified diseases of liver: Secondary | ICD-10-CM | POA: Diagnosis not present

## 2020-11-27 DIAGNOSIS — N858 Other specified noninflammatory disorders of uterus: Secondary | ICD-10-CM | POA: Diagnosis not present

## 2020-11-27 DIAGNOSIS — E440 Moderate protein-calorie malnutrition: Secondary | ICD-10-CM | POA: Diagnosis not present

## 2020-11-27 DIAGNOSIS — R634 Abnormal weight loss: Secondary | ICD-10-CM | POA: Diagnosis not present

## 2020-11-27 DIAGNOSIS — G40909 Epilepsy, unspecified, not intractable, without status epilepticus: Secondary | ICD-10-CM | POA: Diagnosis not present

## 2020-12-01 DIAGNOSIS — K7689 Other specified diseases of liver: Secondary | ICD-10-CM | POA: Diagnosis not present

## 2020-12-01 DIAGNOSIS — E440 Moderate protein-calorie malnutrition: Secondary | ICD-10-CM | POA: Diagnosis not present

## 2020-12-01 DIAGNOSIS — R634 Abnormal weight loss: Secondary | ICD-10-CM | POA: Diagnosis not present

## 2020-12-01 DIAGNOSIS — N858 Other specified noninflammatory disorders of uterus: Secondary | ICD-10-CM | POA: Diagnosis not present

## 2020-12-01 DIAGNOSIS — R1310 Dysphagia, unspecified: Secondary | ICD-10-CM | POA: Diagnosis not present

## 2020-12-01 DIAGNOSIS — G40909 Epilepsy, unspecified, not intractable, without status epilepticus: Secondary | ICD-10-CM | POA: Diagnosis not present

## 2020-12-02 DIAGNOSIS — K7689 Other specified diseases of liver: Secondary | ICD-10-CM | POA: Diagnosis not present

## 2020-12-02 DIAGNOSIS — R1310 Dysphagia, unspecified: Secondary | ICD-10-CM | POA: Diagnosis not present

## 2020-12-02 DIAGNOSIS — G40909 Epilepsy, unspecified, not intractable, without status epilepticus: Secondary | ICD-10-CM | POA: Diagnosis not present

## 2020-12-02 DIAGNOSIS — N858 Other specified noninflammatory disorders of uterus: Secondary | ICD-10-CM | POA: Diagnosis not present

## 2020-12-02 DIAGNOSIS — E440 Moderate protein-calorie malnutrition: Secondary | ICD-10-CM | POA: Diagnosis not present

## 2020-12-02 DIAGNOSIS — R634 Abnormal weight loss: Secondary | ICD-10-CM | POA: Diagnosis not present

## 2020-12-03 DIAGNOSIS — E440 Moderate protein-calorie malnutrition: Secondary | ICD-10-CM | POA: Diagnosis not present

## 2020-12-03 DIAGNOSIS — N858 Other specified noninflammatory disorders of uterus: Secondary | ICD-10-CM | POA: Diagnosis not present

## 2020-12-03 DIAGNOSIS — G40909 Epilepsy, unspecified, not intractable, without status epilepticus: Secondary | ICD-10-CM | POA: Diagnosis not present

## 2020-12-03 DIAGNOSIS — R1310 Dysphagia, unspecified: Secondary | ICD-10-CM | POA: Diagnosis not present

## 2020-12-03 DIAGNOSIS — K7689 Other specified diseases of liver: Secondary | ICD-10-CM | POA: Diagnosis not present

## 2020-12-03 DIAGNOSIS — R634 Abnormal weight loss: Secondary | ICD-10-CM | POA: Diagnosis not present

## 2020-12-06 DIAGNOSIS — K7689 Other specified diseases of liver: Secondary | ICD-10-CM | POA: Diagnosis not present

## 2020-12-06 DIAGNOSIS — E440 Moderate protein-calorie malnutrition: Secondary | ICD-10-CM | POA: Diagnosis not present

## 2020-12-06 DIAGNOSIS — G40909 Epilepsy, unspecified, not intractable, without status epilepticus: Secondary | ICD-10-CM | POA: Diagnosis not present

## 2020-12-06 DIAGNOSIS — R634 Abnormal weight loss: Secondary | ICD-10-CM | POA: Diagnosis not present

## 2020-12-06 DIAGNOSIS — R1310 Dysphagia, unspecified: Secondary | ICD-10-CM | POA: Diagnosis not present

## 2020-12-06 DIAGNOSIS — N858 Other specified noninflammatory disorders of uterus: Secondary | ICD-10-CM | POA: Diagnosis not present

## 2020-12-08 DIAGNOSIS — G40909 Epilepsy, unspecified, not intractable, without status epilepticus: Secondary | ICD-10-CM | POA: Diagnosis not present

## 2020-12-08 DIAGNOSIS — N858 Other specified noninflammatory disorders of uterus: Secondary | ICD-10-CM | POA: Diagnosis not present

## 2020-12-08 DIAGNOSIS — K7689 Other specified diseases of liver: Secondary | ICD-10-CM | POA: Diagnosis not present

## 2020-12-08 DIAGNOSIS — R1310 Dysphagia, unspecified: Secondary | ICD-10-CM | POA: Diagnosis not present

## 2020-12-08 DIAGNOSIS — R634 Abnormal weight loss: Secondary | ICD-10-CM | POA: Diagnosis not present

## 2020-12-08 DIAGNOSIS — E440 Moderate protein-calorie malnutrition: Secondary | ICD-10-CM | POA: Diagnosis not present

## 2020-12-15 DIAGNOSIS — R1310 Dysphagia, unspecified: Secondary | ICD-10-CM | POA: Diagnosis not present

## 2020-12-15 DIAGNOSIS — N858 Other specified noninflammatory disorders of uterus: Secondary | ICD-10-CM | POA: Diagnosis not present

## 2020-12-15 DIAGNOSIS — R634 Abnormal weight loss: Secondary | ICD-10-CM | POA: Diagnosis not present

## 2020-12-15 DIAGNOSIS — E440 Moderate protein-calorie malnutrition: Secondary | ICD-10-CM | POA: Diagnosis not present

## 2020-12-15 DIAGNOSIS — G40909 Epilepsy, unspecified, not intractable, without status epilepticus: Secondary | ICD-10-CM | POA: Diagnosis not present

## 2020-12-15 DIAGNOSIS — K7689 Other specified diseases of liver: Secondary | ICD-10-CM | POA: Diagnosis not present

## 2020-12-17 ENCOUNTER — Ambulatory Visit: Payer: Medicare Other | Admitting: Adult Health

## 2020-12-17 ENCOUNTER — Encounter: Payer: Self-pay | Admitting: Adult Health

## 2020-12-22 ENCOUNTER — Emergency Department (HOSPITAL_COMMUNITY)

## 2020-12-22 ENCOUNTER — Encounter (HOSPITAL_COMMUNITY): Payer: Self-pay | Admitting: Pharmacy Technician

## 2020-12-22 ENCOUNTER — Inpatient Hospital Stay (HOSPITAL_COMMUNITY)
Admission: EM | Admit: 2020-12-22 | Discharge: 2020-12-24 | DRG: 072 | Disposition: A | Discharge status: Home / Self Care | Attending: Internal Medicine | Admitting: Internal Medicine

## 2020-12-22 DIAGNOSIS — M810 Age-related osteoporosis without current pathological fracture: Secondary | ICD-10-CM | POA: Diagnosis present

## 2020-12-22 DIAGNOSIS — R4182 Altered mental status, unspecified: Secondary | ICD-10-CM | POA: Diagnosis not present

## 2020-12-22 DIAGNOSIS — F0280 Dementia in other diseases classified elsewhere without behavioral disturbance: Secondary | ICD-10-CM | POA: Diagnosis present

## 2020-12-22 DIAGNOSIS — Z79899 Other long term (current) drug therapy: Secondary | ICD-10-CM

## 2020-12-22 DIAGNOSIS — N858 Other specified noninflammatory disorders of uterus: Secondary | ICD-10-CM | POA: Diagnosis not present

## 2020-12-22 DIAGNOSIS — E440 Moderate protein-calorie malnutrition: Secondary | ICD-10-CM | POA: Diagnosis not present

## 2020-12-22 DIAGNOSIS — E785 Hyperlipidemia, unspecified: Secondary | ICD-10-CM | POA: Diagnosis present

## 2020-12-22 DIAGNOSIS — R634 Abnormal weight loss: Secondary | ICD-10-CM | POA: Diagnosis not present

## 2020-12-22 DIAGNOSIS — R1310 Dysphagia, unspecified: Secondary | ICD-10-CM | POA: Diagnosis not present

## 2020-12-22 DIAGNOSIS — G9340 Encephalopathy, unspecified: Secondary | ICD-10-CM | POA: Diagnosis not present

## 2020-12-22 DIAGNOSIS — Z515 Encounter for palliative care: Secondary | ICD-10-CM | POA: Diagnosis not present

## 2020-12-22 DIAGNOSIS — R569 Unspecified convulsions: Secondary | ICD-10-CM | POA: Diagnosis not present

## 2020-12-22 DIAGNOSIS — R9082 White matter disease, unspecified: Secondary | ICD-10-CM | POA: Diagnosis present

## 2020-12-22 DIAGNOSIS — Z885 Allergy status to narcotic agent status: Secondary | ICD-10-CM

## 2020-12-22 DIAGNOSIS — Z20822 Contact with and (suspected) exposure to covid-19: Secondary | ICD-10-CM | POA: Diagnosis present

## 2020-12-22 DIAGNOSIS — K7689 Other specified diseases of liver: Secondary | ICD-10-CM | POA: Diagnosis not present

## 2020-12-22 DIAGNOSIS — K219 Gastro-esophageal reflux disease without esophagitis: Secondary | ICD-10-CM | POA: Diagnosis present

## 2020-12-22 DIAGNOSIS — Z66 Do not resuscitate: Secondary | ICD-10-CM | POA: Diagnosis present

## 2020-12-22 DIAGNOSIS — G40909 Epilepsy, unspecified, not intractable, without status epilepticus: Secondary | ICD-10-CM | POA: Diagnosis not present

## 2020-12-22 DIAGNOSIS — I10 Essential (primary) hypertension: Secondary | ICD-10-CM | POA: Diagnosis present

## 2020-12-22 DIAGNOSIS — R4020 Unspecified coma: Secondary | ICD-10-CM | POA: Diagnosis not present

## 2020-12-22 DIAGNOSIS — Z8719 Personal history of other diseases of the digestive system: Secondary | ICD-10-CM

## 2020-12-22 DIAGNOSIS — G309 Alzheimer's disease, unspecified: Secondary | ICD-10-CM | POA: Diagnosis present

## 2020-12-22 DIAGNOSIS — Z86010 Personal history of colonic polyps: Secondary | ICD-10-CM

## 2020-12-22 DIAGNOSIS — R000 Tachycardia, unspecified: Secondary | ICD-10-CM | POA: Diagnosis not present

## 2020-12-22 DIAGNOSIS — E876 Hypokalemia: Secondary | ICD-10-CM | POA: Diagnosis not present

## 2020-12-22 LAB — COMPREHENSIVE METABOLIC PANEL
ALT: 13 U/L (ref 0–44)
AST: 21 U/L (ref 15–41)
Albumin: 3.2 g/dL — ABNORMAL LOW (ref 3.5–5.0)
Alkaline Phosphatase: 26 U/L — ABNORMAL LOW (ref 38–126)
Anion gap: 9 (ref 5–15)
BUN: 16 mg/dL (ref 8–23)
CO2: 25 mmol/L (ref 22–32)
Calcium: 8.7 mg/dL — ABNORMAL LOW (ref 8.9–10.3)
Chloride: 105 mmol/L (ref 98–111)
Creatinine, Ser: 0.9 mg/dL (ref 0.44–1.00)
GFR, Estimated: >60 mL/min (ref >60)
Glucose, Bld: 165 mg/dL — ABNORMAL HIGH (ref 70–99)
Potassium: 4.2 mmol/L (ref 3.5–5.1)
Sodium: 139 mmol/L (ref 135–145)
Total Bilirubin: 0.7 mg/dL (ref 0.3–1.2)
Total Protein: 6.2 g/dL — ABNORMAL LOW (ref 6.5–8.1)

## 2020-12-22 LAB — URINALYSIS, ROUTINE W REFLEX MICROSCOPIC
Bilirubin Urine: NEGATIVE
Glucose, UA: NEGATIVE mg/dL
Ketones, ur: NEGATIVE mg/dL
Leukocytes,Ua: NEGATIVE
Nitrite: NEGATIVE
Protein, ur: NEGATIVE mg/dL
Specific Gravity, Urine: 1.009 (ref 1.005–1.030)
pH: 7 (ref 5.0–8.0)

## 2020-12-22 LAB — CBC WITH DIFFERENTIAL/PLATELET
Abs Immature Granulocytes: 0.1 10*3/uL — ABNORMAL HIGH (ref 0.00–0.07)
Basophils Absolute: 0 10*3/uL (ref 0.0–0.1)
Basophils Relative: 0 %
Eosinophils Absolute: 0 10*3/uL (ref 0.0–0.5)
Eosinophils Relative: 0 %
HCT: 36 % (ref 36.0–46.0)
Hemoglobin: 12.6 g/dL (ref 12.0–15.0)
Immature Granulocytes: 1 %
Lymphocytes Relative: 10 %
Lymphs Abs: 0.9 10*3/uL (ref 0.7–4.0)
MCH: 32.6 pg (ref 26.0–34.0)
MCHC: 35 g/dL (ref 30.0–36.0)
MCV: 93 fL (ref 80.0–100.0)
Monocytes Absolute: 0.6 10*3/uL (ref 0.1–1.0)
Monocytes Relative: 8 %
Neutro Abs: 6.8 10*3/uL (ref 1.7–7.7)
Neutrophils Relative %: 81 %
Platelets: 191 10*3/uL (ref 150–400)
RBC: 3.87 MIL/uL (ref 3.87–5.11)
RDW: 12.2 % (ref 11.5–15.5)
WBC: 8.4 10*3/uL (ref 4.0–10.5)
nRBC: 0 % (ref 0.0–0.2)

## 2020-12-22 LAB — AMMONIA: Ammonia: 15 umol/L (ref 9–35)

## 2020-12-22 LAB — MAGNESIUM: Magnesium: 1.9 mg/dL (ref 1.7–2.4)

## 2020-12-22 LAB — CBG MONITORING, ED: Glucose-Capillary: 158 mg/dL — ABNORMAL HIGH (ref 70–99)

## 2020-12-22 MED ORDER — CEREFOLIN 6-1-50-5 MG PO TABS: 1.0000 | ORAL_TABLET | Freq: Every morning | ORAL | Status: DC

## 2020-12-22 MED ORDER — ACETAMINOPHEN 325 MG PO TABS: 650.0000 mg | ORAL_TABLET | Freq: Four times a day (QID) | ORAL | Status: DC | PRN

## 2020-12-22 MED ORDER — VALPROATE SODIUM 100 MG/ML IV SOLN
1000.0000 mg | Freq: Once | INTRAVENOUS | Status: AC
  Administered 2020-12-23: 1000 mg via INTRAVENOUS
  Filled 2020-12-22 (×2): qty 10

## 2020-12-22 MED ORDER — HALOPERIDOL 1 MG PO TABS
1.0000 mg | ORAL_TABLET | Freq: Two times a day (BID) | ORAL | Status: DC
  Administered 2020-12-22 – 2020-12-23 (×3): 1 mg via ORAL
  Filled 2020-12-22 (×3): qty 1

## 2020-12-22 MED ORDER — ONDANSETRON HCL 4 MG PO TABS: 4.0000 mg | ORAL_TABLET | Freq: Four times a day (QID) | ORAL | Status: DC | PRN

## 2020-12-22 MED ORDER — ONDANSETRON HCL 4 MG/2ML IJ SOLN: 4.0000 mg | Freq: Four times a day (QID) | INTRAMUSCULAR | Status: DC | PRN

## 2020-12-22 MED ORDER — BISACODYL 10 MG RE SUPP: 10.0000 mg | Freq: Every day | RECTAL | Status: DC | PRN

## 2020-12-22 MED ORDER — MIRTAZAPINE 7.5 MG PO TABS
7.5000 mg | ORAL_TABLET | Freq: Every day | ORAL | Status: DC
  Administered 2020-12-22 – 2020-12-23 (×2): 7.5 mg via ORAL
  Filled 2020-12-22 (×4): qty 1

## 2020-12-22 MED ORDER — ACETAMINOPHEN 650 MG RE SUPP: 650.0000 mg | Freq: Four times a day (QID) | RECTAL | Status: DC | PRN

## 2020-12-22 MED ORDER — VALPROIC ACID 250 MG PO CAPS
250.0000 mg | ORAL_CAPSULE | Freq: Three times a day (TID) | ORAL | Status: DC
  Administered 2020-12-23 – 2020-12-24 (×4): 250 mg via ORAL
  Filled 2020-12-22 (×7): qty 1

## 2020-12-22 MED ORDER — TRAZODONE HCL 50 MG PO TABS: 50.0000 mg | ORAL_TABLET | Freq: Every evening | ORAL | Status: DC | PRN

## 2020-12-22 MED ORDER — ENOXAPARIN SODIUM 40 MG/0.4ML ~~LOC~~ SOLN
40.0000 mg | SUBCUTANEOUS | Status: DC
  Administered 2020-12-22 – 2020-12-23 (×2): 40 mg via SUBCUTANEOUS
  Filled 2020-12-22 (×2): qty 0.4

## 2020-12-22 MED ORDER — LEVETIRACETAM IN NACL 1000 MG/100ML IV SOLN: 1000.0000 mg | Freq: Once | INTRAVENOUS | Status: DC

## 2020-12-22 MED ORDER — OXYCODONE HCL 5 MG PO TABS: 5.0000 mg | ORAL_TABLET | ORAL | Status: DC | PRN

## 2020-12-22 MED ORDER — TRIAMCINOLONE ACETONIDE 0.5 % EX CREA
1.0000 "application " | TOPICAL_CREAM | Freq: Two times a day (BID) | CUTANEOUS | Status: DC
  Administered 2020-12-24: 1 via TOPICAL
  Filled 2020-12-22: qty 15

## 2020-12-22 MED ORDER — QUETIAPINE FUMARATE 25 MG PO TABS
25.0000 mg | ORAL_TABLET | Freq: Two times a day (BID) | ORAL | Status: DC
  Administered 2020-12-22 – 2020-12-24 (×4): 25 mg via ORAL
  Filled 2020-12-22 (×6): qty 1

## 2020-12-22 NOTE — ED Provider Notes (Signed)
[facility] EMERGENCY DEPARTMENT Provider Note   CSN: [id number] Arrival date & time: 12/22/20  1258  LEVEL 5 CAVEAT - DEMENTIA   History Chief Complaint  Patient presents with  . Altered Mental Status    Brooke [name] is a 86 y.o. female.  HPI 86 year old female presents with altered mental status. The history is limited due to the patient's dementia. I talked with the son, Raquel Sarna over the phone. Patient's husband was with patient at lunchtime when all of a sudden she became unresponsive. Her eyes were wide open but she would not respond, even when he was shaking her. She seemed to be shaking like she might be having a seizure. Unclear how long this lasted and 911 was called. No prior history of seizures. Right now the patient is awake and alert though is disoriented. However she does respond to questions and follows commands.   Past Medical History:  Diagnosis Date  . Arthritis    hands   . AVM (arteriovenous malformation)   . Cameron lesion, acute   . Cough   . Diverticulosis   . Diverticulosis   . GERD (gastroesophageal reflux disease)   . H/O: GI bleed   . H/O: hematuria   . Hiatal hernia   . History of colon polyps   . History of hypokalemia   . Hyperlipidemia   . Hypertension   . Internal hemorrhoid   . Iron deficiency anemia   . Osteoporosis   . Paraesophageal hernia   . Paraesophageal hernia    history of  . Praesophageal hiatal hernia - giant, irreducible 10/20/2011    Patient Active Problem List   Diagnosis Date Noted  . Mild cognitive impairment 08/22/2015  . Hypokalemia 01/18/2012  . Confusion 01/18/2012  . Acute respiratory failure ([place]) 01/17/2012  . CHF (congestive heart failure) ([place]) 01/17/2012  . Pleural effusion, bilateral 01/17/2012  . CONSTIPATION 09/23/2010  . DIVERTICULOSIS, COLON 05/30/2009  . COLONIC POLYPS, HYPERPLASTIC, HX OF 05/30/2009  . ANEMIA, IRON DEFICIENCY 12/27/2007  . Essential hypertension 12/27/2007   . HEMORRHOIDS, INTERNAL 12/27/2007  . GERD 12/27/2007  . OSTEOPOROSIS 12/27/2007  . ARTERIOVENOUS MALFORMATION 12/27/2007    Past Surgical History:  Procedure Laterality Date  . CESAREAN SECTION  01/1965  . colonscopy    . DILATION AND CURETTAGE OF UTERUS    . HERNIA REPAIR  12/2011  . LAPAROSCOPIC NISSEN FUNDOPLICATION  03/17/253   Procedure: LAPAROSCOPIC NISSEN FUNDOPLICATION;  Surgeon: Adin Hector, MD;  Location: [facility];  Service: General;  Laterality: N/A;  laparoscopic paraesphogeal hernia repair and fundoplication  . POLYPECTOMY       OB History   No obstetric history on file.     Family History  Problem Relation Age of Onset  . Prostate cancer Brother   . Cancer Brother        prostate, and skin  . Colon cancer Neg Hx     Social History   Tobacco Use  . Smoking status: Never Smoker  . Smokeless tobacco: Never Used  Vaping Use  . Vaping Use: Never used  Substance Use Topics  . Alcohol use: Yes    Alcohol/week: 18.0 standard drinks    Types: 18 Glasses of wine per week    Comment: 1 glasses daily  . Drug use: No    Home Medications Prior to Admission medications   Medication Sig Start Date End Date Taking? Authorizing Provider  bisacodyl (DULCOLAX) 10 MG suppository Place 10 mg rectally daily as needed  for mild constipation. 10/21/20  Yes [provider]  haloperidol (HALDOL) 0.5 MG tablet Take 1 mg by mouth See admin instructions. Takes 2 tablets (1 mg totally) by mouth 2 times daily; takes extra 1 tablet (0.5 mg) every 4 hours as needed for agitation 12/01/20  Yes [provider]  L-Methylfolate-B12-B6-B2 (CEREFOLIN) 05-27-49-5 MG TABS Take 1 tablet by mouth in the morning.   Yes [provider]  mirtazapine (REMERON) 7.5 MG tablet Take 7.5 mg by mouth at bedtime. 12/01/20  Yes [provider]  oxyCODONE (OXY IR/ROXICODONE) 5 MG immediate release tablet Take 5 mg by mouth every 4 (four) hours as needed for severe pain.  11/21/20  Yes [provider]  QUEtiapine (SEROQUEL) 25 MG tablet Take 25 mg by mouth 2 (two) times daily. 12/22/20  Yes [provider]  traZODone (DESYREL) 50 MG tablet Take 50 mg by mouth at bedtime as needed.   Yes [provider]  triamcinolone cream (KENALOG) 0.5 % Apply 1 application topically 2 (two) times daily.   Yes [provider]  donepezil (ARICEPT) 10 MG tablet TAKE 1 TABLET BY MOUTH EVERY DAY AT BEDTIME Patient not taking: No sig reported 09/04/19   Ward Givens, NP  memantine (NAMENDA) 10 MG tablet TAKE 1 TABLET BY MOUTH TWICE A DAY Patient not taking: No sig reported 06/25/19   Ward Givens, NP  Methylfol-Algae-B12-Acetylcyst (CEREFOLIN NAC) 6-90.314-2-600 MG TABS TAKE 1 TABLET BY MOUTH EVERY DAY Patient not taking: No sig reported 10/28/20   Ward Givens, NP    Allergies    Oxycontin [oxycodone hcl]  Review of Systems   Review of Systems  Unable to perform ROS: Dementia    Physical Exam Updated Vital Signs BP 125/89   Pulse (!) 102   Temp 98 F (36.7 C) (Oral)   Resp (!) 22   SpO2 98%   Physical Exam Vitals and nursing note reviewed.  Constitutional:      General: She is not in acute distress.    Appearance: She is well-developed and well-nourished. She is not ill-appearing or diaphoretic.  HENT:     Head: Normocephalic and atraumatic.     Right Ear: External ear normal.     Left Ear: External ear normal.     Nose: Nose normal.  Eyes:     General:        Right eye: No discharge.        Left eye: No discharge.     Pupils: Pupils are equal, round, and reactive to light.  Cardiovascular:     Rate and Rhythm: Normal rate and regular rhythm.     Heart sounds: Normal heart sounds.  Pulmonary:     Effort: Pulmonary effort is normal.     Breath sounds: Normal breath sounds.  Abdominal:     Palpations: Abdomen is soft.     Tenderness: There is no abdominal tenderness.  Skin:    General: Skin is warm and dry.   Neurological:     Mental Status: She is alert. She is disoriented.     Comments: CN 3-12 grossly intact. 5/5 strength in all 4 extremities. Grossly normal sensation  Psychiatric:        Mood and Affect: Mood is not anxious.     ED Results / Procedures / Treatments   Labs (all labs ordered are listed, but only abnormal results are displayed) Labs Reviewed  COMPREHENSIVE METABOLIC PANEL - Abnormal; Notable for the following components:  Result Value   Glucose, Bld 165 (*)    Calcium 8.7 (*)    Total Protein 6.2 (*)    Albumin 3.2 (*)    Alkaline Phosphatase 26 (*)    All other components within normal limits  CBC WITH DIFFERENTIAL/PLATELET - Abnormal; Notable for the following components:   Abs Immature Granulocytes 0.10 (*)    All other components within normal limits  CBG MONITORING, ED - Abnormal; Notable for the following components:   Glucose-Capillary 158 (*)    All other components within normal limits  URINALYSIS, ROUTINE W REFLEX MICROSCOPIC    EKG EKG Interpretation  Date/Time:  Monday December 22 2020 13:15:29 EST Ventricular Rate:  92 PR Interval:    QRS Duration: 123 QT Interval:  218 QTC Calculation: 271 R Axis:   63 Text Interpretation: Sinus rhythm IVCD, consider atypical RBBB ST depression, consider ischemia, lateral lds Baseline wander in lead(s) I III aVL Poor data quality in current ECG precludes serial comparison Confirmed by Sherwood Gambler [id number]) on 12/22/2020 1:17:15 PM   Radiology CT Head Wo Contrast  Result Date: 12/22/2020 CLINICAL DATA:  Altered mental status. EXAM: CT HEAD WITHOUT CONTRAST TECHNIQUE: Contiguous axial images were obtained from the base of the skull through the vertex without intravenous contrast. COMPARISON:  January 18, 2012. FINDINGS: Brain: Mild chronic ischemic white matter disease is noted. No mass effect or midline shift is noted. Ventricular size is within normal limits. There is no evidence of mass lesion,  hemorrhage or acute infarction. Vascular: No hyperdense vessel or unexpected calcification. Skull: Normal. Negative for fracture or focal lesion. Sinuses/Orbits: No acute finding. Other: None. IMPRESSION: Mild chronic ischemic white matter disease. No acute intracranial abnormality seen. Electronically Signed   By: Marijo Conception M.D.   On: 12/22/2020 15:42    Procedures Procedures (including critical care time)  Medications Ordered in ED Medications - No data to display  ED Course  I have reviewed the triage vital signs and the nursing notes.  Pertinent labs & imaging results that were available during my care of the patient were reviewed by me and considered in my medical decision making (see chart for details).    MDM Rules/Calculators/A&P                          I discussed presentation with Dr. Cheral Marker of neurology.  He would like to examine patient and talk with family.  Sounds like this was probably a seizure.  Care transferred to Dr. Tamera Punt. Final Clinical Impression(s) / ED Diagnoses Final diagnoses:  None    Rx / DC Orders ED Discharge Orders    None       Sherwood Gambler, MD 12/22/20 1642

## 2020-12-22 NOTE — Procedures (Signed)
Patient Name: Brooke [name]  MRN: [id number]  Epilepsy Attending: Charlsie Quest  Referring Physician/Provider: Dr Caryl Pina Date: 12/22/2020 Duration: 26.19 mins  Patient history: 86yo F with AMS, staring off. EEG to evaluate for seizure  Level of alertness:  lethargic  AEDs during EEG study: None  Technical aspects: This EEG study was done with scalp electrodes positioned according to the 10-20 International system of electrode placement. Electrical activity was acquired at a sampling rate of 500Hz  and reviewed with a high frequency filter of 70Hz  and a low frequency filter of 1Hz . EEG data were recorded continuously and digitally stored.   Description: No posterior dominant rhythm was seen. EEG showed continuous generalized 3 to 6 Hz theta-delta slowing. Frequent left centro- parietal spikes were noted. Patient was also noted to have right hand rhythmic jerking. Concomitant eeg before, during and after the event didn't show any eeg change.  Hyperventilation and photic stimulation were not performed.     ABNORMALITY -Spike,  left centro- parietal region -Continuous slow, generalized  IMPRESSION: This study showed evidence of epileptogenicity arising from  left centro- parietal region. Patient was also noted to have right hand rhythmic jerking without concomitant eeg change. However, focal motor seizures may not be seen on scalp eeg. Clinical correlation is recommended. Additionally, there is moderate diffuse26 encephalopathy, non specific etiology, but could be secondary to ictal/post-ictal state.   Dr was notified   Juma Oxley 

## 2020-12-22 NOTE — Progress Notes (Signed)
LTM EEG hooked up and running - no initial skin breakdown - push button tested - neuro notified. Same leads used [facility] will need to monitor when pt gets a bed.

## 2020-12-22 NOTE — Progress Notes (Signed)
STAT EEG complete - results pending. ? ?

## 2020-12-22 NOTE — ED Notes (Signed)
Pt transported to CT ?

## 2020-12-22 NOTE — H&P (Signed)
History and Physical    QIANNA KARCHER [id number] DOB: 1932-05-06 DOA: 12/22/2020  PCP: [place], [place]  Patient coming from: Home  I have personally briefly reviewed patient's old medical records in [place]  Chief Complaint: AMS, seizure  HPI: MAYBRIE BRISBON is a 86 y.o. female with medical history significant of on hospice for uterine mass, protein calorie malnutrition, Alzheimer's dz.  Also HTN.  Pt brought in to ED by EMS after episode of sudden onset unresponsiveness noted by family.  Symptoms onset just PTA.  Son states that Ms. Szatkowski had "Eyes wide like a ghost, staring off into space and not responding".  Lasted Minutes at home.  After EMS arrived, BP 208/114, pt unresponsive with snoring respirations.  Pt has no pritr h/o seizure.   ED Course: In the ED pt gradually returned to what I suspect is baseline mental status.  Now awake, demented, speaking in full sentences.  Speech clear.  No focal deficits.  CT head neg.  Neurology evaluated.  EEG shows epileptiform discharges.  Pt felt to have new onset seizure, started on VPA.   Review of Systems: As per HPI, otherwise all review of systems negative.  Past Medical History:  Diagnosis Date  . Arthritis    hands   . AVM (arteriovenous malformation)   . Cameron lesion, acute   . Cough   . Diverticulosis   . Diverticulosis   . GERD (gastroesophageal reflux disease)   . H/O: GI bleed   . H/O: hematuria   . Hiatal hernia   . History of colon polyps   . History of hypokalemia   . Hyperlipidemia   . Hypertension   . Internal hemorrhoid   . Iron deficiency anemia   . Osteoporosis   . Paraesophageal hernia   . Paraesophageal hernia    history of  . Praesophageal hiatal hernia - giant, irreducible 10/20/2011    Past Surgical History:  Procedure Laterality Date  . CESAREAN SECTION  01/1965  . colonscopy    . DILATION AND CURETTAGE OF UTERUS    . HERNIA REPAIR  12/2011  .  LAPAROSCOPIC NISSEN FUNDOPLICATION  [id number]   Procedure: LAPAROSCOPIC NISSEN FUNDOPLICATION;  Surgeon: Adin Hector, MD;  Location: [facility];  Service: General;  Laterality: N/A;  laparoscopic paraesphogeal hernia repair and fundoplication  . POLYPECTOMY       reports that she has never smoked. She has never used smokeless tobacco. She reports current alcohol use of about 18.0 standard drinks of alcohol per week. She reports that she does not use drugs.  Allergies  Allergen Reactions  . Oxycontin [Oxycodone Hcl] Other (See Comments)    Hallucinations; pt can take now (hospice list)    Family History  Problem Relation Age of Onset  . Prostate cancer Brother   . Cancer Brother        prostate, and skin  . Colon cancer Neg Hx      Prior to Admission medications   Medication Sig Start Date End Date Taking? Authorizing Provider  bisacodyl (DULCOLAX) 10 MG suppository Place 10 mg rectally daily as needed for mild constipation. 10/21/20  Yes [provider]  haloperidol (HALDOL) 0.5 MG tablet Take 1 mg by mouth See admin instructions. Takes 2 tablets (1 mg totally) by mouth 2 times daily; takes extra 1 tablet (0.5 mg) every 4 hours as needed for agitation 12/01/20  Yes [provider]  L-Methylfolate-B12-B6-B2 (CEREFOLIN) 05-27-49-5 MG TABS Take 1 tablet by mouth  in the morning.   Yes [provider]  mirtazapine (REMERON) 7.5 MG tablet Take 7.5 mg by mouth at bedtime. 12/01/20  Yes [provider]  oxyCODONE (OXY IR/ROXICODONE) 5 MG immediate release tablet Take 5 mg by mouth every 4 (four) hours as needed for severe pain. 11/21/20  Yes [provider]  QUEtiapine (SEROQUEL) 25 MG tablet Take 25 mg by mouth 2 (two) times daily. 12/22/20  Yes [provider]  traZODone (DESYREL) 50 MG tablet Take 50 mg by mouth at bedtime as needed.   Yes [provider]  triamcinolone cream (KENALOG) 0.5 % Apply 1 application topically 2 (two)  times daily.   Yes [provider]    Physical Exam: Vitals:   12/22/20 1900 12/22/20 1930 12/22/20 2000 12/22/20 2130  BP: (!) 179/54 (!) 144/79 (!) 178/80 (!) 162/102  Pulse:  90 89 88  Resp: 17 17 16 16   Temp:      TempSrc:      SpO2:  99% 99% 100%    Constitutional: NAD, calm, comfortable Eyes: PERRL, lids and conjunctivae normal ENMT: Mucous membranes are moist. Posterior pharynx clear of any exudate or lesions.Normal dentition.  Neck: normal, supple, no masses, no thyromegaly Respiratory: clear to auscultation bilaterally, no wheezing, no crackles. Normal respiratory effort. No accessory muscle use.  Cardiovascular: Regular rate and rhythm, no murmurs / rubs / gallops. No extremity edema. 2+ pedal pulses. No carotid bruits.  Abdomen: no tenderness, no masses palpated. No hepatosplenomegaly. Bowel sounds positive.  Musculoskeletal: no clubbing / cyanosis. No joint deformity upper and lower extremities. Good ROM, no contractures. Normal muscle tone.  Skin: no rashes, lesions, ulcers. No induration Neurologic: CN 2-12 grossly intact. Sensation intact, DTR normal. Strength 5/5 in all 4.  Speech clear.  Does have hand tremor noted. Psychiatric: Pleasantly confused, demented   Labs on Admission: I have personally reviewed following labs and imaging studies  CBC: Recent Labs  Lab 12/22/20 1443  WBC 8.4  NEUTROABS 6.8  HGB 12.6  HCT 36.0  MCV 93.0  PLT 99991111   Basic Metabolic Panel: Recent Labs  Lab 12/22/20 1443 12/22/20 2000  NA 139  --   K 4.2  --   CL 105  --   CO2 25  --   GLUCOSE 165*  --   BUN 16  --   CREATININE 0.90  --   CALCIUM 8.7*  --   MG  --  1.9   GFR: CrCl cannot be calculated (Unknown ideal weight.). Liver Function Tests: Recent Labs  Lab 12/22/20 1443  AST 21  ALT 13  ALKPHOS 26*  BILITOT 0.7  PROT 6.2*  ALBUMIN 3.2*   No results for input(s): LIPASE, AMYLASE in the last 168 hours. Recent Labs  Lab 12/22/20 2000  AMMONIA  15   Coagulation Profile: No results for input(s): INR, PROTIME in the last 168 hours. Cardiac Enzymes: No results for input(s): CKTOTAL, CKMB, CKMBINDEX, TROPONINI in the last 168 hours. BNP (last 3 results) No results for input(s): PROBNP in the last 8760 hours. HbA1C: No results for input(s): HGBA1C in the last 72 hours. CBG: Recent Labs  Lab 12/22/20 1354  GLUCAP 158*   Lipid Profile: No results for input(s): CHOL, HDL, LDLCALC, TRIG, CHOLHDL, LDLDIRECT in the last 72 hours. Thyroid Function Tests: No results for input(s): TSH, T4TOTAL, FREET4, T3FREE, THYROIDAB in the last 72 hours. Anemia Panel: No results for input(s): VITAMINB12, FOLATE, FERRITIN, TIBC, IRON, RETICCTPCT in the last 72 hours.  Urine analysis:    Component Value Date/Time   COLORURINE STRAW (A) 12/22/2020 2000   APPEARANCEUR CLEAR 12/22/2020 2000   LABSPEC 1.009 12/22/2020 2000   PHURINE 7.0 12/22/2020 2000   GLUCOSEU NEGATIVE 12/22/2020 2000   HGBUR SMALL (A) 12/22/2020 2000   BILIRUBINUR NEGATIVE 12/22/2020 2000   KETONESUR NEGATIVE 12/22/2020 2000   PROTEINUR NEGATIVE 12/22/2020 2000   UROBILINOGEN 1.0 04/18/2011 1908   NITRITE NEGATIVE 12/22/2020 2000   LEUKOCYTESUR NEGATIVE 12/22/2020 2000    Radiological Exams on Admission: EEG  Result Date: 12/22/2020 Charlsie Quest, MD     12/22/2020  6:17 PM Patient Name: ARRAYA BUCK MRN: [id number] Epilepsy Attending: Charlsie Quest Referring Physician/Provider: Dr Caryl Pina Date: 12/22/2020 Duration: 26.19 mins Patient history: 85yo F with AMS, staring off. EEG to evaluate for seizure Level of alertness:  lethargic AEDs during EEG study: None Technical aspects: This EEG study was done with scalp electrodes positioned according to the 10-20 International system of electrode placement. Electrical activity was acquired at a sampling rate of 500Hz  and reviewed with a high frequency filter of 70Hz  and a low frequency filter of 1Hz . EEG data were  recorded continuously and digitally stored. Description: No posterior dominant rhythm was seen. EEG showed continuous generalized 3 to 6 Hz theta-delta slowing. Frequent left centro- parietal spikes were noted. Patient was also noted to have right hand rhythmic jerking. Concomitant eeg before, during and after the event didn't show any eeg change.  Hyperventilation and photic stimulation were not performed.   ABNORMALITY -Spike,  left centro- parietal region -Continuous slow, generalized IMPRESSION: This study showed evidence of epileptogenicity arising from  left centro- parietal region. Patient was also noted to have right hand rhythmic jerking without concomitant eeg change. However, focal motor seizures may not be seen on scalp eeg. Clinical correlation is recommended. Additionally, there is moderate diffuse26 encephalopathy, non specific etiology, but could be secondary to ictal/post-ictal state. Dr was notified   CT Head Wo Contrast  Result Date: 12/22/2020 CLINICAL DATA:  Altered mental status. EXAM: CT HEAD WITHOUT CONTRAST TECHNIQUE: Contiguous axial images were obtained from the base of the skull through the vertex without intravenous contrast. COMPARISON:  January 18, 2012. FINDINGS: Brain: Mild chronic ischemic white matter disease is noted. No mass effect or midline shift is noted. Ventricular size is within normal limits. There is no evidence of mass lesion, hemorrhage or acute infarction. Vascular: No hyperdense vessel or unexpected calcification. Skull: Normal. Negative for fracture or focal lesion. Sinuses/Orbits: No acute finding. Other: None. IMPRESSION: Mild chronic ischemic white matter disease. No acute intracranial abnormality seen. Electronically Signed   By: Charlsie Quest M.D.   On: 12/22/2020 15:42   DG CHEST PORT 1 VIEW  Result Date: 12/22/2020 CLINICAL DATA:  Altered mental status. EXAM: PORTABLE CHEST 1 VIEW COMPARISON:  Chest x-ray dated 10/03/2019  FINDINGS: Heart size and mediastinal contours are stable. Lungs are clear. No pleural effusion or pneumothorax is seen. Osseous structures about the chest are unremarkable. IMPRESSION: No active disease.  No evidence of pneumonia or pulmonary edema. Electronically Signed   By: 12/24/2020 M.D.   On: 12/22/2020 19:50    EKG: Independently reviewed.  Assessment/Plan Principal Problem:   Seizure [facility]) Active Problems:   Essential hypertension   Alzheimer's dementia (HCC)   Uterine mass   Hospice care patient    1. Seizure - 1. See neuro consult note 2. On VPA 3. Tele monitor 4. Ongoing EEG  monitoring 5. Mg and Ammonia WNL 2. Alzheimer's - 1. Cont home meds 2. On Hospice 3. Uterine mass - 1. On hospice for this 4. HTN - 1. Just monitor for the moment 2. BP 160/100 3. Not on any chronic BP meds due to hospice status  DVT prophylaxis: Lovenox Code Status: DNR/DNI - yellow form in chart Family Communication: No family in room Disposition Plan: Home when neurology clears Consults called: Neurology Admission status: Place in 80    Tenea Sens, [place] Hospital[facility]  How to contact the [facility] Attending or Consulting provider [place] or covering provider during after hours [place], for this patient?  1. Check the care team in [facility] and look for a) attending/consulting [facility] provider listed and b) the [facility] team listed 2. Log into [email or web address]  Amion Physician Scheduling and messaging for groups and whole hospitals  On call and physician scheduling software for group practices, residents, hospitalists and other medical providers for call, clinic, rotation and shift schedules. OnCall Enterprise is a hospital-wide system for scheduling doctors and paging doctors on call. EasyPlot is for scientific plotting and data analysis.  [email or web address]  and use 's universal password to access. If you do not have the password, please contact the hospital operator.  3. Locate the [facility]  provider you are looking for under [facility] and page to a number that you can be directly reached. 4. If you still have difficulty reaching the provider, please page the [facility] (Director on Call) for the Hospitalists listed on amion for assistance.  12/22/2020, 9:56 PM

## 2020-12-22 NOTE — Consult Note (Signed)
NEURO HOSPITALIST CONSULT NOTE   Requestig physician: Dr. Criss Alvine  Reason for Consult: Altered mental status with tremor  History obtained from:  Chart     HPI:                                                                                                                                          Brooke [name] is an 86 y.o. female receiving hospice care for a uterine mass, protein calorie malnutrition, and Alzheimer's disease, also with a history of diverticulosis, GERD, hypertension, hyperlipidemia, and iron deficiency anemia. Pt arrives from home via EMS. Home health care nurse called out reporting a period of unresponsiveness, Pt was lowered to ground by family prior to EMS arrival. Pt unresponsive to EMS upon their arrival with snoring respirations. EMS reports that pt started to respond to name after arrival. Hypertensive per EMS. Arrival vitals BP 208/114; 128, RR 24. O2 sat 99% 3L [place], BGL 124. 18g IV in right forearm established. Pt not on O2 at home, placed on 3L [place]. Last known normal 1200 today. Unable to complete stroke scale due to unresponsiveness. Speech does not appear to be slurred but minimal speech with EMS, will answer "yes" when name is called.  Son states that Brooke [name] had "Eyes wide like a ghost, staring off into space and not responding" for minutes at home, unable to estimate an amount of time for unresponsiveness. She has no prior history of seizure.   Past Medical History:  Diagnosis Date  . Arthritis    hands   . AVM (arteriovenous malformation)   . Cameron lesion, acute   . Cough   . Diverticulosis   . Diverticulosis   . GERD (gastroesophageal reflux disease)   . H/O: GI bleed   . H/O: hematuria   . Hiatal hernia   . History of colon polyps   . History of hypokalemia   . Hyperlipidemia   . Hypertension   . Internal hemorrhoid   . Iron deficiency anemia   . Osteoporosis   . Paraesophageal hernia   . Paraesophageal hernia     history of  . Praesophageal hiatal hernia - giant, irreducible 10/20/2011    Past Surgical History:  Procedure Laterality Date  . CESAREAN SECTION  01/1965  . colonscopy    . DILATION AND CURETTAGE OF UTERUS    . HERNIA REPAIR  12/2011  . LAPAROSCOPIC NISSEN FUNDOPLICATION  01/13/2012   Procedure: LAPAROSCOPIC NISSEN FUNDOPLICATION;  Surgeon: Ardeth Sportsman, MD;  Location: [facility];  Service: General;  Laterality: N/A;  laparoscopic paraesphogeal hernia repair and fundoplication  . POLYPECTOMY      Family History  Problem Relation Age of Onset  . Prostate cancer Brother   . Cancer Brother  prostate, and skin  . Colon cancer Neg Hx       Social History:  reports that she has never smoked. She has never used smokeless tobacco. She reports current alcohol use of about 18.0 standard drinks of alcohol per week. She reports that she does not use drugs.  Allergies  Allergen Reactions  . Oxycontin [Oxycodone Hcl] Other (See Comments)    Hallucinations   . Oxycontin [Oxycodone]     Other reaction(s): hallucination    HOME MEDICATIONS:                                                                                                                      No current facility-administered medications on file prior to encounter.   Current Outpatient Medications on File Prior to Encounter  Medication Sig Dispense Refill  . bisacodyl (DULCOLAX) 10 MG suppository Place 10 mg rectally daily as needed for mild constipation.    . haloperidol (HALDOL) 0.5 MG tablet Take 1 mg by mouth See admin instructions. Takes 2 tablets (1 mg totally) by mouth 2 times daily; takes extra 1 tablet (0.5 mg) every 4 hours as needed for agitation    . L-Methylfolate-B12-B6-B2 (CEREFOLIN) 05-27-49-5 MG TABS Take 1 tablet by mouth in the morning.    . mirtazapine (REMERON) 7.5 MG tablet Take 7.5 mg by mouth at bedtime.    Marland Kitchen oxyCODONE (OXY IR/ROXICODONE) 5 MG immediate release tablet Take 5 mg by mouth every 4 (four)  hours as needed for severe pain.    Marland Kitchen QUEtiapine (SEROQUEL) 25 MG tablet Take 25 mg by mouth 2 (two) times daily.    . traZODone (DESYREL) 50 MG tablet Take 50 mg by mouth at bedtime as needed.    . triamcinolone cream (KENALOG) 0.5 % Apply 1 application topically 2 (two) times daily.    Marland Kitchen donepezil (ARICEPT) 10 MG tablet TAKE 1 TABLET BY MOUTH EVERY DAY AT BEDTIME (Patient not taking: No sig reported) 90 tablet 3  . memantine (NAMENDA) 10 MG tablet TAKE 1 TABLET BY MOUTH TWICE A DAY (Patient not taking: No sig reported) 180 tablet 1  . Methylfol-Algae-B12-Acetylcyst (CEREFOLIN NAC) 6-90.314-2-600 MG TABS TAKE 1 TABLET BY MOUTH EVERY DAY (Patient not taking: No sig reported) 90 tablet 0     ROS:  History unobtainable from patient due to mental status   Blood pressure 125/89, pulse (!) 102, temperature 98 F (36.7 C), temperature source Oral, resp. rate (!) 22, SpO2 98 %.  General Examination:                                                                                                       Physical Exam  HEENT-  Normocephalic, atraumatic.  Normal external eye and conjunctiva.   Cardiovascular- warm extremities, regular rate on cardiac monitor   Lungs- no excessive work of breathing, symmetric chest rise with inspiration Abdomen- non-tender, non-distended Extremities- Warm, dry and intact Musculoskeletal- no joint tenderness, deformity or swelling Skin- warm and dry   Neurological Examination Mental Status: Alert, not oriented to self, place, time, or situation (Alzheimer's disease at baseline). Minimal speech output; states "yes" to some questions without dysarthria. Thanks the examiner when he explains that she is in the hospital and that we are starting a seizure medication.  Unable to follow 3 step commands but does follow simple commands with some  prompting.  Cranial Nerves: II: Visual fields grossly normal. Tracks and fixates appropriately on examiner's face. PERRL.  III,IV, VI: Ptosis not present, EOMI V,VII: Smile symmetric, facial light touch sensation unable to be assessed VIII: Hearing intact to voice IX,X: Somewhat hypophonic speech XI: Head is midline XII: Midline tongue extension Motor: Right : Upper extremity   3/5    Left:     Upper extremity   3/5  Lower extremity   4/5     Lower extremity   4/5 Diffuse coarse tremor of upper extremities is noted.  At time of follow up exam by attending, she elevates BUE antigravity without asymmetry and will flex and extend at elbow as well as grip examiner's hands with 4/5 strength bilaterally Will elevate BLE antigravity without asymmetry Sensory: Reacts to touch x 4 Deep Tendon Reflexes: 2+ and symmetric throughout Plantars: Right: downgoing   Left: downgoing Cerebellar: No ataxia with FNF but coarse upper extremity tremor is noted throughout the exam. HKS not assessed, patient unable to follow this command. Gait: Deferred   Lab Results: Basic Metabolic Panel: Recent Labs  Lab 12/22/20 1443  NA 139  K 4.2  CL 105  CO2 25  GLUCOSE 165*  BUN 16  CREATININE 0.90  CALCIUM 8.7*    CBC: Recent Labs  Lab 12/22/20 1443  WBC 8.4  NEUTROABS 6.8  HGB 12.6  HCT 36.0  MCV 93.0  PLT 191    Cardiac Enzymes: No results for input(s): CKTOTAL, CKMB, CKMBINDEX, TROPONINI in the last 168 hours.  Lipid Panel: No results for input(s): CHOL, TRIG, HDL, CHOLHDL, VLDL, LDLCALC in the last 168 hours.  Imaging: CT Head Wo Contrast  Result Date: 12/22/2020 CLINICAL DATA:  Altered mental status. EXAM: CT HEAD WITHOUT CONTRAST TECHNIQUE: Contiguous axial images were obtained from the base of the skull through the vertex without intravenous contrast. COMPARISON:  January 18, 2012. FINDINGS: Brain: Mild chronic ischemic white matter disease is noted. No mass effect or midline shift is  noted. Ventricular  size is within normal limits. There is no evidence of mass lesion, hemorrhage or acute infarction. Vascular: No hyperdense vessel or unexpected calcification. Skull: Normal. Negative for fracture or focal lesion. Sinuses/Orbits: No acute finding. Other: None. IMPRESSION: Mild chronic ischemic white matter disease. No acute intracranial abnormality seen. Electronically Signed   By: Marijo Conception M.D.   On: 12/22/2020 15:42   Assisting with this consultation: Anibal Henderson,  [facility] [phone] 12/22/2020, 3:52 PM  Impression: 86 year old female with history of Alzheimer's dementia under hospice care with unresponsive "staring off" episode today witnessed by husband; likely seizure vs. acute encephalopathy - EEG findings: Spike,  left centro- parietal region and continuous slow, generalized. This study showed evidence of epileptogenicity arising from  left centro- parietal region. Patient was also noted to have right hand rhythmic jerking without concomitant eeg change. However, focal motor seizures may not be seen on scalp eeg. Additionally, there is moderate diffuse encephalopathy, non specific etiology, but could be secondary to ictal/post-ictal state.  - Patient back at baseline mental status (dementia) during initial assessment - Initial vital signs were concerning for hypertensive crisis, but autonomic changes from seizure is also likely  - CTH with mild chronic ischemic white matter disease and no acute intracranial abnormality - Patient afebrile and without leukocytosis - Overall impression based on clinical history, exam findings and EEG: Seizure with postictal state. 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   Recommendations: - Start valproic acid with 20 mg/kg IV load followed by 250 mg PO TID - Switching spot EEG to LTM EEG - Magnesium and  ammonia levels have been ordered.  - Monitor electrolytes - Ativan PRN for evidence of recurrent seizure activity  I have seen and examined the patient. I have formulated the assessment and recommendations. 86 year old female presenting with AMS. Exam reveals cognitive/communications deficit and coarse upper extremity tremor. Mentation has improved since initial presentation, suggestive of resolving postictal state. Overall impression based on clinical history, exam findings and EEG: Seizure with postictal state. 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  Electronically signed: Dr. Kerney Elbe

## 2020-12-22 NOTE — Progress Notes (Unsigned)
   This pt is a current pt with [facility]. We have received a TC from the husband that he has called EMS stating that he thinks the pt is having a stroke.   She is currently enrolled under the hospice medicare benefit with a primary diagnosis of uterine mass with related protein calorie malnutrition and Alzheimer's disease.   Hospital liaisons will follow pt while in hospital for assistance with anything please call us at [phone]   Norm Parcel RN BSN [facility]

## 2020-12-22 NOTE — ED Triage Notes (Signed)
Pt arrives from home via EMS. Home health care nurse called out reports period of unresponsiveness, Pt was lowered to ground by family prior to EMS arrival. Pt unresponsive to EMS upon their arrival with snoring respirations. Pt is under hospice care hx of cancer and dementia. EMS reports that pt started to respond to name on arrival. HTN with EMS. Arrival vitals BP 208/114; 128, RR 24. O2 99% 3L [place], BGL 124. 18g IV in right forearm established. Pt not on O2 at home, placed on 3L [place]. Last know normal 1200 today. Unable to complete stroke scale due to unresponsiveness. Speech does not appear to be slurred but minimal speech with EMS, will answer "yes" when name is called.

## 2020-12-22 NOTE — ED Provider Notes (Signed)
Care was taken over from Dr. Criss Alvine.  Patient was seen by neurology who suspects this may represent a seizure with postictal episode.  They have started the patient on Depakote and ordered overnight EEG monitoring.  I spoke with Dr. Julian Reil with the hospitalist service to admit the patient for further treatment.   Rolan Bucco, MD 12/22/20 2015

## 2020-12-23 DIAGNOSIS — E876 Hypokalemia: Secondary | ICD-10-CM | POA: Diagnosis not present

## 2020-12-23 DIAGNOSIS — G40909 Epilepsy, unspecified, not intractable, without status epilepticus: Secondary | ICD-10-CM | POA: Diagnosis not present

## 2020-12-23 DIAGNOSIS — Z86010 Personal history of colonic polyps: Secondary | ICD-10-CM | POA: Diagnosis not present

## 2020-12-23 DIAGNOSIS — R1310 Dysphagia, unspecified: Secondary | ICD-10-CM | POA: Diagnosis not present

## 2020-12-23 DIAGNOSIS — M810 Age-related osteoporosis without current pathological fracture: Secondary | ICD-10-CM | POA: Diagnosis present

## 2020-12-23 DIAGNOSIS — Z79899 Other long term (current) drug therapy: Secondary | ICD-10-CM | POA: Diagnosis not present

## 2020-12-23 DIAGNOSIS — Z885 Allergy status to narcotic agent status: Secondary | ICD-10-CM | POA: Diagnosis not present

## 2020-12-23 DIAGNOSIS — Z20822 Contact with and (suspected) exposure to covid-19: Secondary | ICD-10-CM | POA: Diagnosis present

## 2020-12-23 DIAGNOSIS — F0280 Dementia in other diseases classified elsewhere without behavioral disturbance: Secondary | ICD-10-CM | POA: Diagnosis present

## 2020-12-23 DIAGNOSIS — K219 Gastro-esophageal reflux disease without esophagitis: Secondary | ICD-10-CM | POA: Diagnosis present

## 2020-12-23 DIAGNOSIS — R4182 Altered mental status, unspecified: Secondary | ICD-10-CM

## 2020-12-23 DIAGNOSIS — Z66 Do not resuscitate: Secondary | ICD-10-CM | POA: Diagnosis present

## 2020-12-23 DIAGNOSIS — Z8719 Personal history of other diseases of the digestive system: Secondary | ICD-10-CM | POA: Diagnosis not present

## 2020-12-23 DIAGNOSIS — G309 Alzheimer's disease, unspecified: Secondary | ICD-10-CM

## 2020-12-23 DIAGNOSIS — K7689 Other specified diseases of liver: Secondary | ICD-10-CM | POA: Diagnosis not present

## 2020-12-23 DIAGNOSIS — N858 Other specified noninflammatory disorders of uterus: Secondary | ICD-10-CM | POA: Diagnosis not present

## 2020-12-23 DIAGNOSIS — G9340 Encephalopathy, unspecified: Secondary | ICD-10-CM | POA: Diagnosis present

## 2020-12-23 DIAGNOSIS — Z515 Encounter for palliative care: Secondary | ICD-10-CM | POA: Diagnosis not present

## 2020-12-23 DIAGNOSIS — R634 Abnormal weight loss: Secondary | ICD-10-CM | POA: Diagnosis not present

## 2020-12-23 DIAGNOSIS — I10 Essential (primary) hypertension: Secondary | ICD-10-CM | POA: Diagnosis not present

## 2020-12-23 DIAGNOSIS — E785 Hyperlipidemia, unspecified: Secondary | ICD-10-CM | POA: Diagnosis present

## 2020-12-23 DIAGNOSIS — F0281 Dementia in other diseases classified elsewhere with behavioral disturbance: Secondary | ICD-10-CM | POA: Diagnosis not present

## 2020-12-23 DIAGNOSIS — R9082 White matter disease, unspecified: Secondary | ICD-10-CM | POA: Diagnosis present

## 2020-12-23 DIAGNOSIS — R569 Unspecified convulsions: Secondary | ICD-10-CM | POA: Diagnosis not present

## 2020-12-23 DIAGNOSIS — E440 Moderate protein-calorie malnutrition: Secondary | ICD-10-CM | POA: Diagnosis not present

## 2020-12-23 LAB — SARS CORONAVIRUS 2 (TAT 6-24 HRS): SARS Coronavirus 2: NEGATIVE

## 2020-12-23 LAB — CBC
HCT: 40.7 % (ref 36.0–46.0)
Hemoglobin: 13.6 g/dL (ref 12.0–15.0)
MCH: 30.6 pg (ref 26.0–34.0)
MCHC: 33.4 g/dL (ref 30.0–36.0)
MCV: 91.5 fL (ref 80.0–100.0)
Platelets: 218 10*3/uL (ref 150–400)
RBC: 4.45 MIL/uL (ref 3.87–5.11)
RDW: 11.9 % (ref 11.5–15.5)
WBC: 10.6 10*3/uL — ABNORMAL HIGH (ref 4.0–10.5)
nRBC: 0 % (ref 0.0–0.2)

## 2020-12-23 LAB — BASIC METABOLIC PANEL
Anion gap: 12 (ref 5–15)
BUN: 9 mg/dL (ref 8–23)
CO2: 26 mmol/L (ref 22–32)
Calcium: 8.7 mg/dL — ABNORMAL LOW (ref 8.9–10.3)
Chloride: 101 mmol/L (ref 98–111)
Creatinine, Ser: 0.88 mg/dL (ref 0.44–1.00)
GFR, Estimated: >60 mL/min (ref >60)
Glucose, Bld: 96 mg/dL (ref 70–99)
Potassium: 3.3 mmol/L — ABNORMAL LOW (ref 3.5–5.1)
Sodium: 139 mmol/L (ref 135–145)

## 2020-12-23 MED ORDER — POTASSIUM CHLORIDE 20 MEQ PO PACK
40.0000 meq | PACK | Freq: Once | ORAL | Status: AC
  Administered 2020-12-23: 15:00:00 40 meq via ORAL
  Filled 2020-12-23: qty 2

## 2020-12-23 NOTE — ED Notes (Signed)
Patient is a full assist to drink and eat as she does not remember to put the food in her mouth. Patient being assisted at this time to drink the potassium supplement in OJ/cran mix.

## 2020-12-23 NOTE — Progress Notes (Addendum)
Subjective: Appears calm of follow up assessment today.   Objective: Current vital signs: BP (!) 137/58   Pulse 72   Temp 98 F (36.7 C) (Oral)   Resp 16   SpO2 100%  Vital signs in last 24 hours: Temp:  [98 F (36.7 C)] 98 F (36.7 C) (12/27 1320) Pulse Rate:  [65-103] 72 (12/28 1030) Resp:  [10-22] 16 (12/28 1030) BP: (125-179)/(54-139) 137/58 (12/28 1030) SpO2:  [97 %-100 %] 100 % (12/28 1030)  Intake/Output from previous day: 12/27 0701 - 12/28 0700 In: 50 [IV Piggyback:50] Out: -  Intake/Output this shift: No intake/output data recorded. Nutritional status:  Diet Order            Diet regular Room service appropriate? Yes; Fluid consistency: Thin  Diet effective now                HEENT: [place]/AT Lungs: Respirations unlabored Ext: No edema  Neurologic Exam: Ment: Awake and alert. Calm. Attempts to cooperate. Alert, not oriented to self, place, time, or situation (Alzheimer's disease at baseline). Minimal speech output; states "yes" to some questions without dysarthria. Does follow simple commands with some prompting. Only significant change from yesterday is calmer affect.  CN: EOMI. Face symmetric.  Motor: Moves all 4 extremities without asymmetry Sensory: Intact to FT x 4 Cerebellar: Did not follow commands for assessment.   Lab Results: Results for orders placed or performed during the hospital encounter of 12/22/20 (from the past 48 hour(s))  CBG monitoring, ED     Status: Abnormal   Collection Time: 12/22/20  1:54 PM  Result Value Ref Range   Glucose-Capillary 158 (H) 70 - 99 mg/dL    Comment: Glucose reference range applies only to samples taken after fasting for at least 8 hours.  Comprehensive metabolic panel     Status: Abnormal   Collection Time: 12/22/20  2:43 PM  Result Value Ref Range   Sodium 139 135 - 145 mmol/L   Potassium 4.2 3.5 - 5.1 mmol/L    Comment: SLIGHT HEMOLYSIS   Chloride 105 98 - 111 mmol/L   CO2 25 22 - 32 mmol/L   Glucose,  Bld 165 (H) 70 - 99 mg/dL    Comment: Glucose reference range applies only to samples taken after fasting for at least 8 hours.   BUN 16 8 - 23 mg/dL   Creatinine, Ser 6.16 0.44 - 1.00 mg/dL   Calcium 8.7 (L) 8.9 - 10.3 mg/dL   Total Protein 6.2 (L) 6.5 - 8.1 g/dL   Albumin 3.2 (L) 3.5 - 5.0 g/dL   AST 21 15 - 41 U/L   ALT 13 0 - 44 U/L   Alkaline Phosphatase 26 (L) 38 - 126 U/L   Total Bilirubin 0.7 0.3 - 1.2 mg/dL   GFR, Estimated >07 >37 mL/min    Comment: (NOTE) Calculated using the CKD-EPI Creatinine Equation (2021)    Anion gap 9 5 - 15    Comment: Performed at [facility], [address] [place], [place] [address]  CBC with Differential     Status: Abnormal   Collection Time: 12/22/20  2:43 PM  Result Value Ref Range   WBC 8.4 4.0 - 10.5 K/uL   RBC 3.87 3.87 - 5.11 MIL/uL   Hemoglobin 12.6 12.0 - 15.0 g/dL   HCT 94.8 54.6 - 27.0 %   MCV 93.0 80.0 - 100.0 fL   MCH 32.6 26.0 - 34.0 pg   MCHC 35.0 30.0 - 36.0  g/dL   RDW 12.2 11.5 - 15.5 %   Platelets 191 150 - 400 K/uL   nRBC 0.0 0.0 - 0.2 %   Neutrophils Relative % 81 %   Neutro Abs 6.8 1.7 - 7.7 K/uL   Lymphocytes Relative 10 %   Lymphs Abs 0.9 0.7 - 4.0 K/uL   Monocytes Relative 8 %   Monocytes Absolute 0.6 0.1 - 1.0 K/uL   Eosinophils Relative 0 %   Eosinophils Absolute 0.0 0.0 - 0.5 K/uL   Basophils Relative 0 %   Basophils Absolute 0.0 0.0 - 0.1 K/uL   Immature Granulocytes 1 %   Abs Immature Granulocytes 0.10 (H) 0.00 - 0.07 K/uL    Comment: Performed at [facility] [address] [place], [place] [address]  SARS CORONAVIRUS 2 (TAT 6-24 HRS) Nasopharyngeal Nasopharyngeal Swab     Status: None   Collection Time: 12/22/20  7:21 PM   Specimen: Nasopharyngeal Swab  Result Value Ref Range   SARS Coronavirus 2 NEGATIVE NEGATIVE    Comment: (NOTE) SARS-CoV-2 target nucleic acids are NOT DETECTED.  The SARS-CoV-2 RNA is generally detectable in upper and lower respiratory specimens during the  acute phase of infection. Negative results do not preclude SARS-CoV-2 infection, do not rule out co-infections with other pathogens, and should not be used as the sole basis for treatment or other patient management decisions. Negative results must be combined with clinical observations, patient history, and epidemiological information. The expected result is Negative.  Fact Sheet for Patients: SugarRoll.be  Fact Sheet for Healthcare Providers: https://www.woods-mathews.com/  This test is not yet approved or cleared by the [place] FDA and  has been authorized for detection and/or diagnosis of SARS-CoV-2 by FDA under an Emergency Use Authorization (EUA). This EUA will remain  in effect (meaning this test can be used) for the duration of the COVID-19 declaration under Se ction 564(b)(1) of the Act, 21 U.S.C. section 360bbb-3(b)(1), unless the authorization is terminated or revoked sooner.  Performed at [facility], [place] [address], [place], [place] [address]   Urinalysis, Routine w reflex microscopic     Status: Abnormal   Collection Time: 12/22/20  8:00 PM  Result Value Ref Range   Color, Urine STRAW (A) YELLOW   APPearance CLEAR CLEAR   Specific Gravity, Urine 1.009 1.005 - 1.030   pH 7.0 5.0 - 8.0   Glucose, UA NEGATIVE NEGATIVE mg/dL   Hgb urine dipstick SMALL (A) NEGATIVE   Bilirubin Urine NEGATIVE NEGATIVE   Ketones, ur NEGATIVE NEGATIVE mg/dL   Protein, ur NEGATIVE NEGATIVE mg/dL   Nitrite NEGATIVE NEGATIVE   Leukocytes,Ua NEGATIVE NEGATIVE   RBC / HPF 0-5 0 - 5 RBC/hpf   WBC, UA 0-5 0 - 5 WBC/hpf   Bacteria, UA RARE (A) NONE SEEN   Hyaline Casts, UA PRESENT     Comment: Performed at [facility] [address], [place], [place] [address]  Magnesium     Status: None   Collection Time: 12/22/20  8:00 PM  Result Value Ref Range   Magnesium 1.9 1.7 - 2.4 mg/dL    Comment: Performed at [facility],  [place] [address] [place], [place] [address]  Ammonia     Status: None   Collection Time: 12/22/20  8:00 PM  Result Value Ref Range   Ammonia 15 9 - 35 umol/L    Comment: Performed at [facility], [place] [address], [place], [place] [address]  CBC     Status: Abnormal   Collection Time: 12/23/20  5:01 AM  Result Value Ref Range   WBC 10.6 (H) 4.0 - 10.5 K/uL   RBC 4.45 3.87 - 5.11 MIL/uL   Hemoglobin 13.6 12.0 - 15.0 g/dL   HCT 40.7 36.0 - 46.0 %   MCV 91.5 80.0 - 100.0 fL   MCH 30.6 26.0 - 34.0 pg   MCHC 33.4 30.0 - 36.0 g/dL   RDW 11.9 11.5 - 15.5 %   Platelets 218 150 - 400 K/uL   nRBC 0.0 0.0 - 0.2 %    Comment: Performed at [facility], [place] [address], [place], [place] [id number]  Basic metabolic panel     Status: Abnormal   Collection Time: 12/23/20  5:01 AM  Result Value Ref Range   Sodium 139 135 - 145 mmol/L   Potassium 3.3 (L) 3.5 - 5.1 mmol/L   Chloride 101 98 - 111 mmol/L   CO2 26 22 - 32 mmol/L   Glucose, Bld 96 70 - 99 mg/dL    Comment: Glucose reference range applies only to samples taken after fasting for at least 8 hours.   BUN 9 8 - 23 mg/dL   Creatinine, Ser 0.88 0.44 - 1.00 mg/dL   Calcium 8.7 (L) 8.9 - 10.3 mg/dL   GFR, Estimated >60 >60 mL/min    Comment: (NOTE) Calculated using the CKD-EPI Creatinine Equation (2021)    Anion gap 12 5 - 15    Comment: Performed at [facility] [address] [place], [place] [address]    Recent Results (from the past 240 hour(s))  SARS CORONAVIRUS 2 (TAT 6-24 HRS) Nasopharyngeal Nasopharyngeal Swab     Status: None   Collection Time: 12/22/20  7:21 PM   Specimen: Nasopharyngeal Swab  Result Value Ref Range Status   SARS Coronavirus 2 NEGATIVE NEGATIVE Final    Comment: (NOTE) SARS-CoV-2 target nucleic acids are NOT DETECTED.  The SARS-CoV-2 RNA is generally detectable in upper and lower respiratory specimens during the acute phase of infection. Negative results do not preclude SARS-CoV-2 infection,  do not rule out co-infections with other pathogens, and should not be used as the sole basis for treatment or other patient management decisions. Negative results must be combined with clinical observations, patient history, and epidemiological information. The expected result is Negative.  Fact Sheet for Patients: SugarRoll.be  Fact Sheet for Healthcare Providers: https://www.woods-mathews.com/  This test is not yet approved or cleared by the [place] FDA and  has been authorized for detection and/or diagnosis of SARS-CoV-2 by FDA under an Emergency Use Authorization (EUA). This EUA will remain  in effect (meaning this test can be used) for the duration of the COVID-19 declaration under Se ction 564(b)(1) of the Act, 21 U.S.C. section 360bbb-3(b)(1), unless the authorization is terminated or revoked sooner.  Performed at [facility], [place] [address], [place], [place] [address]     Lipid Panel No results for input(s): CHOL, TRIG, HDL, CHOLHDL, VLDL, LDLCALC in the last 72 hours.  Studies/Results: EEG  Result Date: 12/22/2020 Lora Havens, MD     12/22/2020  6:17 PM Patient Name: Brooke [name] MRN: [id number] Epilepsy Attending: Lora Havens Referring Physician/Provider: Dr Kerney Elbe Date: 12/22/2020 Duration: 26.19 mins Patient history: 86yo F with AMS, staring off. EEG to evaluate for seizure Level of alertness:  lethargic AEDs during EEG study: None Technical aspects: This EEG study was done with scalp  electrodes positioned according to the 10-20 International system of electrode placement. Electrical activity was acquired at a sampling rate of 500Hz  and reviewed with a high frequency filter of 70Hz  and a low frequency filter of 1Hz . EEG data were recorded continuously and digitally stored. Description: No posterior dominant rhythm was seen. EEG showed continuous generalized 3 to 6 Hz theta-delta slowing. Frequent  left centro- parietal spikes were noted. Patient was also noted to have right hand rhythmic jerking. Concomitant eeg before, during and after the event didn't show any eeg change.  Hyperventilation and photic stimulation were not performed.   ABNORMALITY -Spike,  left centro- parietal region -Continuous slow, generalized IMPRESSION: This study showed evidence of epileptogenicity arising from  left centro- parietal region. Patient was also noted to have right hand rhythmic jerking without concomitant eeg change. However, focal motor seizures may not be seen on scalp eeg. Clinical correlation is recommended. Additionally, there is moderate diffuse26 encephalopathy, non specific etiology, but could be secondary to ictal/post-ictal state. Dr Cheral Marker was notified Lora Havens   CT Head Wo Contrast  Result Date: 12/22/2020 CLINICAL DATA:  Altered mental status. EXAM: CT HEAD WITHOUT CONTRAST TECHNIQUE: Contiguous axial images were obtained from the base of the skull through the vertex without intravenous contrast. COMPARISON:  January 18, 2012. FINDINGS: Brain: Mild chronic ischemic white matter disease is noted. No mass effect or midline shift is noted. Ventricular size is within normal limits. There is no evidence of mass lesion, hemorrhage or acute infarction. Vascular: No hyperdense vessel or unexpected calcification. Skull: Normal. Negative for fracture or focal lesion. Sinuses/Orbits: No acute finding. Other: None. IMPRESSION: Mild chronic ischemic white matter disease. No acute intracranial abnormality seen. Electronically Signed   By: Marijo Conception M.D.   On: 12/22/2020 15:42   DG CHEST PORT 1 VIEW  Result Date: 12/22/2020 CLINICAL DATA:  Altered mental status. EXAM: PORTABLE CHEST 1 VIEW COMPARISON:  Chest x-ray dated 10/03/2019 FINDINGS: Heart size and mediastinal contours are stable. Lungs are clear. No pleural effusion or pneumothorax is seen. Osseous structures about the chest are  unremarkable. IMPRESSION: No active disease.  No evidence of pneumonia or pulmonary edema. Electronically Signed   By: Franki Cabot M.D.   On: 12/22/2020 19:50   Overnight EEG with video  Result Date: 12/23/2020 Lora Havens, MD     12/23/2020  9:48 AM Patient Name: Brooke [name] MRN: [id number] Epilepsy Attending: Lora Havens Referring Physician/Provider: Dr Kerney Elbe Duration:  12/22/2020 1817 to 12/28/291 0945  Patient history: 86yo F with AMS, staring off. EEG to evaluate for seizure  Level of alertness:  lethargic, asleep  AEDs during EEG study: VPA  Technical aspects: This EEG study was done with scalp electrodes positioned according to the 10-20 International system of electrode placement. Electrical activity was acquired at a sampling rate of 500Hz  and reviewed with a high frequency filter of 70Hz  and a low frequency filter of 1Hz . EEG data were recorded continuously and digitally stored.  Description: No posterior dominant rhythm was seen. Sleep was characterized by vertex waves, sleep spindles (12 to 14 Hz), maximal frontocentral region. EEG showed continuous generalized 3 to 6 Hz theta-delta slowing. Left centro- parietal spikes were noted. Hyperventilation and photic stimulation were not performed.    ABNORMALITY -Spike,  left centro- parietal region -Continuous slow, generalized  IMPRESSION: This study showed evidence of epileptogenicity arising from  left centro- parietal region as well as moderate diffuse encephalopathy, non specific etiology, but could be  secondary to ictal/post-ictal state.  EEG appears similar to yesterday. Priyanka Barbra Sarks    Medications:  Scheduled: . enoxaparin (LOVENOX) injection  40 mg Subcutaneous Q24H  . haloperidol  1 mg Oral BID  . mirtazapine  7.5 mg Oral QHS  . QUEtiapine  25 mg Oral BID  . triamcinolone cream  1 application Topical BID  . valproic acid  250 mg Oral TID    LTM EEG report for this AM: ABNORMALITY -Spike, left  centro- parietal region -Continuousslow, generalized IMPRESSION: This study showed evidence of epileptogenicity arising fromleft centro- parietal region as well as moderate diffuse encephalopathy, non specific etiology, but could be secondary to ictal/post-ictal state. EEG appears similar to yesterday.   Impression: 86 year old female with history of Alzheimer's dementia under hospice care with a witnessed unresponsive "staring off" episode on the day of admission. EEG findings most consistent with epileptogenicity arising from the left centro-parietal region.  - LTM EEG findings: Spike, left centro- parietal region; continuousslow, generalized. This study showed evidence of epileptogenicity arising fromleft centro- parietal region as well as moderate diffuse encephalopathy, non specific etiology, but could be secondary to ictal/post-ictal state. EEG appears similar to yesterday. - Patient clinically improved today  - CTH with mild chronic ischemic white matter disease and no acute intracranial abnormality - Mg and ammonia levels normal   Recommendations: - Continue valproic acid at 250 mg PO TID - Discontinuing LTM EEG - Ativan PRN for evidence of recurrent seizure activity - Neurology will sign off at this time. Please call if there are additional questions.     LOS: 0 days   @Electronically  signed: Dr. Kerney Elbe 12/23/2020  11:28 AM

## 2020-12-23 NOTE — ED Notes (Signed)
Public affairs consultant Ordered @ [phone]

## 2020-12-23 NOTE — Progress Notes (Addendum)
PROGRESS NOTE    Brooke [name]  RSW:[id number] DOB: 09/01/1932 DOA: 12/22/2020 PCP: [place]   Brief Narrative:  Brooke [name] is a 86 y.o. female with medical history significant of on hospice for uterine mass, protein calorie malnutrition, Alzheimer's dz. , HTN.  Pt brought in to ED by EMS after episode of sudden onset unresponsiveness noted by family.  Symptoms onset just PTA.  Son states that Brooke [name] had"Eyes wide like a ghost, staring off into space and not responding".  Lasted Minutes at home. After EMS arrived, BP 208/114, pt unresponsive with snoring respirations. No previous history of seizure disorder. She is on hospice due to history of uterine mass.   ED Course: CT head neg. Neurology evaluated.  EEG shows epileptiform discharges. Pt felt to have new onset seizure, started on VPA.  Assessment & Plan:  New onset seizures: -Patient has history of Alzheimer's dementia and she is under hospice care-presented with unresponsiveness. -CT head negative for acute findings. -EEG shows evidence of epileptogenicity arising from left central parietal region as well as moderate diffuse encephalopathy which could be secondary to ictal/post ictal state. -Magnesium and ammonia level: WNL. -Continue valproic acid 250 mg p.o. 3 times daily as per neurology recommendations -Ativan as needed for  recurrent seizures activity-neurology signed off. Appreciate assistance. -On seizures and aspiration precautions. -Consult PT/OT/SLP  Uterine mass:  -Reviewed CT abdomen/pelvis result from 4/21 which shows central uterine hypoattenuation and soft tissue fullness within the lower uterine segment and cervix. Cannot exclude endometrial or cervical carcinoma. -She is with hospice of the [facility].  Alzheimer's dementia: -Continue supportive care and continue home meds Remeron, Seroquel, trazodone  Hypokalemia: Replenished. Repeat BMP tomorrow a.m.  Mild  leukocytosis: Likely reactive. Febrile. COVID-19 negative. Chest x-ray negative for infection. UA negative for infection. Repeat CBC tomorrow AM. No indication of antibiotics at this time.  Hypertension: Blood pressure elevated but improving -Not on any medication due to hospice status. Continue to monitor.  We will observe her overnight. If no further seizures like activity-can be discharged home with home hospice.  DVT prophylaxis: SCD/Lovenox Code Status: DNR Family Communication:  None present at bedside.  Plan of care discussed with patient in length and she verbalized understanding and agreed with it.  I called patient's son and discussed about the patient and he verbalized understanding.  Disposition Plan: Likely home with hospice care tomorrow  Consultants:   Neurology  Procedures:   CT head  EEG  Antimicrobials:   None  Status is: Observation  Dispo: The patient is from: Home              Anticipated d/c is to: Home              Anticipated d/c date is: 1 day              Patient currently is not medically stable to d/c.   Subjective: Patient seen and examined in ED. Somnolent, talks briefly-said" I am all right" no seizure-like activity in ED reported.  Objective: Vitals:   12/23/20 1100 12/23/20 1230 12/23/20 1300 12/23/20 1330  BP: (!) 141/119 127/64 (!) 119/56 (!) 148/72  Pulse: 74 83 66 75  Resp: 18 13 15 17   Temp:      TempSrc:      SpO2:  97% 98% 99%    Intake/Output Summary (Last 24 hours) at 12/23/2020 1416 Last data filed at 12/23/2020 0154 Gross per 24 hour  Intake 50 ml  Output --  Net 50 ml   There were no vitals filed for this visit.  Examination:  General exam: Appears calm and comfortable, thin and lean, on room air, elderly, has EEG leads noted on her head, somnolent Respiratory system: Clear to auscultation. Respiratory effort normal. Cardiovascular system: S1 & S2 heard, RRR. No JVD, murmurs, rubs, gallops or clicks. No pedal  edema. Gastrointestinal system: Abdomen is nondistended, soft and nontender. No organomegaly or masses felt. Normal bowel sounds heard. Central nervous system: Sleepy and talks in sentences briefly  skin: No rashes, lesions or ulcers    Data Reviewed: I have personally reviewed following labs and imaging studies  CBC: Recent Labs  Lab 12/22/20 1443 12/23/20 0501  WBC 8.4 10.6*  NEUTROABS 6.8  --   HGB 12.6 13.6  HCT 36.0 40.7  MCV 93.0 91.5  PLT 191 218   Basic Metabolic Panel: Recent Labs  Lab 12/22/20 1443 12/22/20 2000 12/23/20 0501  NA 139  --  139  K 4.2  --  3.3*  CL 105  --  101  CO2 25  --  26  GLUCOSE 165*  --  96  BUN 16  --  9  CREATININE 0.90  --  0.88  CALCIUM 8.7*  --  8.7*  MG  --  1.9  --    GFR: CrCl cannot be calculated (Unknown ideal weight.). Liver Function Tests: Recent Labs  Lab 12/22/20 1443  AST 21  ALT 13  ALKPHOS 26*  BILITOT 0.7  PROT 6.2*  ALBUMIN 3.2*   No results for input(s): LIPASE, AMYLASE in the last 168 hours. Recent Labs  Lab 12/22/20 2000  AMMONIA 15   Coagulation Profile: No results for input(s): INR, PROTIME in the last 168 hours. Cardiac Enzymes: No results for input(s): CKTOTAL, CKMB, CKMBINDEX, TROPONINI in the last 168 hours. BNP (last 3 results) No results for input(s): PROBNP in the last 8760 hours. HbA1C: No results for input(s): HGBA1C in the last 72 hours. CBG: Recent Labs  Lab 12/22/20 1354  GLUCAP 158*   Lipid Profile: No results for input(s): CHOL, HDL, LDLCALC, TRIG, CHOLHDL, LDLDIRECT in the last 72 hours. Thyroid Function Tests: No results for input(s): TSH, T4TOTAL, FREET4, T3FREE, THYROIDAB in the last 72 hours. Anemia Panel: No results for input(s): VITAMINB12, FOLATE, FERRITIN, TIBC, IRON, RETICCTPCT in the last 72 hours. Sepsis Labs: No results for input(s): PROCALCITON, LATICACIDVEN in the last 168 hours.  Recent Results (from the past 240 hour(s))  SARS CORONAVIRUS 2 (TAT 6-24  HRS) Nasopharyngeal Nasopharyngeal Swab     Status: None   Collection Time: 12/22/20  7:21 PM   Specimen: Nasopharyngeal Swab  Result Value Ref Range Status   SARS Coronavirus 2 NEGATIVE NEGATIVE Final    Comment: (NOTE) SARS-CoV-2 target nucleic acids are NOT DETECTED.  The SARS-CoV-2 RNA is generally detectable in upper and lower respiratory specimens during the acute phase of infection. Negative results do not preclude SARS-CoV-2 infection, do not rule out co-infections with other pathogens, and should not be used as the sole basis for treatment or other patient management decisions. Negative results must be combined with clinical observations, patient history, and epidemiological information. The expected result is Negative.  Fact Sheet for Patients: HairSlick.no  Fact Sheet for Healthcare Providers: quierodirigir.com  This test is not yet approved or cleared by the [place] FDA and  has been authorized for detection and/or diagnosis of SARS-CoV-2 by FDA under an Emergency Use Authorization (EUA). This EUA will  remain  in effect (meaning this test can be used) for the duration of the COVID-19 declaration under Se ction 564(b)(1) of the Act, 21 U.S.C. section 360bbb-3(b)(1), unless the authorization is terminated or revoked sooner.  Performed at [facility], [place] [address], [place], [place] [address]       Radiology Studies: EEG  Result Date: 12/22/2020 Lora Havens, MD     12/22/2020  6:17 PM Patient Name: Brooke [name] MRN: [id number] Epilepsy Attending: Lora Havens Referring Physician/Provider: Dr Kerney Elbe Date: 12/22/2020 Duration: 26.19 mins Patient history: 86yo F with AMS, staring off. EEG to evaluate for seizure Level of alertness:  lethargic AEDs during EEG study: None Technical aspects: This EEG study was done with scalp electrodes positioned according to the 10-20 International  system of electrode placement. Electrical activity was acquired at a sampling rate of 500Hz  and reviewed with a high frequency filter of 70Hz  and a low frequency filter of 1Hz . EEG data were recorded continuously and digitally stored. Description: No posterior dominant rhythm was seen. EEG showed continuous generalized 3 to 6 Hz theta-delta slowing. Frequent left centro- parietal spikes were noted. Patient was also noted to have right hand rhythmic jerking. Concomitant eeg before, during and after the event didn't show any eeg change.  Hyperventilation and photic stimulation were not performed.   ABNORMALITY -Spike,  left centro- parietal region -Continuous slow, generalized IMPRESSION: This study showed evidence of epileptogenicity arising from  left centro- parietal region. Patient was also noted to have right hand rhythmic jerking without concomitant eeg change. However, focal motor seizures may not be seen on scalp eeg. Clinical correlation is recommended. Additionally, there is moderate diffuse26 encephalopathy, non specific etiology, but could be secondary to ictal/post-ictal state. Dr Cheral Marker was notified Lora Havens   CT Head Wo Contrast  Result Date: 12/22/2020 CLINICAL DATA:  Altered mental status. EXAM: CT HEAD WITHOUT CONTRAST TECHNIQUE: Contiguous axial images were obtained from the base of the skull through the vertex without intravenous contrast. COMPARISON:  January 18, 2012. FINDINGS: Brain: Mild chronic ischemic white matter disease is noted. No mass effect or midline shift is noted. Ventricular size is within normal limits. There is no evidence of mass lesion, hemorrhage or acute infarction. Vascular: No hyperdense vessel or unexpected calcification. Skull: Normal. Negative for fracture or focal lesion. Sinuses/Orbits: No acute finding. Other: None. IMPRESSION: Mild chronic ischemic white matter disease. No acute intracranial abnormality seen. Electronically Signed   By: Marijo Conception  M.D.   On: 12/22/2020 15:42   DG CHEST PORT 1 VIEW  Result Date: 12/22/2020 CLINICAL DATA:  Altered mental status. EXAM: PORTABLE CHEST 1 VIEW COMPARISON:  Chest x-ray dated 10/03/2019 FINDINGS: Heart size and mediastinal contours are stable. Lungs are clear. No pleural effusion or pneumothorax is seen. Osseous structures about the chest are unremarkable. IMPRESSION: No active disease.  No evidence of pneumonia or pulmonary edema. Electronically Signed   By: Franki Cabot M.D.   On: 12/22/2020 19:50   Overnight EEG with video  Result Date: 12/23/2020 Lora Havens, MD     12/23/2020  9:48 AM Patient Name: Brooke [name] MRN: [id number] Epilepsy Attending: Lora Havens Referring Physician/Provider: Dr Kerney Elbe Duration:  12/22/2020 1817 to 12/28/291 0945  Patient history: 86yo F with AMS, staring off. EEG to evaluate for seizure  Level of alertness:  lethargic, asleep  AEDs during EEG study: VPA  Technical aspects: This EEG study was done with scalp electrodes positioned  according to the 10-20 International system of electrode placement. Electrical activity was acquired at a sampling rate of 500Hz  and reviewed with a high frequency filter of 70Hz  and a low frequency filter of 1Hz . EEG data were recorded continuously and digitally stored.  Description: No posterior dominant rhythm was seen. Sleep was characterized by vertex waves, sleep spindles (12 to 14 Hz), maximal frontocentral region. EEG showed continuous generalized 3 to 6 Hz theta-delta slowing. Left centro- parietal spikes were noted. Hyperventilation and photic stimulation were not performed.    ABNORMALITY -Spike,  left centro- parietal region -Continuous slow, generalized  IMPRESSION: This study showed evidence of epileptogenicity arising from  left centro- parietal region as well as moderate diffuse encephalopathy, non specific etiology, but could be secondary to ictal/post-ictal state.  EEG appears similar to yesterday.  Priyanka    Scheduled Meds: . enoxaparin (LOVENOX) injection  40 mg Subcutaneous Q24H  . haloperidol  1 mg Oral BID  . mirtazapine  7.5 mg Oral QHS  . QUEtiapine  25 mg Oral BID  . triamcinolone cream  1 application Topical BID  . valproic acid  250 mg Oral TID   Continuous Infusions:   LOS: 0 days   Time spent: 35 minutes  Othmar Ringer , MD [facility]  If 7PM-7AM, please contact night-coverage [email or web address] 12/23/2020, 2:16 PM

## 2020-12-23 NOTE — ED Notes (Signed)
Patient removed PureWick and voided in bed. Patient and linens changed. PureWick reapplied and brief applied. Patient pulled up in bed. Patient voices feeling better, denies further needs.

## 2020-12-23 NOTE — Progress Notes (Signed)
vLTM EEG Complete. No skin breakdown

## 2020-12-23 NOTE — ED Notes (Signed)
Pharmacy sent message regarding missing dose of three 2200 medications.

## 2020-12-23 NOTE — Progress Notes (Signed)
LTM maint complete - no skin breakdown under: FP1,F7,F3,P7 pt still waiting for bed and not monitored by [facility] until she is in a room

## 2020-12-23 NOTE — Progress Notes (Signed)
EEG maintenance complete. No skin breakdown at FP1 FP2 F7 continue to monitor

## 2020-12-23 NOTE — ED Notes (Signed)
TELE Breakfast Ordered

## 2020-12-23 NOTE — Procedures (Signed)
Patient Name: Brooke [name]  MRN: [id number]  Epilepsy Attending: Charlsie Quest  Referring Physician/Provider: Dr Caryl Pina Duration:  12/22/2020 1817 to 12/28/291 0945  Patient history: 86yo F with AMS, staring off. EEG to evaluate for seizure  Level of alertness:  lethargic, asleep  AEDs during EEG study: VPA  Technical aspects: This EEG study was done with scalp electrodes positioned according to the 10-20 International system of electrode placement. Electrical activity was acquired at a sampling rate of 500Hz  and reviewed with a high frequency filter of 70Hz  and a low frequency filter of 1Hz . EEG data were recorded continuously and digitally stored.   Description: No posterior dominant rhythm was seen. Sleep was characterized by vertex waves, sleep spindles (12 to 14 Hz), maximal frontocentral region. EEG showed continuous generalized 3 to 6 Hz theta-delta slowing. Left centro- parietal spikes were noted. Hyperventilation and photic stimulation were not performed.     ABNORMALITY -Spike,  left centro- parietal region -Continuous slow, generalized  IMPRESSION: This study showed evidence of epileptogenicity arising from  left centro- parietal region as well as moderate diffuse encephalopathy, non specific etiology, but could be secondary to ictal/post-ictal state.   EEG appears similar to yesterday.  Muaad Boehning 

## 2020-12-23 NOTE — ED Notes (Signed)
Call the son Jeri Jeanbaptiste at [phone] with an update please

## 2020-12-23 NOTE — ED Notes (Signed)
Lunch Tray Ordered @ 1029. 

## 2020-12-24 LAB — CBC
HCT: 41.7 % (ref 36.0–46.0)
Hemoglobin: 13.8 g/dL (ref 12.0–15.0)
MCH: 30.9 pg (ref 26.0–34.0)
MCHC: 33.1 g/dL (ref 30.0–36.0)
MCV: 93.3 fL (ref 80.0–100.0)
Platelets: 205 10*3/uL (ref 150–400)
RBC: 4.47 MIL/uL (ref 3.87–5.11)
RDW: 12 % (ref 11.5–15.5)
WBC: 6.9 10*3/uL (ref 4.0–10.5)
nRBC: 0 % (ref 0.0–0.2)

## 2020-12-24 LAB — MAGNESIUM: Magnesium: 2.3 mg/dL (ref 1.7–2.4)

## 2020-12-24 MED ORDER — VALPROIC ACID 250 MG PO CAPS: 250.0000 mg | ORAL_CAPSULE | Freq: Three times a day (TID) | ORAL | 0 refills | Status: AC

## 2020-12-24 NOTE — ED Notes (Signed)
Lunch Tray Ordered @ 1038.  

## 2020-12-24 NOTE — Progress Notes (Signed)
Occupational Therapy Evaluation Patient Details Name: Brooke [name] MRN: [id number] DOB: [birth year] Today's Date: 12/24/2020    History of Present Illness history significant of on hospice for uterine mass, protein calorie malnutrition, Alzheimer's dz. , HTN.  brought to ED after episode of sudden unresponsiveness. EEG consistent with epileptogenicity arising from teh Lt centro-parietal region.   Clinical Impression   Pt seen with son present.  She currently requires mod - max A for bed mobility and functional transfers - she has a strong posterior lean, and was intermittently lethargic during eval.  SHe required max - total a for ADLs.  She lives at home with spouse and has hired caregivers during the day.  Son is eager to have pt discharge home.  Recommend w/c, 3in1 commode, and HHOT and PT.  All further needs deferred to [facility].     Follow Up Recommendations  Home health OT;Other (comment) (HHPT)    Equipment Recommendations  Wheelchair (measurements OT);3 in 1 bedside commode    Recommendations for Other Services       Precautions / Restrictions Precautions Precautions: Fall      Mobility Bed Mobility Overal bed mobility: Needs Assistance Bed Mobility: Supine to Sit;Sit to Supine     Supine to sit: Max assist Sit to supine: Max assist   General bed mobility comments: assist to move LEs off and onto ED stretcher and assist to lift and lower trunk    Transfers Overall transfer level: Needs assistance Equipment used: 1 person hand held assist Transfers: Sit to/from Stand;Stand Pivot Transfers Sit to Stand: Mod assist Stand pivot transfers: Max assist       General transfer comment: heavy posterior lean.  shuffling steps    Balance Overall balance assessment: Needs assistance Sitting-balance support: Feet supported;Single extremity supported Sitting balance-Leahy Scale: Poor Sitting balance - Comments: requires up to mod A Postural control: Posterior  lean Standing balance support: Single extremity supported Standing balance-Leahy Scale: Poor Standing balance comment: posterior bias                           ADL either performed or assessed with clinical judgement   ADL Overall ADL's : Needs assistance/impaired Eating/Feeding: Total assistance;Sitting   Grooming: Wash/dry hands;Wash/dry face;Maximal assistance;Sitting   Upper Body Bathing: Total assistance;Sitting   Lower Body Bathing: Total assistance;Sit to/from stand   Upper Body Dressing : Total assistance;Sitting   Lower Body Dressing: Total assistance;Sit to/from stand   Toilet Transfer: Maximal assistance;Stand-pivot;BSC   Toileting- Clothing Manipulation and Hygiene: Total assistance;Sit to/from stand       Functional mobility during ADLs: Maximal assistance       Vision   Additional Comments: Not formally assessed     Perception     Praxis      Pertinent Vitals/Pain Pain Assessment: Faces Pain Score: 0-No pain     Hand Dominance Right   Extremity/Trunk Assessment Upper Extremity Assessment Upper Extremity Assessment: Generalized weakness;RUE deficits/detail;LUE deficits/detail RUE Deficits / Details: tremulous bil. UEs   Lower Extremity Assessment Lower Extremity Assessment: Defer to PT evaluation   Cervical / Trunk Assessment Cervical / Trunk Assessment: Kyphotic   Communication     Cognition Arousal/Alertness: Awake/alert;Lethargic Behavior During Therapy: WFL for tasks assessed/performed Overall Cognitive Status: History of cognitive impairments - at baseline  General Comments  son present and is eager for pt to discharge home to avoid exposures.  VSS    Exercises     Shoulder Instructions      Home Living Family/patient expects to be discharged to:: Private residence Living Arrangements: Spouse/significant other Available Help at Discharge: Family;Personal care  attendant;Available 24 hours/day Type of Home: House Home Access: Level entry     Home Layout: One level     Bathroom Shower/Tub: Producer, television/film/video: Handicapped height Bathroom Accessibility: Yes   Home Equipment: Environmental consultant - 2 wheels;Shower seat;Grab bars - toilet;Grab bars - tub/shower   Additional Comments: Pt lives with spouse and has hired caregivers ~6 hours/day. son is also supportive and able to assist      Prior Functioning/Environment Level of Independence: Needs assistance  Gait / Transfers Assistance Needed: walks without AD ADL's / Homemaking Assistance Needed: Requires assist for bathing and dressing            OT Problem List: Decreased strength;Decreased activity tolerance;Impaired balance (sitting and/or standing);Decreased cognition;Decreased safety awareness;Decreased knowledge of use of DME or AE      OT Treatment/Interventions:      OT Goals(Current goals can be found in the care plan section) Acute Rehab OT Goals Patient Stated Goal: for pt to discharge home as soon as possible OT Goal Formulation: All assessment and education complete, DC therapy  OT Frequency:     Barriers to D/C:            Co-evaluation              AM-PAC OT "6 Clicks" Daily Activity     Outcome Measure Help from another person eating meals?: Total Help from another person taking care of personal grooming?: A Lot Help from another person toileting, which includes using toliet, bedpan, or urinal?: Total Help from another person bathing (including washing, rinsing, drying)?: Total Help from another person to put on and taking off regular upper body clothing?: Total Help from another person to put on and taking off regular lower body clothing?: Total 6 Click Score: 7   End of Session Equipment Utilized During Treatment: Gait belt Nurse Communication: Mobility status  Activity Tolerance: Patient tolerated treatment well;Patient limited by  lethargy Patient left: in bed;with call bell/phone within reach;with family/visitor present  OT Visit Diagnosis: Unsteadiness on feet (R26.81);Cognitive communication deficit (Z14.604)                Time: 7998-7215 OT Time Calculation (min): 25 min Charges:  OT General Charges $OT Visit: 1 Visit OT Evaluation $OT Eval Moderate Complexity: 1 Mod OT Treatments $Therapeutic Activity: 8-22 mins  Eber Jones., OTR/L Acute Rehabilitation Services Pager [phone] Office [phone]   Jeani Hawking M 12/24/2020, 1:03 PM

## 2020-12-24 NOTE — Evaluation (Signed)
Clinical/Bedside Swallow Evaluation Patient Details  Name: Brooke [name] MRN: [id number] Date of Birth: 07/27/1932  Today's Date: 12/24/2020 Time: SLP Start Time (ACUTE ONLY): 1251 SLP Stop Time (ACUTE ONLY): 1307 SLP Time Calculation (min) (ACUTE ONLY): 16 min  Past Medical History:  Past Medical History:  Diagnosis Date  . Arthritis    hands   . AVM (arteriovenous malformation)   . Cameron lesion, acute   . Cough   . Diverticulosis   . Diverticulosis   . GERD (gastroesophageal reflux disease)   . H/O: GI bleed   . H/O: hematuria   . Hiatal hernia   . History of colon polyps   . History of hypokalemia   . Hyperlipidemia   . Hypertension   . Internal hemorrhoid   . Iron deficiency anemia   . Osteoporosis   . Paraesophageal hernia   . Paraesophageal hernia    history of  . Praesophageal hiatal hernia - giant, irreducible 10/20/2011   Past Surgical History:  Past Surgical History:  Procedure Laterality Date  . CESAREAN SECTION  01/1965  . colonscopy    . DILATION AND CURETTAGE OF UTERUS    . HERNIA REPAIR  12/2011  . LAPAROSCOPIC NISSEN FUNDOPLICATION  [id number]   Procedure: LAPAROSCOPIC NISSEN FUNDOPLICATION;  Surgeon: Adin Hector, MD;  Location: [facility];  Service: General;  Laterality: N/A;  laparoscopic paraesphogeal hernia repair and fundoplication  . POLYPECTOMY     HPI:  86 y.o. female with medical history significant for hospice for uterine mass, protein calorie malnutrition, Alzheimer's dz, HTN. Brought to ED by EMS after episode of unresponsiveness.  Neurology consulted,  dx evidence of epileptogenicity per EEG.   Assessment / Plan / Recommendation Clinical Impression  Pt demonstrates functional oropharyngeal swallow with normal attention/anticipation of arriving bolus, adequate mastication, brisk swallow response, and no s/s of aspiration. No dysphagia.  Recommend continuing regular diet, thin liquids; no SLP f/u recommended. D/W son at bedside.  RN  reports improvement from this am. Pt much more alert/interactive than described from OT evaluation.  Our service will sign off. SLP Visit Diagnosis: Dysphagia, unspecified (R13.10)    Aspiration Risk  No limitations    Diet Recommendation   regular solids, thin liquids.  Medication Administration: Whole meds with liquid    Other  Recommendations Oral Care Recommendations: Oral care BID   Follow up Recommendations None      Frequency and Duration            Prognosis        Swallow Study   General HPI: 86 y.o. female with medical history significant for hospice for uterine mass, protein calorie malnutrition, Alzheimer's dz, HTN. Brought to ED by EMS after episode of unresponsiveness.  Neurology consulted,  dx evidence of epileptogenicity per EEG. Type of Study: Bedside Swallow Evaluation Previous Swallow Assessment: no Diet Prior to this Study: Regular;Thin liquids Temperature Spikes Noted: No Respiratory Status: Room air History of Recent Intubation: No Behavior/Cognition: Alert;Cooperative;Pleasant mood Oral Cavity Assessment: Within Functional Limits Oral Care Completed by SLP: No Oral Cavity - Dentition: Adequate natural dentition Vision: Functional for self-feeding Self-Feeding Abilities: Needs assist Patient Positioning: Upright in bed Baseline Vocal Quality: Normal Volitional Cough: Strong Volitional Swallow: Able to elicit    Oral/Motor/Sensory Function Overall Oral Motor/Sensory Function: Within functional limits   Ice Chips Ice chips: Not tested   Thin Liquid Thin Liquid: Within functional limits    Nectar Thick Nectar Thick Liquid: Not tested   Honey Thick Honey Thick  Liquid: Not tested   Puree Puree: Within functional limits   Solid     Solid: Within functional limits      Blenda Mounts Laurice 12/24/2020,1:14 PM  [name] Frederic, MA CCC/SLP Acute Rehabilitation Services Office number [phone] Pager [phone]

## 2020-12-24 NOTE — ED Notes (Signed)
Attempted to feed pt breakfast, pt continuously spits food out. Pt will not drink fluids at this time.

## 2020-12-24 NOTE — ED Notes (Signed)
Son is at bedside stating he wants to take pt home, Dr Gerri Lins is aware and is calling son

## 2020-12-24 NOTE — ED Notes (Signed)
Attempted to call report; nurse unavailable at this time.

## 2020-12-24 NOTE — Discharge Summary (Signed)
Physician Discharge Summary  Brooke [name] [id number] DOB: October 11, 1932 DOA: 12/22/2020  PCP: [place] date: 12/22/2020 Discharge date: 12/24/2020  Recommendations for Outpatient Follow-up:  1. Discharge to home with hospice and family  Discharge Diagnoses: Principal diagnosis is #1 1. Seizure disorder with positive epileptogenicity arising from the left centro-parietal region as well as diffuse encephalopathy possibly due to ictal/post-ictal stated.  2. Mild chronic ischemic white matter disease 3. Uterine mass 4. Alzheimer's Dementai  Discharge Condition: Fair  Disposition: Home with family and hospice  Diet recommendation: Heart healthy  Vitals:   12/24/20 1100 12/24/20 1300  BP: (!) 149/69 (!) 150/83  Pulse: 89 89  Resp: 19 (!) 27  Temp:    SpO2: 98% 98%    History of present illness: Brooke [name] is a 86 y.o. female with medical history significant of on hospice for uterine mass, protein calorie malnutrition, Alzheimer's dz.  Also HTN.  Pt brought in to ED by EMS after episode of sudden onset unresponsiveness noted by family.  Symptoms onset just PTA.  Son states that Brooke [name] had"Eyes wide like a ghost, staring off into space and not responding".  Lasted Minutes at home.  After EMS arrived, BP 208/114, pt unresponsive with snoring respirations.  Pt has no prior h/o seizure.  In the ED pt gradually returned to what I suspect is baseline mental status.  Now awake, demented, speaking in full sentences. Speech clear.  No focal deficits. CT head neg. Neurology evaluated.  EEG shows epileptiform discharges. Pt felt to have new onset seizure, started on VPA.  Hospital Course: [facility] were consulted to admit the patient for further evaluation and treatment. The patient was placed on seizure precautions. Neurology was consulted. EEG was performed. He was positive epileptogenicity arising from the left centro-parietal region  as well as diffuse encephalopathy possibly due to ictal/post-ictal state. She was loaded with Keppra and then started on Depakote.   On the morning of 12/24/2020 nursing reported to me that that patient was pocketing foods and spitting them back out that morning. I discussed this with the patient's son who stated that this was not normal for her and that she had taken applesauce and liquids from him that morning without difficulty. SLP was asked to evaluated the patient, and she had no difficulty with swallow or mastication.   The patient was cleared for discharge to home by neurology. She will be discharged to home and hospice this afternoon.  Today's assessment: S: The patient is resting comfortably. No new complaints.  O: Vitals:  Vitals:   12/24/20 1100 12/24/20 1300  BP: (!) 149/69 (!) 150/83  Pulse: 89 89  Resp: 19 (!) 27  Temp:    SpO2: 98% 98%    Exam:  Constitutional:  . The patient is awake, alert, and oriented x 3. No acute distress. Respiratory:  . No increased work of breathing. . No wheezes, rales, or rhonchi . No tactile fremitus Cardiovascular:  . Regular rate and rhythm . No murmurs, ectopy, or gallups. . No lateral PMI. No thrills. Abdomen:  . Abdomen is soft, non-tender, non-distended . No hernias, masses, or organomegaly . Normoactive bowel sounds.  Musculoskeletal:  . No cyanosis, clubbing, or edema Skin:  . No rashes, lesions, ulcers . palpation of skin: no induration or nodules Neurologic:  . CN 2-12 intact . Sensation all 4 extremities intact Psychiatric:  . Mental status o Mood, affect appropriate o Orientation to person, place, time  .  judgment and insight appear intact   Discharge Instructions  Discharge Instructions    Activity as tolerated - No restrictions   Complete by: As directed    Call MD for:  persistant nausea and vomiting   Complete by: As directed    Call MD for:  severe uncontrolled pain   Complete by: As directed     Diet - low sodium heart healthy   Complete by: As directed    Discharge instructions   Complete by: As directed    Discharge to home with family and hospice.   Increase activity slowly   Complete by: As directed      Allergies as of 12/24/2020      Reactions   Oxycontin [oxycodone Hcl] Other (See Comments)   Hallucinations; pt can take now (hospice list)      Medication List    TAKE these medications   bisacodyl 10 MG suppository Commonly known as: DULCOLAX Place 10 mg rectally daily as needed for mild constipation.   Cerefolin 05-27-49-5 MG Tabs Take 1 tablet by mouth in the morning.   haloperidol 0.5 MG tablet Commonly known as: HALDOL Take 1 mg by mouth See admin instructions. Takes 2 tablets (1 mg totally) by mouth 2 times daily; takes extra 1 tablet (0.5 mg) every 4 hours as needed for agitation   mirtazapine 7.5 MG tablet Commonly known as: REMERON Take 7.5 mg by mouth at bedtime.   oxyCODONE 5 MG immediate release tablet Commonly known as: Oxy IR/ROXICODONE Take 5 mg by mouth every 4 (four) hours as needed for severe pain.   QUEtiapine 25 MG tablet Commonly known as: SEROQUEL Take 25 mg by mouth 2 (two) times daily.   traZODone 50 MG tablet Commonly known as: DESYREL Take 50 mg by mouth at bedtime as needed.   triamcinolone cream 0.5 % Commonly known as: KENALOG Apply 1 application topically 2 (two) times daily.   valproic acid 250 MG capsule Commonly known as: DEPAKENE Take 1 capsule (250 mg total) by mouth 3 (three) times daily.      Allergies  Allergen Reactions  . Oxycontin [Oxycodone Hcl] Other (See Comments)    Hallucinations; pt can take now (hospice list)    The results of significant diagnostics from this hospitalization (including imaging, microbiology, ancillary and laboratory) are listed below for reference.    Significant Diagnostic Studies: EEG  Result Date: 12/22/2020 Lora Havens, MD     12/22/2020  6:17 PM Patient Name:  Brooke [name] MRN: [id number] Epilepsy Attending: Lora Havens Referring Physician/Provider: Dr Kerney Elbe Date: 12/22/2020 Duration: 26.19 mins Patient history: 86yo F with AMS, staring off. EEG to evaluate for seizure Level of alertness:  lethargic AEDs during EEG study: None Technical aspects: This EEG study was done with scalp electrodes positioned according to the 10-20 International system of electrode placement. Electrical activity was acquired at a sampling rate of 500Hz  and reviewed with a high frequency filter of 70Hz  and a low frequency filter of 1Hz . EEG data were recorded continuously and digitally stored. Description: No posterior dominant rhythm was seen. EEG showed continuous generalized 3 to 6 Hz theta-delta slowing. Frequent left centro- parietal spikes were noted. Patient was also noted to have right hand rhythmic jerking. Concomitant eeg before, during and after the event didn't show any eeg change.  Hyperventilation and photic stimulation were not performed.   ABNORMALITY -Spike,  left centro- parietal region -Continuous slow, generalized IMPRESSION: This study showed evidence of epileptogenicity arising from  left centro- parietal region. Patient was also noted to have right hand rhythmic jerking without concomitant eeg change. However, focal motor seizures may not be seen on scalp eeg. Clinical correlation is recommended. Additionally, there is moderate diffuse26 encephalopathy, non specific etiology, but could be secondary to ictal/post-ictal state. Dr Cheral Marker was notified Lora Havens   CT Head Wo Contrast  Result Date: 12/22/2020 CLINICAL DATA:  Altered mental status. EXAM: CT HEAD WITHOUT CONTRAST TECHNIQUE: Contiguous axial images were obtained from the base of the skull through the vertex without intravenous contrast. COMPARISON:  January 18, 2012. FINDINGS: Brain: Mild chronic ischemic white matter disease is noted. No mass effect or midline shift is noted. Ventricular  size is within normal limits. There is no evidence of mass lesion, hemorrhage or acute infarction. Vascular: No hyperdense vessel or unexpected calcification. Skull: Normal. Negative for fracture or focal lesion. Sinuses/Orbits: No acute finding. Other: None. IMPRESSION: Mild chronic ischemic white matter disease. No acute intracranial abnormality seen. Electronically Signed   By: Marijo Conception M.D.   On: 12/22/2020 15:42   DG CHEST PORT 1 VIEW  Result Date: 12/22/2020 CLINICAL DATA:  Altered mental status. EXAM: PORTABLE CHEST 1 VIEW COMPARISON:  Chest x-ray dated 10/03/2019 FINDINGS: Heart size and mediastinal contours are stable. Lungs are clear. No pleural effusion or pneumothorax is seen. Osseous structures about the chest are unremarkable. IMPRESSION: No active disease.  No evidence of pneumonia or pulmonary edema. Electronically Signed   By: Franki Cabot M.D.   On: 12/22/2020 19:50   Overnight EEG with video  Result Date: 12/23/2020 Lora Havens, MD     12/23/2020  9:48 AM Patient Name: Brooke [name] MRN: [id number] Epilepsy Attending: Lora Havens Referring Physician/Provider: Dr Kerney Elbe Duration:  12/22/2020 1817 to 12/28/291 0945  Patient history: 86yo F with AMS, staring off. EEG to evaluate for seizure  Level of alertness:  lethargic, asleep  AEDs during EEG study: VPA  Technical aspects: This EEG study was done with scalp electrodes positioned according to the 10-20 International system of electrode placement. Electrical activity was acquired at a sampling rate of 500Hz  and reviewed with a high frequency filter of 70Hz  and a low frequency filter of 1Hz . EEG data were recorded continuously and digitally stored.  Description: No posterior dominant rhythm was seen. Sleep was characterized by vertex waves, sleep spindles (12 to 14 Hz), maximal frontocentral region. EEG showed continuous generalized 3 to 6 Hz theta-delta slowing. Left centro- parietal spikes were noted.  Hyperventilation and photic stimulation were not performed.    ABNORMALITY -Spike,  left centro- parietal region -Continuous slow, generalized  IMPRESSION: This study showed evidence of epileptogenicity arising from  left centro- parietal region as well as moderate diffuse encephalopathy, non specific etiology, but could be secondary to ictal/post-ictal state.  EEG appears similar to yesterday. Lora Havens    Microbiology: Recent Results (from the past 240 hour(s))  SARS CORONAVIRUS 2 (TAT 6-24 HRS) Nasopharyngeal Nasopharyngeal Swab     Status: None   Collection Time: 12/22/20  7:21 PM   Specimen: Nasopharyngeal Swab  Result Value Ref Range Status   SARS Coronavirus 2 NEGATIVE NEGATIVE Final    Comment: (NOTE) SARS-CoV-2 target nucleic acids are NOT DETECTED.  The SARS-CoV-2 RNA is generally detectable in upper and lower respiratory specimens during the acute phase of infection. Negative results do not preclude SARS-CoV-2 infection, do not rule out co-infections with other pathogens, and should not be used as the sole basis  for treatment or other patient management decisions. Negative results must be combined with clinical observations, patient history, and epidemiological information. The expected result is Negative.  Fact Sheet for Patients: HairSlick.no  Fact Sheet for Healthcare Providers: quierodirigir.com  This test is not yet approved or cleared by the [place] FDA and  has been authorized for detection and/or diagnosis of SARS-CoV-2 by FDA under an Emergency Use Authorization (EUA). This EUA will remain  in effect (meaning this test can be used) for the duration of the COVID-19 declaration under Se ction 564(b)(1) of the Act, 21 U.S.C. section 360bbb-3(b)(1), unless the authorization is terminated or revoked sooner.  Performed at [facility], [address], [place], [place] [address]       Labs: Basic Metabolic Panel: Recent Labs  Lab 12/22/20 1443 12/22/20 2000 12/23/20 0501 12/24/20 0413  NA 139  --  139  --   K 4.2  --  3.3*  --   CL 105  --  101  --   CO2 25  --  26  --   GLUCOSE 165*  --  96  --   BUN 16  --  9  --   CREATININE 0.90  --  0.88  --   CALCIUM 8.7*  --  8.7*  --   MG  --  1.9  --  2.3   Liver Function Tests: Recent Labs  Lab 12/22/20 1443  AST 21  ALT 13  ALKPHOS 26*  BILITOT 0.7  PROT 6.2*  ALBUMIN 3.2*   No results for input(s): LIPASE, AMYLASE in the last 168 hours. Recent Labs  Lab 12/22/20 2000  AMMONIA 15   CBC: Recent Labs  Lab 12/22/20 1443 12/23/20 0501 12/24/20 0413  WBC 8.4 10.6* 6.9  NEUTROABS 6.8  --   --   HGB 12.6 13.6 13.8  HCT 36.0 40.7 41.7  MCV 93.0 91.5 93.3  PLT 191 218 205   Cardiac Enzymes: No results for input(s): CKTOTAL, CKMB, CKMBINDEX, TROPONINI in the last 168 hours. BNP: BNP (last 3 results) No results for input(s): BNP in the last 8760 hours.  ProBNP (last 3 results) No results for input(s): PROBNP in the last 8760 hours.  CBG: Recent Labs  Lab 12/22/20 1354  GLUCAP 158*    Principal Problem:   Seizure (HCC) Active Problems:   Essential hypertension   Alzheimer's dementia (HCC)   Uterine mass   Hospice care patient   Time coordinating discharge: 38 minutes Signed:        Aryssa Rosamond, DO [facility]  12/24/2020, 1:37 PM

## 2020-12-25 DIAGNOSIS — G40909 Epilepsy, unspecified, not intractable, without status epilepticus: Secondary | ICD-10-CM | POA: Diagnosis not present

## 2020-12-25 DIAGNOSIS — E440 Moderate protein-calorie malnutrition: Secondary | ICD-10-CM | POA: Diagnosis not present

## 2020-12-25 DIAGNOSIS — N858 Other specified noninflammatory disorders of uterus: Secondary | ICD-10-CM | POA: Diagnosis not present

## 2020-12-25 DIAGNOSIS — R1310 Dysphagia, unspecified: Secondary | ICD-10-CM | POA: Diagnosis not present

## 2020-12-25 DIAGNOSIS — K7689 Other specified diseases of liver: Secondary | ICD-10-CM | POA: Diagnosis not present

## 2020-12-25 DIAGNOSIS — R634 Abnormal weight loss: Secondary | ICD-10-CM | POA: Diagnosis not present

## 2020-12-27 DIAGNOSIS — I10 Essential (primary) hypertension: Secondary | ICD-10-CM | POA: Diagnosis not present

## 2020-12-27 DIAGNOSIS — M810 Age-related osteoporosis without current pathological fracture: Secondary | ICD-10-CM | POA: Diagnosis not present

## 2020-12-27 DIAGNOSIS — N858 Other specified noninflammatory disorders of uterus: Secondary | ICD-10-CM | POA: Diagnosis not present

## 2020-12-27 DIAGNOSIS — G301 Alzheimer's disease with late onset: Secondary | ICD-10-CM | POA: Diagnosis not present

## 2020-12-27 DIAGNOSIS — E440 Moderate protein-calorie malnutrition: Secondary | ICD-10-CM | POA: Diagnosis not present

## 2020-12-27 DIAGNOSIS — K219 Gastro-esophageal reflux disease without esophagitis: Secondary | ICD-10-CM | POA: Diagnosis not present

## 2020-12-27 DIAGNOSIS — R1310 Dysphagia, unspecified: Secondary | ICD-10-CM | POA: Diagnosis not present

## 2020-12-27 DIAGNOSIS — F0280 Dementia in other diseases classified elsewhere without behavioral disturbance: Secondary | ICD-10-CM | POA: Diagnosis not present

## 2020-12-27 DIAGNOSIS — R634 Abnormal weight loss: Secondary | ICD-10-CM | POA: Diagnosis not present

## 2020-12-27 DIAGNOSIS — M1990 Unspecified osteoarthritis, unspecified site: Secondary | ICD-10-CM | POA: Diagnosis not present

## 2020-12-27 DIAGNOSIS — F0281 Dementia in other diseases classified elsewhere with behavioral disturbance: Secondary | ICD-10-CM | POA: Diagnosis not present

## 2020-12-27 DIAGNOSIS — G40909 Epilepsy, unspecified, not intractable, without status epilepticus: Secondary | ICD-10-CM | POA: Diagnosis not present

## 2020-12-27 DIAGNOSIS — K7689 Other specified diseases of liver: Secondary | ICD-10-CM | POA: Diagnosis not present

## 2020-12-30 DIAGNOSIS — R1310 Dysphagia, unspecified: Secondary | ICD-10-CM | POA: Diagnosis not present

## 2020-12-30 DIAGNOSIS — N858 Other specified noninflammatory disorders of uterus: Secondary | ICD-10-CM | POA: Diagnosis not present

## 2020-12-30 DIAGNOSIS — E440 Moderate protein-calorie malnutrition: Secondary | ICD-10-CM | POA: Diagnosis not present

## 2020-12-30 DIAGNOSIS — G40909 Epilepsy, unspecified, not intractable, without status epilepticus: Secondary | ICD-10-CM | POA: Diagnosis not present

## 2020-12-30 DIAGNOSIS — R634 Abnormal weight loss: Secondary | ICD-10-CM | POA: Diagnosis not present

## 2020-12-30 DIAGNOSIS — K7689 Other specified diseases of liver: Secondary | ICD-10-CM | POA: Diagnosis not present

## 2020-12-31 ENCOUNTER — Other Ambulatory Visit: Payer: Self-pay | Admitting: Adult Health

## 2021-01-03 DIAGNOSIS — G40909 Epilepsy, unspecified, not intractable, without status epilepticus: Secondary | ICD-10-CM | POA: Diagnosis not present

## 2021-01-03 DIAGNOSIS — R634 Abnormal weight loss: Secondary | ICD-10-CM | POA: Diagnosis not present

## 2021-01-03 DIAGNOSIS — K7689 Other specified diseases of liver: Secondary | ICD-10-CM | POA: Diagnosis not present

## 2021-01-03 DIAGNOSIS — E440 Moderate protein-calorie malnutrition: Secondary | ICD-10-CM | POA: Diagnosis not present

## 2021-01-03 DIAGNOSIS — R1310 Dysphagia, unspecified: Secondary | ICD-10-CM | POA: Diagnosis not present

## 2021-01-03 DIAGNOSIS — N858 Other specified noninflammatory disorders of uterus: Secondary | ICD-10-CM | POA: Diagnosis not present

## 2021-01-05 DIAGNOSIS — N858 Other specified noninflammatory disorders of uterus: Secondary | ICD-10-CM | POA: Diagnosis not present

## 2021-01-05 DIAGNOSIS — K7689 Other specified diseases of liver: Secondary | ICD-10-CM | POA: Diagnosis not present

## 2021-01-05 DIAGNOSIS — E440 Moderate protein-calorie malnutrition: Secondary | ICD-10-CM | POA: Diagnosis not present

## 2021-01-05 DIAGNOSIS — R634 Abnormal weight loss: Secondary | ICD-10-CM | POA: Diagnosis not present

## 2021-01-05 DIAGNOSIS — G40909 Epilepsy, unspecified, not intractable, without status epilepticus: Secondary | ICD-10-CM | POA: Diagnosis not present

## 2021-01-05 DIAGNOSIS — R1310 Dysphagia, unspecified: Secondary | ICD-10-CM | POA: Diagnosis not present

## 2021-01-07 DIAGNOSIS — R634 Abnormal weight loss: Secondary | ICD-10-CM | POA: Diagnosis not present

## 2021-01-07 DIAGNOSIS — G40909 Epilepsy, unspecified, not intractable, without status epilepticus: Secondary | ICD-10-CM | POA: Diagnosis not present

## 2021-01-07 DIAGNOSIS — E440 Moderate protein-calorie malnutrition: Secondary | ICD-10-CM | POA: Diagnosis not present

## 2021-01-07 DIAGNOSIS — K7689 Other specified diseases of liver: Secondary | ICD-10-CM | POA: Diagnosis not present

## 2021-01-07 DIAGNOSIS — R1310 Dysphagia, unspecified: Secondary | ICD-10-CM | POA: Diagnosis not present

## 2021-01-07 DIAGNOSIS — N858 Other specified noninflammatory disorders of uterus: Secondary | ICD-10-CM | POA: Diagnosis not present

## 2021-01-09 DIAGNOSIS — K7689 Other specified diseases of liver: Secondary | ICD-10-CM | POA: Diagnosis not present

## 2021-01-09 DIAGNOSIS — R634 Abnormal weight loss: Secondary | ICD-10-CM | POA: Diagnosis not present

## 2021-01-09 DIAGNOSIS — N858 Other specified noninflammatory disorders of uterus: Secondary | ICD-10-CM | POA: Diagnosis not present

## 2021-01-09 DIAGNOSIS — R1310 Dysphagia, unspecified: Secondary | ICD-10-CM | POA: Diagnosis not present

## 2021-01-09 DIAGNOSIS — G40909 Epilepsy, unspecified, not intractable, without status epilepticus: Secondary | ICD-10-CM | POA: Diagnosis not present

## 2021-01-09 DIAGNOSIS — E440 Moderate protein-calorie malnutrition: Secondary | ICD-10-CM | POA: Diagnosis not present

## 2021-01-13 DIAGNOSIS — R404 Transient alteration of awareness: Secondary | ICD-10-CM | POA: Diagnosis not present

## 2021-01-27 DIAGNOSIS — 419620001 Death: Secondary | SNOMED CT | POA: Diagnosis not present

## 2021-01-27 DEATH — deceased
